# Patient Record
Sex: Female | Born: 1990
Health system: Southern US, Community
[De-identification: ages and names within clinical notes are randomized; demographics above are authoritative.]

## PROBLEM LIST (undated history)

## (undated) ENCOUNTER — Inpatient Hospital Stay: Payer: Self-pay

## (undated) DIAGNOSIS — O24419 Gestational diabetes mellitus in pregnancy, unspecified control: Secondary | ICD-10-CM

## (undated) DIAGNOSIS — I10 Essential (primary) hypertension: Secondary | ICD-10-CM

## (undated) DIAGNOSIS — Z973 Presence of spectacles and contact lenses: Secondary | ICD-10-CM

## (undated) DIAGNOSIS — F329 Major depressive disorder, single episode, unspecified: Secondary | ICD-10-CM

## (undated) DIAGNOSIS — O139 Gestational [pregnancy-induced] hypertension without significant proteinuria, unspecified trimester: Secondary | ICD-10-CM

## (undated) DIAGNOSIS — N201 Calculus of ureter: Secondary | ICD-10-CM

## (undated) DIAGNOSIS — F411 Generalized anxiety disorder: Secondary | ICD-10-CM

## (undated) DIAGNOSIS — F419 Anxiety disorder, unspecified: Secondary | ICD-10-CM

## (undated) DIAGNOSIS — Z8759 Personal history of other complications of pregnancy, childbirth and the puerperium: Secondary | ICD-10-CM

## (undated) DIAGNOSIS — Z87442 Personal history of urinary calculi: Secondary | ICD-10-CM

## (undated) DIAGNOSIS — Z87440 Personal history of urinary (tract) infections: Secondary | ICD-10-CM

## (undated) DIAGNOSIS — Z8619 Personal history of other infectious and parasitic diseases: Secondary | ICD-10-CM

## (undated) DIAGNOSIS — N200 Calculus of kidney: Secondary | ICD-10-CM

## (undated) DIAGNOSIS — E559 Vitamin D deficiency, unspecified: Secondary | ICD-10-CM

## (undated) DIAGNOSIS — G43909 Migraine, unspecified, not intractable, without status migrainosus: Secondary | ICD-10-CM

## (undated) DIAGNOSIS — R1032 Left lower quadrant pain: Secondary | ICD-10-CM

## (undated) HISTORY — DX: Anxiety disorder, unspecified: F41.9

## (undated) HISTORY — DX: Calculus of kidney: N20.0

## (undated) HISTORY — DX: Personal history of urinary (tract) infections: Z87.440

## (undated) HISTORY — DX: Left lower quadrant pain: R10.32

## (undated) HISTORY — DX: Personal history of other infectious and parasitic diseases: Z86.19

---
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
## 2012-01-06 ENCOUNTER — Ambulatory Visit: Payer: Self-pay | Admitting: Unknown Physician Specialty

## 2012-01-17 ENCOUNTER — Ambulatory Visit: Payer: Self-pay | Admitting: Unknown Physician Specialty

## 2012-01-17 HISTORY — PX: CYST EXCISION: SHX5701

## 2012-01-17 LAB — PREGNANCY, URINE: Pregnancy Test, Urine: NEGATIVE m[IU]/mL

## 2013-05-02 IMAGING — CT CT NECK WITH CONTRAST
1 of 2 series · 9 of 14 positions shown, 12 images · non-contrast
Comparison: none

REASON FOR EXAM: neck mass
COMMENTS:

[Series 2: neck 3.0 3 · axial · 0.47mm/px · z∈[+552,+768]mm · 9 of 91 slices shown, 12 images]
[im 10/91  soft-tissue]
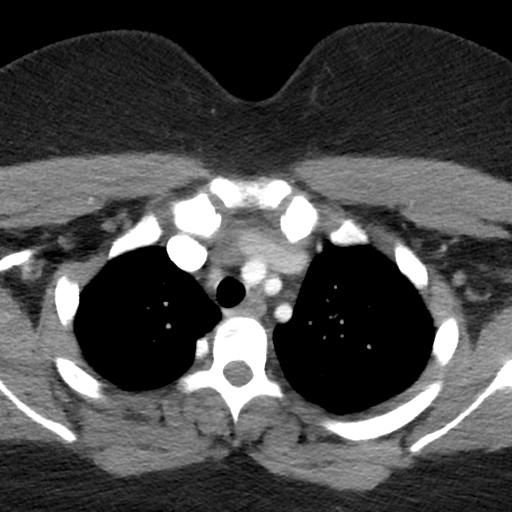
[im 10/91  bone]
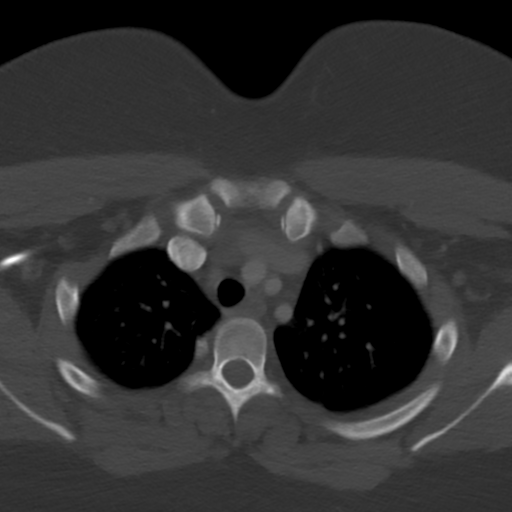
[im 19/91  bone]
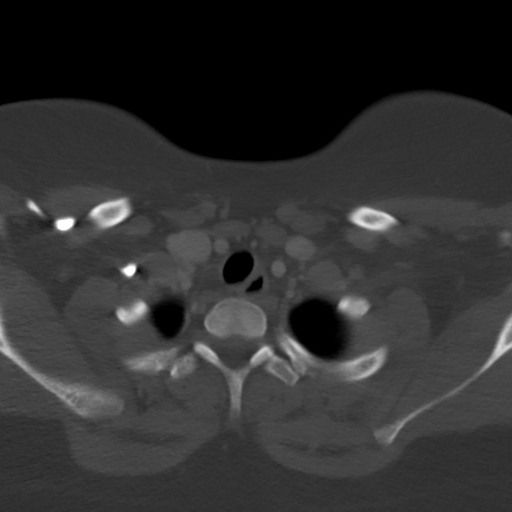
[im 28/91  bone]
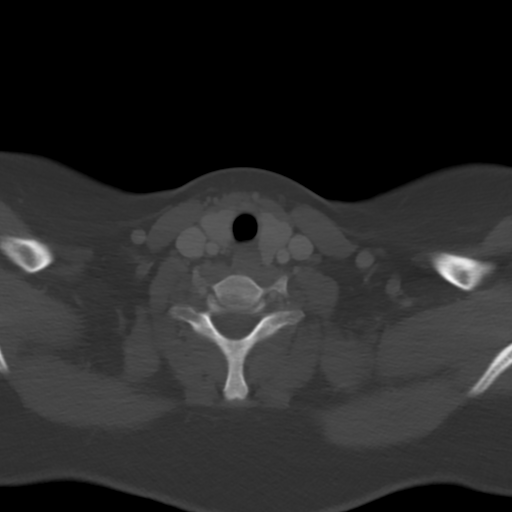
[im 37/91  bone]
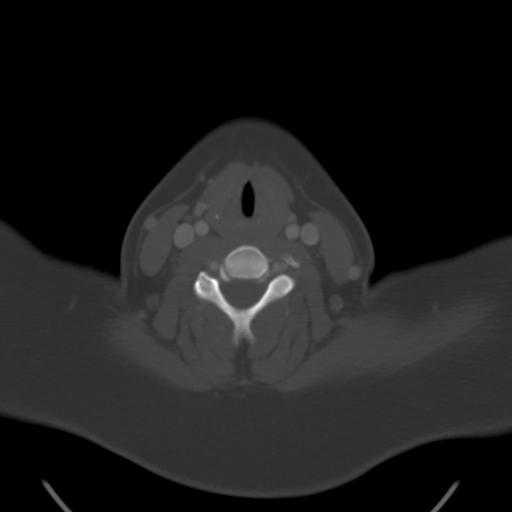
[im 46/91  soft-tissue]
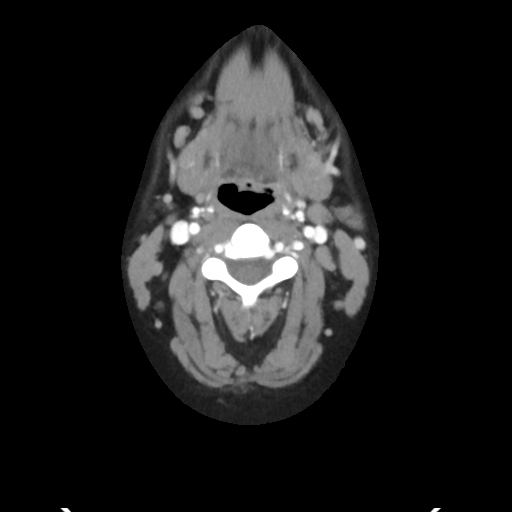
[im 46/91  bone]
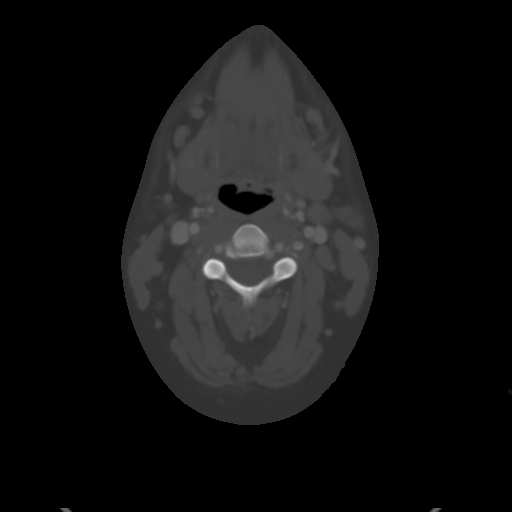
[im 55/91  bone]
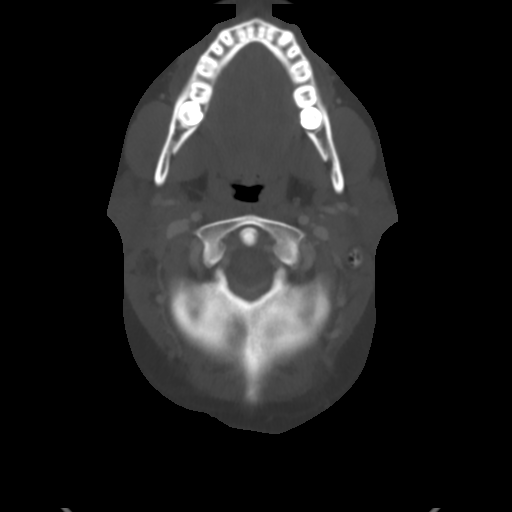
[im 64/91  bone]
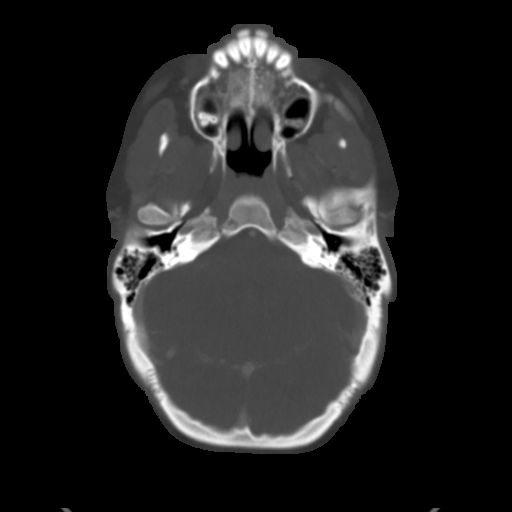
[im 73/91  bone]
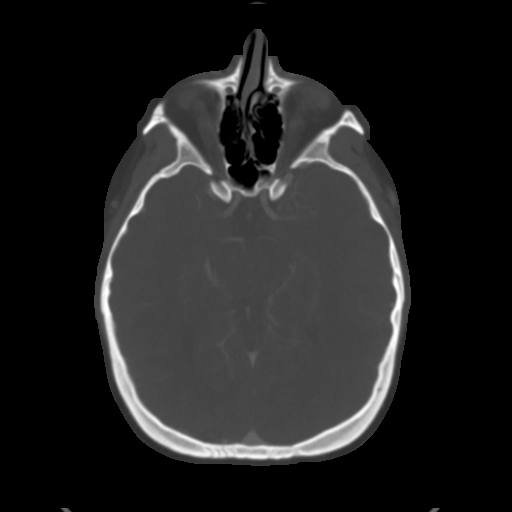
[im 82/91  soft-tissue]
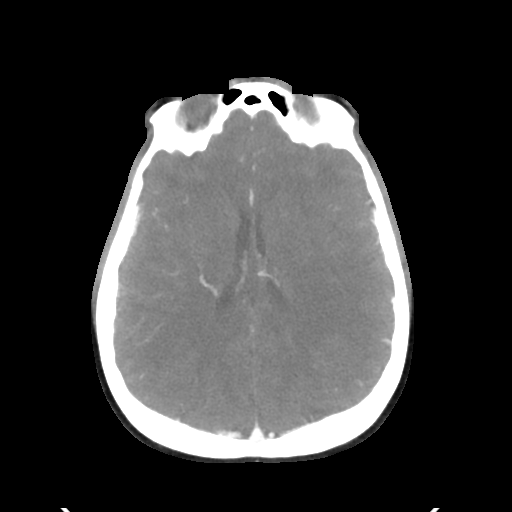
[im 82/91  bone]
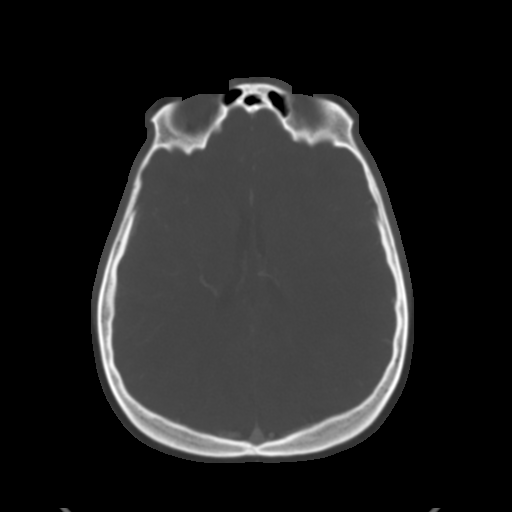

[9 of 14 positions shown; findings below may reference images not displayed]

PROCEDURE:     KCT - KCT NECK WITH CONTRAST  - January 06, 2012  [DATE]

RESULT:     Axial CT scanning was performed through the neck with
reconstructions at 3 mm intervals and slice thicknesses. The patient
received 75 cc of [id number]. Review of multiplanar reconstructed images was
performed separately on the VIA monitor.

A metallic BB has been placed over the palpable nodule of the anterior
aspect of the neck. The nodule exhibit soft tissue density and measures
cm in AP and transverse dimension and measures 1.3 cm in longitudinal
dimension. It is separated from the underlying strap muscles by a fatty
cleft. I do not see large feeding vessels. There is no internal
calcification. It has Hounsfield measurement of 25. It appears to abut the
skin surface over its midportion.

Elsewhere within the neck there are a few normal sized to borderline
enlarged lymph nodes. These are deep to the sternocleidomastoid muscle as
well as anterior and posterior to the jugular and carotid vessels. There are
also borderline enlarged to mildly enlarged submandibular lymph nodes. The
parotid and submandibular glands are normal in appearance. The thyroid lobes
are normal in density and symmetric in size. The laryngeal structures also
are normal in appearance.

At bone window settings the paranasal sinuses as well as nasal passages and
oropharynx are grossly normal. The prevertebral soft tissue spaces appear
normal. The cervical vertebral bodies are preserved in height with
normal-appearing surrounding soft tissues. The pulmonary apices exhibit
patchy increased interstitial densities especially on the left.
IMPRESSION: 1. Anterior to the larynx there is a soft tissue density nodule in the
subcutaneous fat which reaches the skin surface It is separated from the
deeper soft tissues of the neck by a fatty cleft. This likely reflects an
enlarged lymph node. It contains no calcification or gas nor does it appear
to significantly enhance.
2. There are numerous borderline to mildly enlarged cervical and
submandibular lymph nodes.
3. There are patchy interstitial densities within the upper lobes of both
lungs especially on the left.

## 2015-04-19 NOTE — Op Note (Signed)
PATIENT NAME:  Rhonda Davies, Rhonda Davies MR#:  [id number] DATE OF BIRTH:  May 23, 1991  DATE OF PROCEDURE:  01/17/2012  PREOPERATIVE DIAGNOSIS: Anterior epidermal inclusion cyst.   POSTOPERATIVE DIAGNOSIS: Anterior epidermal inclusion cyst.   OPERATION PERFORMED: Excision of anterior neck cyst.   SURGEON: Davina Pokehapman T. Vee Bahe, MD    OPERATIVE FINDINGS: Approximately 2 x 2 cm epidermal inclusion cyst, anterior neck.   DESCRIPTION OF PROCEDURE: [name] was identified in the holding area, taken to the operating room, and placed in the supine position. After general laryngeal mask anesthesia, the neck was gently extended. There was one small pit over the cyst which appeared to be the inclusion or the opening to the skin. A small elliptical incision was made around this pit and on each side. Local anesthetic of 1% lidocaine with 1:100,000 units of epinephrine was then used to inject along this incision line. A total of 1.5 mL was used. With the neck prepped and draped sterilely, a 15 blade was used to incise into the subcutaneous tissues ellipsing out the small center pit. The subcutaneous tissues were then dissected using the microbipolar. This appeared to be an obvious epidermal inclusion cyst as it was attached to the skin. The cyst was dissected in its entirety around the entire cyst down into the subcutaneous fat and it was removed in its entirety. With the cyst removed, the wound was copiously irrigated. The cyst measured approximately 2 x 2 cm. With no active bleeding, the subcutaneous tissues were closed using 4-0 Vicryl and the skin was closed using Dermabond. The patient was then returned to anesthesia where she was extubated in the operating room and taken to the recovery room in stable condition.   CULTURES: None.   SPECIMENS: Epidermal inclusion cyst.   ESTIMATED BLOOD LOSS: Less than 5 mL. ____________________________ Davina Pokehapman T. Mackenze Grandison, MD ctm:drc D: 01/17/2012 09:39:09 ET T: 01/17/2012 10:39:06  ET JOB#: [id number] Sibyl ParrHAPMAN T Kathlean Cinco MD ELECTRONICALLY SIGNED 02/07/2012 12:41

## 2015-05-27 ENCOUNTER — Telehealth: Payer: Self-pay | Admitting: Obstetrics and Gynecology

## 2015-05-27 NOTE — Telephone Encounter (Signed)
PT CALLED AND SHE WAS HERE ABOUT 3 MONTHS AGO FOR THE NEXPLANON AND SHE HAS GAINED OVER 10 POUNDS SINCE THEN AND SHE IS HAVING BAD HEADACHES.Marland Kitchen.HEADACHES STARTED ABOUT 2 WEEKS AGO.swelling of the ankles BAD SHE CANT GO TO WORK..DOESNT KNOW IF THIS IS RELATED TO THE NEXPLANON OR NOT AND WANTED TO KNOW IF SHE NEEDED TO COME.the patient WOULD LIKE A CALL BACK [phone]

## 2015-05-28 NOTE — Telephone Encounter (Signed)
LM for pt informing her that the symptoms she is experiencing do not sound typical of a Nexplanon especially the ankle swelling. Advised pt that I would recommend her seeing PCP for the swelling. However advised pt to call back here to the office for further discussion.

## 2015-06-05 ENCOUNTER — Encounter: Payer: Self-pay | Admitting: Obstetrics and Gynecology

## 2015-06-05 ENCOUNTER — Ambulatory Visit (INDEPENDENT_AMBULATORY_CARE_PROVIDER_SITE_OTHER): Payer: 59 | Admitting: Obstetrics and Gynecology

## 2015-06-05 VITALS — BP 106/73 | HR 97 | Ht 64.0 in | Wt 210.7 lb

## 2015-06-05 DIAGNOSIS — R638 Other symptoms and signs concerning food and fluid intake: Secondary | ICD-10-CM | POA: Diagnosis not present

## 2015-06-05 DIAGNOSIS — Z3042 Encounter for surveillance of injectable contraceptive: Secondary | ICD-10-CM

## 2015-06-05 DIAGNOSIS — N938 Other specified abnormal uterine and vaginal bleeding: Secondary | ICD-10-CM | POA: Insufficient documentation

## 2015-06-05 NOTE — Progress Notes (Signed)
Patient ID: Rhonda Davies, female   DOB: November 28, 1991, 24 y.o.   MRN: [id number]   PT C/O OF H/A AND 15 LB WT GAIN SINCE NEXPLANON WAS INSERTED 02/2015. WANTS IT REMOVED TODAY. NO BIRTH CONTROL DESIRED AT THIS TIME.   GYN ENCOUNTER NOTE  Subjective:       Rhonda Davies is a 24 y.o. No obstetric history on file. female is here for gynecologic evaluation of the following issues:  1.  Medication side effects. 2.  Dysfunctional uterine bleeding. 3.  Contraception  The patient desires Nexplanon removal today.  She has had exacerbation of her migraines and development of dysfunctional uterine bleeding.  The patient is not interested in any active contraceptive method at this time; she plans on abstaining at present.    Gynecologic History Patient's last menstrual period was 03/13/2015 (approximate). Contraception: Nexplanon  Obstetric History OB History  No data available    History reviewed. No pertinent past medical history.  Past Surgical History  Procedure Laterality Date  . Tonsillectomy and adenoidectomy  2007  . Cyst removed  2013    IN NECK    No current outpatient prescriptions on file prior to visit.   No current facility-administered medications on file prior to visit.    Allergies  Allergen Reactions  . Benzonatate Swelling    History   Social History  . Marital Status: Single    Spouse Name: N/A  . Number of Children: N/A  . Years of Education: N/A   Occupational History  . Not on file.   Social History Main Topics  . Smoking status: Never Smoker   . Smokeless tobacco: Not on file  . Alcohol Use: 0.6 oz/week    1 Standard drinks or equivalent per week  . Drug Use: No  . Sexual Activity:    Partners: Male   Other Topics Concern  . Not on file   Social History Narrative  . No narrative on file    Family History  Problem Relation Age of Onset  . Heart disease Father   . Diabetes Maternal Aunt   . Heart disease Maternal Grandfather   .  Diabetes Maternal Grandfather   . Diabetes Paternal Grandmother   . Heart disease Paternal Grandfather   . Breast cancer Neg Hx   . Colon cancer Neg Hx   . Ovarian cancer Neg Hx     The following portions of the patient's history were reviewed and updated as appropriate: allergies, current medications, past family history, past medical history, past social history, past surgical history and problem list.  Review of Systems Review of Systems - General ROS: positive for  - worsening migraine headaches Review of Systems - General ROS: negative for - chills, fatigue, fever, hot flashes, malaise or night sweats Hematological and Lymphatic ROS: negative for  swollen lymph nodes Gastrointestinal ROS: negative for - abdominal pain, blood in stools, change in bowel habits and nausea/vomiting Musculoskeletal ROS: negative for - joint pain, muscle pain or muscular weakness Genito-Urinary ROS: negative for dysmenorrhea, dyspareunia, dysuria, genital discharge, genital ulcers, hematuria, incontinencenocturia or pelvic pain; POSITIVE for irregular bleeding.  Objective:   BP 106/73 mmHg  Pulse 97  Ht 5\' 4"  (1.626 m)  Wt 210 lb 11.2 oz (95.573 kg)  BMI 36.15 kg/m2  LMP 03/13/2015 (Approximate) CONSTITUTIONAL: Well-developed, well-nourished female in no acute distress.  HENT:  Normocephalic, atraumatic.   SKIN: Skin is warm and dry. No rash noted. Not diaphoretic. No erythema. No pallor. NEUROLGIC: Alert and  oriented to person, place, and time. PSYCHIATRIC: Normal mood and affect. Normal behavior. Normal judgment and thought content. CARDIOVASCULAR:Not Examined RESPIRATORY: Not Examined BREASTS: Not Examined ABDOMEN: Soft, non distended; Non tender.  No Organomegaly. PELVIC: Not done. MUSCULOSKELETAL: Normal range of motion. No tenderness.  No cyanosis, clubbing, or edema.  PROCEDURE: NEXPLANON Removal - The patient with was counseled regarding the procedure and gives informed consent.The left  arm is flexed and extended on the exam table.  Betadine prep of the medial surface of the arm completed.  1% lidocaine local anesthetic is infiltrated at the palpable Rod site. A 5 mm incision is made at the distal end of the rod margin.  Hemostat is used to grasp the Nexplanon device.Scar tissue is taken down with the scalpel followed by removal of the Nexplanon.  Minimal bleeding is encountered.  Telfa and pressure dressing is applied for 24 hours.  Wound care instructions are given.  EBL minimal.  Complications none.    Assessment:   1. Increased BMI  2. Dysfunctional uterine bleeding  3.  Encounter for contraception management      Plan:  1. Nexplanon is removed - See Procedure note. 2. Tylenol/Advil prn. 3. Barrier method of contraception is encouraged. 4. Local wound care instructions given. 5. Return in 4 weeks for f/u with Dr Valentino Saxon.

## 2015-06-05 NOTE — Patient Instructions (Addendum)
Plan: 1.  Follow-up in 4 weeks with Dr. Valentino Saxon regarding contraception and DUB 2.  Keep the arm wrapped and covered 24 hours. 3.  Tylenol/Advil as needed for discomfort.

## 2015-11-09 ENCOUNTER — Emergency Department
Admission: EM | Admit: 2015-11-09 | Discharge: 2015-11-09 | Disposition: A | Discharge status: Home / Self Care | Payer: 59 | Attending: Emergency Medicine | Admitting: Emergency Medicine

## 2015-11-09 ENCOUNTER — Encounter: Payer: Self-pay | Admitting: Emergency Medicine

## 2015-11-09 DIAGNOSIS — R112 Nausea with vomiting, unspecified: Secondary | ICD-10-CM | POA: Diagnosis present

## 2015-11-09 DIAGNOSIS — Z3202 Encounter for pregnancy test, result negative: Secondary | ICD-10-CM | POA: Insufficient documentation

## 2015-11-09 DIAGNOSIS — R197 Diarrhea, unspecified: Secondary | ICD-10-CM | POA: Diagnosis not present

## 2015-11-09 DIAGNOSIS — M545 Low back pain: Secondary | ICD-10-CM | POA: Insufficient documentation

## 2015-11-09 DIAGNOSIS — R42 Dizziness and giddiness: Secondary | ICD-10-CM | POA: Diagnosis not present

## 2015-11-09 LAB — POCT PREGNANCY, URINE: Preg Test, Ur: NEGATIVE

## 2015-11-09 LAB — CBC WITH DIFFERENTIAL/PLATELET
BASOS ABS: 0 10*3/uL (ref 0–0.1)
Eosinophils Absolute: 0.1 10*3/uL (ref 0–0.7)
Eosinophils Relative: 1 %
HEMATOCRIT: 44 % (ref 35.0–47.0)
HEMOGLOBIN: 15.1 g/dL (ref 12.0–16.0)
Lymphocytes Relative: 20 %
Lymphs Abs: 1.4 10*3/uL (ref 1.0–3.6)
MCH: 29.7 pg (ref 26.0–34.0)
MCHC: 34.4 g/dL (ref 32.0–36.0)
MCV: 86.5 fL (ref 80.0–100.0)
Monocytes Absolute: 0.6 10*3/uL (ref 0.2–0.9)
NEUTROS ABS: 5.1 10*3/uL (ref 1.4–6.5)
Platelets: 242 10*3/uL (ref 150–440)
RBC: 5.09 MIL/uL (ref 3.80–5.20)
RDW: 12.8 % (ref 11.5–14.5)
WBC: 7.3 10*3/uL (ref 3.6–11.0)

## 2015-11-09 LAB — COMPREHENSIVE METABOLIC PANEL
ALBUMIN: 4.4 g/dL (ref 3.5–5.0)
ALT: 22 U/L (ref 14–54)
AST: 23 U/L (ref 15–41)
Alkaline Phosphatase: 65 U/L (ref 38–126)
Anion gap: 6 (ref 5–15)
BILIRUBIN TOTAL: 1 mg/dL (ref 0.3–1.2)
BUN: 13 mg/dL (ref 6–20)
CALCIUM: 9.2 mg/dL (ref 8.9–10.3)
CHLORIDE: 108 mmol/L (ref 101–111)
CO2: 26 mmol/L (ref 22–32)
Creatinine, Ser: 0.74 mg/dL (ref 0.44–1.00)
GFR calc Af Amer: >60 mL/min (ref >60)
GLUCOSE: 100 mg/dL — AB (ref 65–99)
Potassium: 3.8 mmol/L (ref 3.5–5.1)
Sodium: 140 mmol/L (ref 135–145)
Total Protein: 7.7 g/dL (ref 6.5–8.1)

## 2015-11-09 LAB — URINALYSIS COMPLETE WITH MICROSCOPIC (ARMC ONLY)
BILIRUBIN URINE: NEGATIVE
Glucose, UA: NEGATIVE mg/dL
HGB URINE DIPSTICK: NEGATIVE
KETONES UR: NEGATIVE mg/dL
NITRITE: NEGATIVE
PH: 6 (ref 5.0–8.0)
Protein, ur: 30 mg/dL — AB
SPECIFIC GRAVITY, URINE: 1.032 — AB (ref 1.005–1.030)

## 2015-11-09 LAB — LIPASE, BLOOD: Lipase: 23 U/L (ref 11–51)

## 2015-11-09 MED ORDER — ONDANSETRON 4 MG PO TBDP
4.0000 mg | ORAL_TABLET | Freq: Once | ORAL | Status: AC
  Administered 2015-11-09: 4 mg via ORAL
  Filled 2015-11-09: qty 1

## 2015-11-09 MED ORDER — SODIUM CHLORIDE 0.9 % IV SOLN
1000.0000 mL | Freq: Once | INTRAVENOUS | Status: AC
  Administered 2015-11-09: 1000 mL via INTRAVENOUS

## 2015-11-09 MED ORDER — ONDANSETRON HCL 4 MG PO TABS: 4.0000 mg | ORAL_TABLET | Freq: Every day | ORAL | Status: DC | PRN

## 2015-11-09 MED ORDER — ONDANSETRON HCL 4 MG/2ML IJ SOLN
4.0000 mg | Freq: Once | INTRAMUSCULAR | Status: AC
  Administered 2015-11-09: 4 mg via INTRAVENOUS
  Filled 2015-11-09: qty 2

## 2015-11-09 MED ORDER — ONDANSETRON HCL 4 MG/2ML IJ SOLN
4.0000 mg | Freq: Once | INTRAMUSCULAR | Status: DC
  Filled 2015-11-09: qty 2

## 2015-11-09 NOTE — ED Notes (Signed)
Attempt IV x 2 unsuccessful, able to draw blood but not advance catheter. Zofran given po.

## 2015-11-09 NOTE — ED Provider Notes (Signed)
[facility] Emergency Department Provider Note  ____________________________________________  Time seen: Approximately 1220 PM  I have reviewed the triage vital signs and the nursing notes.   HISTORY  Chief Complaint Emesis    HPI Rhonda Davies is a 24 y.o. female without any chronic medical issues who is presenting today with 3 days of nausea vomiting and diarrhea. She says that she has had about 10-15 episodes of vomiting and diarrhea per day. She describes the character of both as clear. She denies any blood in the diarrhea or the vomit. Also denies any bilious vomiting. She says that she has had some intermittent cramping lower back pain. Denies any abdominal pain. Denies any dysuria or urinary frequency. 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 Denies any cough or runny nose or ear pain. Originally went to urgent care earlier today but they sent her directly to the emergency department. Patient also experiencing some dizziness with standing.She denies any recent antibiotics or hospitalizations.   History reviewed. No pertinent past medical history.  Patient Active Problem List   Diagnosis Date Noted  . Increased BMI 06/05/2015  . Dysfunctional uterine bleeding 06/05/2015  . Encounter for surveillance of injectable contraceptive 06/05/2015    Past Surgical History  Procedure Laterality Date  . Tonsillectomy and adenoidectomy  2007  . Cyst removed  2013    IN NECK    No current outpatient prescriptions on file.  Allergies Benzonatate  Family History  Problem Relation Age of Onset  . Heart disease Father   . Diabetes Maternal Aunt   . Heart disease Maternal Grandfather   . Diabetes Maternal Grandfather   . Diabetes Paternal Grandmother   . Heart disease Paternal Grandfather   . Breast cancer Neg Hx   . Colon cancer Neg Hx   . Ovarian cancer Neg Hx     Social History Social History   Substance Use Topics  . Smoking status: Never Smoker   . Smokeless tobacco: None  . Alcohol Use: 0.6 oz/week    1 Standard drinks or equivalent per week    Review of Systems Constitutional: No fever/chills Eyes: No visual changes. ENT: No sore throat. Cardiovascular: Denies chest pain. Respiratory: Denies shortness of breath. Gastrointestinal: No abdominal pain.    No constipation. Genitourinary: Negative for dysuria. Musculoskeletal: As above  Skin: Negative for rash. Neurological: Negative for headaches, focal weakness or numbness.  10-point ROS otherwise negative.  ____________________________________________   PHYSICAL EXAM:  VITAL SIGNS: ED Triage Vitals  Enc Vitals Group     BP 11/09/15 1123 122/73 mmHg     Pulse Rate 11/09/15 1123 85     Resp 11/09/15 1123 20     Temp 11/09/15 1123 98.4 F (36.9 C)     Temp Source 11/09/15 1123 Oral     SpO2 11/09/15 1123 98 %     Weight 11/09/15 1123 206 lb (93.441 kg)     Height 11/09/15 1123  (1.651 m)     Head Cir --      Peak Flow --      Pain Score 11/09/15 1134 6     Pain Loc --      Pain Edu? --      Excl. in GC? --     Constitutional: Alert and oriented. Well appearing and in no acute distress. Eyes: Conjunctivae are normal. PERRL. EOMI. Head: Atraumatic. Nose: No congestion/rhinnorhea. Mouth/Throat: Mucous membranes are moist.  Oropharynx non-erythematous. Neck: No stridor.   Cardiovascular: Normal rate, regular rhythm. Grossly normal heart sounds.  Good peripheral circulation. Respiratory: Normal respiratory effort.  No retractions. Lungs CTAB. Gastrointestinal: Soft and nontender. No distention. No abdominal bruits. No CVA tenderness. Musculoskeletal: No lower extremity tenderness nor edema.  No joint effusions. Tenderness to the bilateral, lateral lumbar region. No midline tenderness or step-off. Neurologic:  Normal speech and language. No gross focal neurologic deficits are appreciated. No gait  instability. Skin:  Skin is warm, dry and intact. No rash noted. Psychiatric: Mood and affect are normal. Speech and behavior are normal.  ____________________________________________   LABS (all labs ordered are listed, but only abnormal results are displayed)  Labs Reviewed  COMPREHENSIVE METABOLIC PANEL - Abnormal; Notable for the following:    Glucose, Bld 100 (*)    All other components within normal limits  URINALYSIS COMPLETEWITH MICROSCOPIC ([facility] ONLY) - Abnormal; Notable for the following:    Color, Urine YELLOW (*)    APPearance CLOUDY (*)    Specific Gravity, Urine 1.032 (*)    Protein, ur 30 (*)    Leukocytes, UA 2+ (*)    Bacteria, UA RARE (*)    Squamous Epithelial / LPF 6-30 (*)    All other components within normal limits  URINE CULTURE  CBC WITH DIFFERENTIAL/PLATELET  LIPASE, BLOOD  POC URINE PREG, ED  POCT PREGNANCY, URINE   ____________________________________________  EKG   ____________________________________________  RADIOLOGY   ____________________________________________   PROCEDURES    ____________________________________________   INITIAL IMPRESSION / ASSESSMENT AND PLAN / ED COURSE  Pertinent labs & imaging results that were available during my care of the patient were reviewed by me and considered in my medical decision making (see chart for details).  ----------------------------------------- 3:00 PM on 11/09/2015 -----------------------------------------  After fluids and Zofran the patient is no longer dizzy and is tolerating crackers as well as by mouth fluids. The patient denied any dysuria and there are squamous epithelial cells in the urine. It is possible this is a contaminated sample. I will discharge the patient home. Likely diagnosis is a viral illness. 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 We will discharge with Zofran. We'll give phone number for primary care follow-up. Patient  understands return if any worsening or concerning symptoms. ____________________________________________   FINAL CLINICAL IMPRESSION(S) / ED DIAGNOSES  Nausea and vomiting and diarrhea.    Myrna Blazeravid Matthew Lavern Maslow, MD 11/09/15 [phone]

## 2015-11-09 NOTE — Discharge Instructions (Signed)

## 2015-11-09 NOTE — ED Notes (Signed)
States watery diarrhea, not even keeping fluids down

## 2015-11-09 NOTE — ED Notes (Signed)
Pt given ginger ale as a PO challenge after administration of ondansetron intravaneously.  Pt seems to be tolerating liquids well at the moment and admits that the ondansetron has helped her stomach.  Denies needs at this time and she appears comfortable.

## 2015-12-27 NOTE — L&D Delivery Note (Signed)
Delivery Summary for Rhonda Davies  Labor Events:   Preterm labor:   Rupture date:   Rupture time:   Rupture type: Artificial  Fluid Color: Bloody  Induction:   Augmentation:   Complications:   Cervical ripening:          Delivery:   Episiotomy:   Lacerations:   Repair suture:   Repair # of packets:   Blood loss (ml): 75   Information for the patient's newborn:  Rich NumberJenkins, Girl Jalexa [[id number]][[id number]]    Delivery 10/30/2016 7:15 AM by  Vaginal, Spontaneous Delivery Sex:  female Gestational Age: 1773w1d Delivery Clinician:   Living?:         APGARS  One minute Five minutes Ten minutes  Skin color:        Heart rate:        Grimace:        Muscle tone:        Breathing:        Totals: 2  7  9     Presentation/position:      Resuscitation:   Cord information:    Disposition of cord blood:     Blood gases sent?  Complications:   Placenta: Delivered:       appearance Newborn Measurements: Weight: 4 lb 14.7 oz (2230 g)  Height: 18.5"  Head circumference:    Chest circumference:    Other providers:    Additional  information: Forceps:   Vacuum:   Breech:   Observed anomalies       Delivery Note At 7:15 AM a viable and healthy female was delivered precipitously via Vaginal, Spontaneous Delivery (Presentation: Vertex).  APGAR: 2, 7; weight 4 lb 14.7 oz (2230 g).   Placenta status: spontaneously removed, intact.  Cord: 3-vessel with the following complications: none.  Cord pH: not obtained  Anesthesia:  Epidural Episiotomy: None Lacerations: None Suture Repair: None Est. Blood Loss (mL):  75  Mom to postpartum.  Baby to Couplet care / Skin to Skin.  Hildred LaserAnika Rudy Luhmann, MD [facility] 10/30/2016, 7:57 AM

## 2016-01-20 ENCOUNTER — Ambulatory Visit (INDEPENDENT_AMBULATORY_CARE_PROVIDER_SITE_OTHER): Payer: 59 | Admitting: Obstetrics and Gynecology

## 2016-01-20 ENCOUNTER — Encounter: Payer: Self-pay | Admitting: Obstetrics and Gynecology

## 2016-01-20 VITALS — BP 116/80 | HR 86 | Ht 64.0 in | Wt 218.3 lb

## 2016-01-20 DIAGNOSIS — Z01419 Encounter for gynecological examination (general) (routine) without abnormal findings: Secondary | ICD-10-CM

## 2016-01-20 DIAGNOSIS — Z113 Encounter for screening for infections with a predominantly sexual mode of transmission: Secondary | ICD-10-CM

## 2016-01-20 DIAGNOSIS — R102 Pelvic and perineal pain unspecified side: Secondary | ICD-10-CM

## 2016-01-20 DIAGNOSIS — E669 Obesity, unspecified: Secondary | ICD-10-CM | POA: Diagnosis not present

## 2016-01-20 MED ORDER — LIDOCAINE HCL 2 % EX GEL: 1.0000 "application " | CUTANEOUS | Status: DC | PRN

## 2016-01-20 NOTE — Patient Instructions (Signed)
Preventive Care for Adults, Female A healthy lifestyle and preventive care can promote health and wellness. Preventive health guidelines for women include the following key practices.  A routine yearly physical is a good way to check with your health care provider about your health and preventive screening. It is a chance to share any concerns and updates on your health and to receive a thorough exam.  Visit your dentist for a routine exam and preventive care every 6 months. Brush your teeth twice a day and floss once a day. Good oral hygiene prevents tooth decay and gum disease.  The frequency of eye exams is based on your age, health, family medical history, use of contact lenses, and other factors. Follow your health care provider's recommendations for frequency of eye exams.  Eat a healthy diet. Foods like vegetables, fruits, whole grains, low-fat dairy products, and lean protein foods contain the nutrients you need without too many calories. Decrease your intake of foods high in solid fats, added sugars, and salt. Eat the right amount of calories for you.Get information about a proper diet from your health care provider, if necessary.  Regular physical exercise is one of the most important things you can do for your health. Most adults should get at least 150 minutes of moderate-intensity exercise (any activity that increases your heart rate and causes you to sweat) each week. In addition, most adults need muscle-strengthening exercises on 2 or more days a week.  Maintain a healthy weight. The body mass index (BMI) is a screening tool to identify possible weight problems. It provides an estimate of body fat based on height and weight. Your health care provider can find your BMI and can help you achieve or maintain a healthy weight.For adults 20 years and older:  A BMI below 18.5 is considered underweight.  A BMI of 18.5 to 24.9 is normal.  A BMI of 25 to 29.9 is considered  overweight.  A BMI of 30 and above is considered obese.  Maintain normal blood lipids and cholesterol levels by exercising and minimizing your intake of saturated fat. Eat a balanced diet with plenty of fruit and vegetables. Blood tests for lipids and cholesterol should begin at age 64 and be repeated every 5 years. If your lipid or cholesterol levels are high, you are over 50, or you are at high risk for heart disease, you may need your cholesterol levels checked more frequently.Ongoing high lipid and cholesterol levels should be treated with medicines if diet and exercise are not working.  If you smoke, find out from your health care provider how to quit. If you do not use tobacco, do not start.  Lung cancer screening is recommended for adults aged 52-80 years who are at high risk for developing lung cancer because of a history of smoking. A yearly low-dose CT scan of the lungs is recommended for people who have at least a 30-pack-year history of smoking and are a current smoker or have quit within the past 15 years. A pack year of smoking is smoking an average of 1 pack of cigarettes a day for 1 year (for example: 1 pack a day for 30 years or 2 packs a day for 15 years). Yearly screening should continue until the smoker has stopped smoking for at least 15 years. Yearly screening should be stopped for people who develop a health problem that would prevent them from having lung cancer treatment.  If you are pregnant, do not drink alcohol. If you are  breastfeeding, be very cautious about drinking alcohol. If you are not pregnant and choose to drink alcohol, do not have more than 1 drink per day. One drink is considered to be 12 ounces (355 mL) of beer, 5 ounces (148 mL) of wine, or 1.5 ounces (44 mL) of liquor.  Avoid use of street drugs. Do not share needles with anyone. Ask for help if you need support or instructions about stopping the use of drugs.  High blood pressure causes heart disease and  increases the risk of stroke. Your blood pressure should be checked at least every 1 to 2 years. Ongoing high blood pressure should be treated with medicines if weight loss and exercise do not work.  If you are 25-78 years old, ask your health care provider if you should take aspirin to prevent strokes.  Diabetes screening is done by taking a blood sample to check your blood glucose level after you have not eaten for a certain period of time (fasting). If you are not overweight and you do not have risk factors for diabetes, you should be screened once every 3 years starting at age 86. If you are overweight or obese and you are 3-87 years of age, you should be screened for diabetes every year as part of your cardiovascular risk assessment.  Breast cancer screening is essential preventive care for women. You should practice "breast self-awareness." This means understanding the normal appearance and feel of your breasts and may include breast self-examination. Any changes detected, no matter how small, should be reported to a health care provider. Women in their 66s and 30s should have a clinical breast exam (CBE) by a health care provider as part of a regular health exam every 1 to 3 years. After age 43, women should have a CBE every year. Starting at age 37, women should consider having a mammogram (breast X-ray test) every year. Women who have a family history of breast cancer should talk to their health care provider about genetic screening. Women at a high risk of breast cancer should talk to their health care providers about having an MRI and a mammogram every year.  Breast cancer gene (BRCA)-related cancer risk assessment is recommended for women who have family members with BRCA-related cancers. BRCA-related cancers include breast, ovarian, tubal, and peritoneal cancers. Having family members with these cancers may be associated with an increased risk for harmful changes (mutations) in the breast  cancer genes BRCA1 and BRCA2. Results of the assessment will determine the need for genetic counseling and BRCA1 and BRCA2 testing.  Your health care provider may recommend that you be screened regularly for cancer of the pelvic organs (ovaries, uterus, and vagina). This screening involves a pelvic examination, including checking for microscopic changes to the surface of your cervix (Pap test). You may be encouraged to have this screening done every 3 years, beginning at age 78.  For women ages 79-65, health care providers may recommend pelvic exams and Pap testing every 3 years, or they may recommend the Pap and pelvic exam, combined with testing for human papilloma virus (HPV), every 5 years. Some types of HPV increase your risk of cervical cancer. Testing for HPV may also be done on women of any age with unclear Pap test results.  Other health care providers may not recommend any screening for nonpregnant women who are considered low risk for pelvic cancer and who do not have symptoms. Ask your health care provider if a screening pelvic exam is right for  you.  If you have had past treatment for cervical cancer or a condition that could lead to cancer, you need Pap tests and screening for cancer for at least 20 years after your treatment. If Pap tests have been discontinued, your risk factors (such as having a new sexual partner) need to be reassessed to determine if screening should resume. Some women have medical problems that increase the chance of getting cervical cancer. In these cases, your health care provider may recommend more frequent screening and Pap tests.  Colorectal cancer can be detected and often prevented. Most routine colorectal cancer screening begins at the age of 50 years and continues through age 75 years. However, your health care provider may recommend screening at an earlier age if you have risk factors for colon cancer. On a yearly basis, your health care provider may provide  home test kits to check for hidden blood in the stool. Use of a small camera at the end of a tube, to directly examine the colon (sigmoidoscopy or colonoscopy), can detect the earliest forms of colorectal cancer. Talk to your health care provider about this at age 50, when routine screening begins. Direct exam of the colon should be repeated every 5-10 years through age 75 years, unless early forms of precancerous polyps or small growths are found.  People who are at an increased risk for hepatitis B should be screened for this virus. You are considered at high risk for hepatitis B if:  You were born in a country where hepatitis B occurs often. Talk with your health care provider about which countries are considered high risk.  Your parents were born in a high-risk country and you have not received a shot to protect against hepatitis B (hepatitis B vaccine).  You have HIV or AIDS.  You use needles to inject street drugs.  You live with, or have sex with, someone who has hepatitis B.  You get hemodialysis treatment.  You take certain medicines for conditions like cancer, organ transplantation, and autoimmune conditions.  Hepatitis C blood testing is recommended for all people born from 1945 through 1965 and any individual with known risks for hepatitis C.  Practice safe sex. Use condoms and avoid high-risk sexual practices to reduce the spread of sexually transmitted infections (STIs). STIs include gonorrhea, chlamydia, syphilis, trichomonas, herpes, HPV, and human immunodeficiency virus (HIV). Herpes, HIV, and HPV are viral illnesses that have no cure. They can result in disability, cancer, and death.  You should be screened for sexually transmitted illnesses (STIs) including gonorrhea and chlamydia if:  You are sexually active and are younger than 24 years.  You are older than 24 years and your health care provider tells you that you are at risk for this type of infection.  Your sexual  activity has changed since you were last screened and you are at an increased risk for chlamydia or gonorrhea. Ask your health care provider if you are at risk.  If you are at risk of being infected with HIV, it is recommended that you take a prescription medicine daily to prevent HIV infection. This is called preexposure prophylaxis (PrEP). You are considered at risk if:  You are sexually active and do not regularly use condoms or know the HIV status of your partner(s).  You take drugs by injection.  You are sexually active with a partner who has HIV.  Talk with your health care provider about whether you are at high risk of being infected with HIV. If   you choose to begin PrEP, you should first be tested for HIV. You should then be tested every 3 months for as long as you are taking PrEP.  Osteoporosis is a disease in which the bones lose minerals and strength with aging. This can result in serious bone fractures or breaks. The risk of osteoporosis can be identified using a bone density scan. Women ages 1 years and over and women at risk for fractures or osteoporosis should discuss screening with their health care providers. Ask your health care provider whether you should take a calcium supplement or vitamin D to reduce the rate of osteoporosis.  Menopause can be associated with physical symptoms and risks. Hormone replacement therapy is available to decrease symptoms and risks. You should talk to your health care provider about whether hormone replacement therapy is right for you.  Use sunscreen. Apply sunscreen liberally and repeatedly throughout the day. You should seek shade when your shadow is shorter than you. Protect yourself by wearing long sleeves, pants, a wide-brimmed hat, and sunglasses year round, whenever you are outdoors.  Once a month, do a whole body skin exam, using a mirror to look at the skin on your back. Tell your health care provider of new moles, moles that have irregular  borders, moles that are larger than a pencil eraser, or moles that have changed in shape or color.  Stay current with required vaccines (immunizations).  Influenza vaccine. All adults should be immunized every year.  Tetanus, diphtheria, and acellular pertussis (Td, Tdap) vaccine. Pregnant women should receive 1 dose of Tdap vaccine during each pregnancy. The dose should be obtained regardless of the length of time since the last dose. Immunization is preferred during the 27th-36th week of gestation. An adult who has not previously received Tdap or who does not know her vaccine status should receive 1 dose of Tdap. This initial dose should be followed by tetanus and diphtheria toxoids (Td) booster doses every 10 years. Adults with an unknown or incomplete history of completing a 3-dose immunization series with Td-containing vaccines should begin or complete a primary immunization series including a Tdap dose. Adults should receive a Td booster every 10 years.  Varicella vaccine. An adult without evidence of immunity to varicella should receive 2 doses or a second dose if she has previously received 1 dose. Pregnant females who do not have evidence of immunity should receive the first dose after pregnancy. This first dose should be obtained before leaving the health care facility. The second dose should be obtained 4-8 weeks after the first dose.  Human papillomavirus (HPV) vaccine. Females aged 13-26 years who have not received the vaccine previously should obtain the 3-dose series. The vaccine is not recommended for use in pregnant females. However, pregnancy testing is not needed before receiving a dose. If a female is found to be pregnant after receiving a dose, no treatment is needed. In that case, the remaining doses should be delayed until after the pregnancy. Immunization is recommended for any person with an immunocompromised condition through the age of 24 years if she did not get any or all doses  earlier. During the 3-dose series, the second dose should be obtained 4-8 weeks after the first dose. The third dose should be obtained 24 weeks after the first dose and 16 weeks after the second dose.  Zoster vaccine. One dose is recommended for adults aged 97 years or older unless certain conditions are present.  Measles, mumps, and rubella (MMR) vaccine. Adults born  before 1957 generally are considered immune to measles and mumps. Adults born in [birth year] or later should have 1 or more doses of MMR vaccine unless there is a contraindication to the vaccine or there is laboratory evidence of immunity to each of the three diseases. A routine second dose of MMR vaccine should be obtained at least 28 days after the first dose for students attending postsecondary schools, health care workers, or international travelers. People who received inactivated measles vaccine or an unknown type of measles vaccine during 1963-1967 should receive 2 doses of MMR vaccine. People who received inactivated mumps vaccine or an unknown type of mumps vaccine before 1979 and are at high risk for mumps infection should consider immunization with 2 doses of MMR vaccine. For females of childbearing age, rubella immunity should be determined. If there is no evidence of immunity, females who are not pregnant should be vaccinated. If there is no evidence of immunity, females who are pregnant should delay immunization until after pregnancy. Unvaccinated health care workers born before 60 who lack laboratory evidence of measles, mumps, or rubella immunity or laboratory confirmation of disease should consider measles and mumps immunization with 2 doses of MMR vaccine or rubella immunization with 1 dose of MMR vaccine.  Pneumococcal 13-valent conjugate (PCV13) vaccine. When indicated, a person who is uncertain of his immunization history and has no record of immunization should receive the PCV13 vaccine. All adults 61 years of age and older  should receive this vaccine. An adult aged 92 years or older who has certain medical conditions and has not been previously immunized should receive 1 dose of PCV13 vaccine. This PCV13 should be followed with a dose of pneumococcal polysaccharide (PPSV23) vaccine. Adults who are at high risk for pneumococcal disease should obtain the PPSV23 vaccine at least 8 weeks after the dose of PCV13 vaccine. Adults older than 23 years of age who have normal immune system function should obtain the PPSV23 vaccine dose at least 1 year after the dose of PCV13 vaccine.  Pneumococcal polysaccharide (PPSV23) vaccine. When PCV13 is also indicated, PCV13 should be obtained first. All adults aged 2 years and older should be immunized. An adult younger than age 30 years who has certain medical conditions should be immunized. Any person who resides in a nursing home or long-term care facility should be immunized. An adult smoker should be immunized. People with an immunocompromised condition and certain other conditions should receive both PCV13 and PPSV23 vaccines. People with human immunodeficiency virus (HIV) infection should be immunized as soon as possible after diagnosis. Immunization during chemotherapy or radiation therapy should be avoided. Routine use of PPSV23 vaccine is not recommended for American Indians, [place] Natives, or people younger than 65 years unless there are medical conditions that require PPSV23 vaccine. When indicated, people who have unknown immunization and have no record of immunization should receive PPSV23 vaccine. One-time revaccination 5 years after the first dose of PPSV23 is recommended for people aged 19-64 years who have chronic kidney failure, nephrotic syndrome, asplenia, or immunocompromised conditions. People who received 1-2 doses of PPSV23 before age 44 years should receive another dose of PPSV23 vaccine at age 83 years or later if at least 5 years have passed since the previous dose. Doses  of PPSV23 are not needed for people immunized with PPSV23 at or after age 20 years.  Meningococcal vaccine. Adults with asplenia or persistent complement component deficiencies should receive 2 doses of quadrivalent meningococcal conjugate (MenACWY-D) vaccine. The doses should be obtained  at least 2 months apart. Microbiologists working with certain meningococcal bacteria, [place] recruits, people at risk during an outbreak, and people who travel to or live in countries with a high rate of meningitis should be immunized. A first-year college student up through age 28 years who is living in a residence hall should receive a dose if she did not receive a dose on or after her 16th birthday. Adults who have certain high-risk conditions should receive one or more doses of vaccine.  Hepatitis A vaccine. Adults who wish to be protected from this disease, have certain high-risk conditions, work with hepatitis A-infected animals, work in hepatitis A research labs, or travel to or work in countries with a high rate of hepatitis A should be immunized. Adults who were previously unvaccinated and who anticipate close contact with an international adoptee during the first 60 days after arrival in the [place] from a country with a high rate of hepatitis A should be immunized.  Hepatitis B vaccine. Adults who wish to be protected from this disease, have certain high-risk conditions, may be exposed to blood or other infectious body fluids, are household contacts or sex partners of hepatitis B positive people, are clients or workers in certain care facilities, or travel to or work in countries with a high rate of hepatitis B should be immunized.  Haemophilus influenzae type b (Hib) vaccine. A previously unvaccinated person with asplenia or sickle cell disease or having a scheduled splenectomy should receive 1 dose of Hib vaccine. Regardless of previous immunization, a recipient of a hematopoietic stem cell transplant  should receive a 3-dose series 6-12 months after her successful transplant. Hib vaccine is not recommended for adults with HIV infection. Preventive Services / Frequency Ages 71 to 87 years  Blood pressure check.** / Every 3-5 years.  Lipid and cholesterol check.** / Every 5 years beginning at age 1.  Clinical breast exam.** / Every 3 years for women in their 3s and 31s.  BRCA-related cancer risk assessment.** / For women who have family members with a BRCA-related cancer (breast, ovarian, tubal, or peritoneal cancers).  Pap test.** / Every 2 years from ages 50 through 86. Every 3 years starting at age 87 through age 7 or 75 with a history of 3 consecutive normal Pap tests.  HPV screening.** / Every 3 years from ages 59 through ages 35 to 6 with a history of 3 consecutive normal Pap tests.  Hepatitis C blood test.** / For any individual with known risks for hepatitis C.  Skin self-exam. / Monthly.  Influenza vaccine. / Every year.  Tetanus, diphtheria, and acellular pertussis (Tdap, Td) vaccine.** / Consult your health care provider. Pregnant women should receive 1 dose of Tdap vaccine during each pregnancy. 1 dose of Td every 10 years.  Varicella vaccine.** / Consult your health care provider. Pregnant females who do not have evidence of immunity should receive the first dose after pregnancy.  HPV vaccine. / 3 doses over 6 months, if 72 and younger. The vaccine is not recommended for use in pregnant females. However, pregnancy testing is not needed before receiving a dose.  Measles, mumps, rubella (MMR) vaccine.** / You need at least 1 dose of MMR if you were born in 1957 or later. You may also need a 2nd dose. For females of childbearing age, rubella immunity should be determined. If there is no evidence of immunity, females who are not pregnant should be vaccinated. If there is no evidence of immunity, females who are  pregnant should delay immunization until after  pregnancy.  Pneumococcal 13-valent conjugate (PCV13) vaccine.** / Consult your health care provider.  Pneumococcal polysaccharide (PPSV23) vaccine.** / 1 to 2 doses if you smoke cigarettes or if you have certain conditions.  Meningococcal vaccine.** / 1 dose if you are age 87 to 44 years and a Market researcher living in a residence hall, or have one of several medical conditions, you need to get vaccinated against meningococcal disease. You may also need additional booster doses.  Hepatitis A vaccine.** / Consult your health care provider.  Hepatitis B vaccine.** / Consult your health care provider.  Haemophilus influenzae type b (Hib) vaccine.** / Consult your health care provider. Ages 86 to 38 years  Blood pressure check.** / Every year.  Lipid and cholesterol check.** / Every 5 years beginning at age 49 years.  Lung cancer screening. / Every year if you are aged 71-80 years and have a 30-pack-year history of smoking and currently smoke or have quit within the past 15 years. Yearly screening is stopped once you have quit smoking for at least 15 years or develop a health problem that would prevent you from having lung cancer treatment.  Clinical breast exam.** / Every year after age 51 years.  BRCA-related cancer risk assessment.** / For women who have family members with a BRCA-related cancer (breast, ovarian, tubal, or peritoneal cancers).  Mammogram.** / Every year beginning at age 18 years and continuing for as long as you are in good health. Consult with your health care provider.  Pap test.** / Every 3 years starting at age 63 years through age 37 or 57 years with a history of 3 consecutive normal Pap tests.  HPV screening.** / Every 3 years from ages 41 years through ages 76 to 23 years with a history of 3 consecutive normal Pap tests.  Fecal occult blood test (FOBT) of stool. / Every year beginning at age 36 years and continuing until age 51 years. You may not need  to do this test if you get a colonoscopy every 10 years.  Flexible sigmoidoscopy or colonoscopy.** / Every 5 years for a flexible sigmoidoscopy or every 10 years for a colonoscopy beginning at age 36 years and continuing until age 35 years.  Hepatitis C blood test.** / For all people born from [birth year] through 1965 and any individual with known risks for hepatitis C.  Skin self-exam. / Monthly.  Influenza vaccine. / Every year.  Tetanus, diphtheria, and acellular pertussis (Tdap/Td) vaccine.** / Consult your health care provider. Pregnant women should receive 1 dose of Tdap vaccine during each pregnancy. 1 dose of Td every 10 years.  Varicella vaccine.** / Consult your health care provider. Pregnant females who do not have evidence of immunity should receive the first dose after pregnancy.  Zoster vaccine.** / 1 dose for adults aged 73 years or older.  Measles, mumps, rubella (MMR) vaccine.** / You need at least 1 dose of MMR if you were born in 1957 or later. You may also need a second dose. For females of childbearing age, rubella immunity should be determined. If there is no evidence of immunity, females who are not pregnant should be vaccinated. If there is no evidence of immunity, females who are pregnant should delay immunization until after pregnancy.  Pneumococcal 13-valent conjugate (PCV13) vaccine.** / Consult your health care provider.  Pneumococcal polysaccharide (PPSV23) vaccine.** / 1 to 2 doses if you smoke cigarettes or if you have certain conditions.  Meningococcal vaccine.** /  Consult your health care provider.  Hepatitis A vaccine.** / Consult your health care provider.  Hepatitis B vaccine.** / Consult your health care provider.  Haemophilus influenzae type b (Hib) vaccine.** / Consult your health care provider. Ages 80 years and over  Blood pressure check.** / Every year.  Lipid and cholesterol check.** / Every 5 years beginning at age 62 years.  Lung cancer  screening. / Every year if you are aged 32-80 years and have a 30-pack-year history of smoking and currently smoke or have quit within the past 15 years. Yearly screening is stopped once you have quit smoking for at least 15 years or develop a health problem that would prevent you from having lung cancer treatment.  Clinical breast exam.** / Every year after age 61 years.  BRCA-related cancer risk assessment.** / For women who have family members with a BRCA-related cancer (breast, ovarian, tubal, or peritoneal cancers).  Mammogram.** / Every year beginning at age 39 years and continuing for as long as you are in good health. Consult with your health care provider.  Pap test.** / Every 3 years starting at age 85 years through age 74 or 72 years with 3 consecutive normal Pap tests. Testing can be stopped between 65 and 70 years with 3 consecutive normal Pap tests and no abnormal Pap or HPV tests in the past 10 years.  HPV screening.** / Every 3 years from ages 55 years through ages 67 or 77 years with a history of 3 consecutive normal Pap tests. Testing can be stopped between 65 and 70 years with 3 consecutive normal Pap tests and no abnormal Pap or HPV tests in the past 10 years.  Fecal occult blood test (FOBT) of stool. / Every year beginning at age 81 years and continuing until age 22 years. You may not need to do this test if you get a colonoscopy every 10 years.  Flexible sigmoidoscopy or colonoscopy.** / Every 5 years for a flexible sigmoidoscopy or every 10 years for a colonoscopy beginning at age 67 years and continuing until age 22 years.  Hepatitis C blood test.** / For all people born from [birth year] through 1963 and any individual with known risks for hepatitis C.  Osteoporosis screening.** / A one-time screening for women ages 8 years and over and women at risk for fractures or osteoporosis.  Skin self-exam. / Monthly.  Influenza vaccine. / Every year.  Tetanus, diphtheria, and  acellular pertussis (Tdap/Td) vaccine.** / 1 dose of Td every 10 years.  Varicella vaccine.** / Consult your health care provider.  Zoster vaccine.** / 1 dose for adults aged 56 years or older.  Pneumococcal 13-valent conjugate (PCV13) vaccine.** / Consult your health care provider.  Pneumococcal polysaccharide (PPSV23) vaccine.** / 1 dose for all adults aged 15 years and older.  Meningococcal vaccine.** / Consult your health care provider.  Hepatitis A vaccine.** / Consult your health care provider.  Hepatitis B vaccine.** / Consult your health care provider.  Haemophilus influenzae type b (Hib) vaccine.** / Consult your health care provider. ** Family history and personal history of risk and conditions may change your health care provider's recommendations.   This information is not intended to replace advice given to you by your health care provider. Make sure you discuss any questions you have with your health care provider.   Document Released: 02/07/2002 Document Revised: 01/02/2015 Document Reviewed: 05/09/2011 Elsevier Interactive Patient Education [employer].

## 2016-01-20 NOTE — Progress Notes (Signed)
GYNECOLOGY ANNUAL EXAM PROGRESS NOTE  Subjective:     Rhonda Davies is a 25 y.o. P0 female here for a routine exam.  Current complaints: left sided pelvic pain x 1 week.  Aggravated by changing positions.   Personal health questionnaire reviewed: yes.  The patient is sexually active (1 partner).  She does wear seatbelts. She does participate in regular exercise. Patient denies tattoos or history of blood transfusion.    Gynecologic History Patient's last menstrual period was 01/02/2016. Contraception: coitus interruptus Last Pap: 12/2013. Results were: normal.  Denies h/o abnormal pap smears.  Denies h/o STIs.   Obstetric History OB History  Gravida Para Term Preterm AB SAB TAB Ectopic Multiple Living          History reviewed. No pertinent past medical history.   Past Surgical History  Procedure Laterality Date  . Tonsillectomy and adenoidectomy  2007  . Cyst removed  2013    IN NECK    Family History  Problem Relation Age of Onset  . Heart disease Father   . Diabetes Maternal Aunt   . Heart disease Maternal Grandfather   . Diabetes Maternal Grandfather   . Diabetes Paternal Grandmother   . Heart disease Paternal Grandfather   . Breast cancer Neg Hx   . Colon cancer Neg Hx   . Ovarian cancer Neg Hx     Social History   Social History  . Marital Status: Single    Spouse Name: N/A  . Number of Children: N/A  . Years of Education: N/A   Occupational History  . Not on file.   Social History Main Topics  . Smoking status: Never Smoker   . Smokeless tobacco: Not on file  . Alcohol Use: 0.6 oz/week    1 Standard drinks or equivalent per week  . Drug Use: No  . Sexual Activity:    Partners: Male    Birth Control/ Protection: None   Other Topics Concern  . Not on file   Social History Narrative   No current outpatient prescriptions on file prior to visit.   No current facility-administered medications on file prior to visit.     Allergies  Allergen Reactions  . Benzonatate Swelling    Review of Systems Constitutional: negative for chills, fatigue, fevers and sweats Eyes: negative for irritation, redness and visual disturbance Ears, nose, mouth, throat, and face: negative for hearing loss, nasal congestion, snoring and tinnitus Respiratory: negative for asthma, cough, sputum Cardiovascular: negative for chest pain, dyspnea, exertional chest pressure/discomfort, irregular heart beat, palpitations and syncope Gastrointestinal: negative for abdominal pain, change in bowel habits, nausea and vomiting Genitourinary: negative for abnormal menstrual periods, genital lesions, sexual problems and vaginal discharge, dysuria and urinary incontinence Integument/breast: negative for breast lump, breast tenderness and nipple discharge Hematologic/lymphatic: negative for bleeding and easy bruising Musculoskeletal:negative for back pain and muscle weakness Neurological: negative for dizziness, headaches, vertigo and weakness Endocrine: negative for diabetic symptoms including polydipsia, polyuria and skin dryness Allergic/Immunologic: negative for hay fever and urticaria    Objective:    BP 116/80 mmHg  Pulse 86  Ht  (1.626 m)  Wt 218 lb 4.8 oz (99.02 kg)  BMI 37.45 kg/m2  LMP 01/02/2016   BP 116/80 mmHg  Pulse 86  Ht  (1.626 m)  Wt 218 lb 4.8 oz (99.02 kg)  BMI 37.45 kg/m2  LMP 01/02/2016    General Appearance:  Alert, cooperative, no distress, appears stated age, moderately obese  Head:    Normocephalic, without obvious abnormality, atraumatic  Eyes:    PERRL, conjunctiva/corneas clear, EOM's intact, both eyes  Ears:    Normal external ear canals, both ears  Nose:   Nares normal, septum midline, mucosa normal, no drainage or sinus tenderness  Throat:   Lips, mucosa, and tongue normal; teeth and gums normal  Neck:   Supple, symmetrical, trachea midline, no adenopathy; thyroid: no  enlargement/tenderness/nodules; no carotid bruit or JVD  Back:     Symmetric, no curvature, ROM normal, no CVA tenderness  Lungs:     Clear to auscultation bilaterally, respirations unlabored  Chest Wall:    No tenderness or deformity   Heart:    Regular rate and rhythm, S1 and S2 normal, no murmur, rub or gallop  Breast Exam:    No tenderness, masses, or nipple abnormality  Abdomen:     Soft, bowel sounds active all four quadrants, no masses, no organomegaly.  Mild tenderness in LLQ.   Genitalia:    Pelvic:external genitalia normal, vagina with lesions, discharge, or tenderness, rectovaginal septum  normal. Cervix normal in appearance, no cervical motion tenderness, no adnexal masses, left adnexal tenderness.  Uterus normal size, shape, mobile, nontender.   Rectal:    Normal external sphincter.  No hemorrhoids appreciated. Internal exam not done.   Extremities:   Extremities normal, atraumatic, no cyanosis or edema  Pulses:   2+ and symmetric all extremities  Skin:   Skin color, texture, turgor normal, no rashes or lesions  Lymph nodes:   Cervical, supraclavicular, and axillary nodes normal  Neurologic:   CNII-XII intact, normal strength, sensation and reflexes throughout      Assessment:    Healthy female exam.   Pelvic pain (left sided)  Plan:   Education reviewed: safe sex/STD prevention, weight bearing exercise and healthy lifestyle modifications and diet. Pap smear: up to date.  Contraception: coitus interruptus.  Discussed failure rate of current method when compared to other methods.  Patient notes understanding however still declines other method of contraception at this time.  Declines flu vaccine.  Desires STI testing  Follow up in: 1 week for ultrasound for pelvic pain.  Will notify patient by phone of results. Advised on OTC pain meds.  To f/u in 1 year for next annual exam.        Hildred Laser, MD [facility]

## 2016-01-21 ENCOUNTER — Encounter: Payer: Self-pay | Admitting: Obstetrics and Gynecology

## 2016-01-21 LAB — CBC
Hematocrit: 40.3 % (ref 34.0–46.6)
Hemoglobin: 13.5 g/dL (ref 11.1–15.9)
MCH: 29.1 pg (ref 26.6–33.0)
MCHC: 33.5 g/dL (ref 31.5–35.7)
MCV: 87 fL (ref 79–97)
Platelets: 272 10*3/uL (ref 150–379)
RBC: 4.64 x10E6/uL (ref 3.77–5.28)
RDW: 13 % (ref 12.3–15.4)
WBC: 5.6 10*3/uL (ref 3.4–10.8)

## 2016-01-21 LAB — COMPREHENSIVE METABOLIC PANEL
ALK PHOS: 81 IU/L (ref 39–117)
ALT: 14 IU/L (ref 0–32)
AST: 14 IU/L (ref 0–40)
Albumin/Globulin Ratio: 2.1 (ref 1.1–2.5)
Albumin: 4.4 g/dL (ref 3.5–5.5)
BUN/Creatinine Ratio: 21 — ABNORMAL HIGH (ref 8–20)
BUN: 14 mg/dL (ref 6–20)
Bilirubin Total: 1.1 mg/dL (ref 0.0–1.2)
CHLORIDE: 99 mmol/L (ref 96–106)
CO2: 23 mmol/L (ref 18–29)
CREATININE: 0.67 mg/dL (ref 0.57–1.00)
Calcium: 9.2 mg/dL (ref 8.7–10.2)
GFR calc Af Amer: 144 mL/min/{1.73_m2} (ref >59)
GFR calc non Af Amer: 125 mL/min/{1.73_m2} (ref >59)
GLOBULIN, TOTAL: 2.1 g/dL (ref 1.5–4.5)
GLUCOSE: 83 mg/dL (ref 65–99)
Potassium: 4.4 mmol/L (ref 3.5–5.2)
SODIUM: 138 mmol/L (ref 134–144)
Total Protein: 6.5 g/dL (ref 6.0–8.5)

## 2016-01-21 LAB — RPR: RPR: NONREACTIVE

## 2016-01-21 LAB — HIV ANTIBODY (ROUTINE TESTING W REFLEX): HIV SCREEN 4TH GENERATION: NONREACTIVE

## 2016-01-21 LAB — HEPATITIS B SURFACE ANTIGEN: Hepatitis B Surface Ag: NEGATIVE

## 2016-01-22 ENCOUNTER — Ambulatory Visit (INDEPENDENT_AMBULATORY_CARE_PROVIDER_SITE_OTHER): Payer: 59

## 2016-01-22 DIAGNOSIS — R102 Pelvic and perineal pain: Secondary | ICD-10-CM | POA: Diagnosis not present

## 2016-01-26 ENCOUNTER — Telehealth: Payer: Self-pay | Admitting: Obstetrics and Gynecology

## 2016-01-26 NOTE — Telephone Encounter (Signed)
This pt had an [place] Friday and hasnt heard the results. She needs to know if she needs an appt.

## 2016-01-27 ENCOUNTER — Other Ambulatory Visit: Payer: Self-pay | Admitting: Obstetrics and Gynecology

## 2016-01-27 DIAGNOSIS — Z8619 Personal history of other infectious and parasitic diseases: Secondary | ICD-10-CM

## 2016-01-27 DIAGNOSIS — A749 Chlamydial infection, unspecified: Secondary | ICD-10-CM

## 2016-01-27 DIAGNOSIS — R102 Pelvic and perineal pain: Secondary | ICD-10-CM | POA: Insufficient documentation

## 2016-01-27 HISTORY — DX: Personal history of other infectious and parasitic diseases: Z86.19

## 2016-01-27 LAB — GC/CHLAMYDIA PROBE AMP
Chlamydia trachomatis, NAA: POSITIVE — AB
NEISSERIA GONORRHOEAE BY PCR: NEGATIVE

## 2016-01-27 MED ORDER — AZITHROMYCIN 500 MG PO TABS: 1000.0000 mg | ORAL_TABLET | Freq: Once | ORAL | Status: DC

## 2016-01-27 NOTE — Telephone Encounter (Signed)
LM informing pt that u/s was negative.

## 2016-01-28 ENCOUNTER — Telehealth: Payer: Self-pay

## 2016-01-28 NOTE — Telephone Encounter (Signed)
Called pt informed her of positive Chlamydia and the need for treatment. Pt gave verbal understanding.

## 2016-01-28 NOTE — Telephone Encounter (Signed)
-----   Message from Hildred Laser, MD sent at 01/27/2016  5:18 PM EST ----- Please inform patient of positive Chlamydia test.  Will need treatment with Azithromycin 1 gram.  Attempted to call today (01/27/2016 at 5:15 PM), and left message for patient to call back for results.  Will also send a message through MyChart.  Patient will also need to encourage partner testing and treatment and avoiding intercourse x 7 days after both parties treated.  Will send in prescription to pharmacy.

## 2016-02-25 ENCOUNTER — Ambulatory Visit: Payer: 59 | Admitting: Obstetrics and Gynecology

## 2016-03-02 ENCOUNTER — Ambulatory Visit (INDEPENDENT_AMBULATORY_CARE_PROVIDER_SITE_OTHER): Payer: 59 | Admitting: Obstetrics and Gynecology

## 2016-03-02 ENCOUNTER — Encounter: Payer: Self-pay | Admitting: Obstetrics and Gynecology

## 2016-03-02 VITALS — BP 110/67 | HR 69 | Ht 64.0 in | Wt 211.8 lb

## 2016-03-02 DIAGNOSIS — R102 Pelvic and perineal pain: Secondary | ICD-10-CM | POA: Diagnosis not present

## 2016-03-02 DIAGNOSIS — A749 Chlamydial infection, unspecified: Secondary | ICD-10-CM

## 2016-03-02 MED ORDER — TRAMADOL HCL 50 MG PO TABS: 50.0000 mg | ORAL_TABLET | Freq: Four times a day (QID) | ORAL | Status: DC | PRN

## 2016-03-02 NOTE — Progress Notes (Signed)
    GYNECOLOGY PROGRESS NOTE  Subjective:    Patient ID: Rhonda Davies, female    DOB: 11/06/1991, 25 y.o.   MRN: [id number]  HPI  Patient is a 25 y.o. G0P0 [facility] Chlamydia infection (diagnosed 01/20/2016), and f/u of left sided pelvic pain. Patient notes compliance with medications.  Partner present for today's visit, also reports being tested and treated ~ 1 week after patient's diagnosis.  Note that they have not resumed intercourse yet.  Had ultrasound performed after last visit (01/22/2016), also presents for discussion of results.   Patient still notes pelvic pain on left side, intermittent, lasting for up to 30 min, then resolving.  Is sometimes controlled with OTC Ibuprofen/Tylenol, but other times is not. Denies constipation, diarrhea, nausea/vomiting. Denies cyclical pain.    The following portions of the patient's history were reviewed and updated as appropriate: allergies, current medications, past family history, past medical history, past social history, past surgical history and problem list.   Review of Systems Pertinent items noted in HPI and remainder of comprehensive ROS otherwise negative.   Objective:   Blood pressure 110/67, pulse 69, height 5\' 4"  (1.626 m), weight 211 lb 12.8 oz (96.072 kg). General appearance: alert and no distress Abdomen: soft, non-tender; bowel sounds normal; no masses,  no organomegaly Pelvic: cervix normal in appearance, external genitalia normal, no adnexal masses or tenderness, no cervical motion tenderness, rectovaginal septum normal, uterus normal size, shape, and consistency and vagina normal without discharge Extremities: extremities normal, atraumatic, no cyanosis or edema Neurologic: Grossly normal    Labs:  Results for Rhonda Davies, Rhonda Davies (MRN [id number]) as of 03/02/2016 16:45  Ref. Range 01/20/2016 12:07  Chlamydia Latest Ref Range: Negative  Positive (A)  GC Probe Amp, Genital Latest Ref Range: Negative   Negative   Imaging (Pelvic Ultrasound 01/22/2016):  Indications: Left sided pelvic pain Findings:  The uterus measures 7.7 x 3.6 x 4.1 cm Echo texture is homogenous without evidence of focal masses.  The Endometrium measures 8.4 mm.  Right Ovary measures 2.9 x 1.6 x 1.5 cm. It appears within normal limits. Left Ovary measures 3.4 x 2.2 x 2.4 cm. It appears within normal limits. Survey of the adnexa demonstrates no adnexal masses. There is no free fluid in the cul de sac.  Impression: 1. Normal appearing pelvic ultrasound.   Assessment:   H/o chlamydia infection, treated Pelvic pain (mostly left sided)  Plan:  TOC done today for chlamydia.  Reiterated safe sex practices.  Reviewed ultrasound with patient.  Essentially normal ultrasound with no evidence of cysts, pelvic masses, uterine enlargement.  Unsure cause of pelvic pain at this time.  Prescribed Tramadol for stronger episodes of pain, can take Tylenol/Ibuprofen for mild pain.  If pain continues beyond next several weeks,  discussed as last resort the possibility of diagnostic laparoscopy.  Would also recommend f/u PCP as could be other causes of pain outside of GYN scope.  To follow up if symptoms worsen or fail to resolve.    A total of 15 minutes were spent face-to-face with the patient during this encounter and over half of that time dealt with counseling and coordination of care.   Hildred LaserAnika Markos Theil, MD [facility]

## 2016-03-08 LAB — NUSWAB VAGINITIS PLUS (VG+)
Atopobium vaginae: HIGH {score} — AB
BVAB 2: HIGH {score} — AB
Candida albicans, NAA: NEGATIVE
Candida glabrata, NAA: NEGATIVE
Chlamydia trachomatis, NAA: NEGATIVE
Megasphaera 1: HIGH {score} — AB
Neisseria gonorrhoeae, NAA: NEGATIVE
Trich vag by NAA: NEGATIVE

## 2016-03-15 ENCOUNTER — Encounter: Payer: Self-pay | Admitting: Emergency Medicine

## 2016-03-15 ENCOUNTER — Emergency Department
Admission: EM | Admit: 2016-03-15 | Discharge: 2016-03-15 | Disposition: A | Discharge status: Home / Self Care | Payer: 59 | Attending: Emergency Medicine | Admitting: Emergency Medicine

## 2016-03-15 DIAGNOSIS — R55 Syncope and collapse: Secondary | ICD-10-CM | POA: Insufficient documentation

## 2016-03-15 DIAGNOSIS — O2311 Infections of bladder in pregnancy, first trimester: Secondary | ICD-10-CM | POA: Diagnosis not present

## 2016-03-15 DIAGNOSIS — E860 Dehydration: Secondary | ICD-10-CM | POA: Diagnosis not present

## 2016-03-15 DIAGNOSIS — Z3A01 Less than 8 weeks gestation of pregnancy: Secondary | ICD-10-CM | POA: Diagnosis not present

## 2016-03-15 DIAGNOSIS — O219 Vomiting of pregnancy, unspecified: Secondary | ICD-10-CM

## 2016-03-15 DIAGNOSIS — R112 Nausea with vomiting, unspecified: Secondary | ICD-10-CM | POA: Diagnosis present

## 2016-03-15 LAB — CBC
HCT: 40.9 % (ref 35.0–47.0)
Hemoglobin: 14 g/dL (ref 12.0–16.0)
MCH: 29.5 pg (ref 26.0–34.0)
MCHC: 34.1 g/dL (ref 32.0–36.0)
MCV: 86.4 fL (ref 80.0–100.0)
PLATELETS: 246 10*3/uL (ref 150–440)
RBC: 4.73 MIL/uL (ref 3.80–5.20)
RDW: 13 % (ref 11.5–14.5)
WBC: 7.2 10*3/uL (ref 3.6–11.0)

## 2016-03-15 LAB — URINALYSIS COMPLETE WITH MICROSCOPIC (ARMC ONLY)
Bacteria, UA: NONE SEEN
Bilirubin Urine: NEGATIVE
GLUCOSE, UA: NEGATIVE mg/dL
Hgb urine dipstick: NEGATIVE
KETONES UR: NEGATIVE mg/dL
Nitrite: NEGATIVE
PROTEIN: 30 mg/dL — AB
SPECIFIC GRAVITY, URINE: 1.029 (ref 1.005–1.030)
pH: 6 (ref 5.0–8.0)

## 2016-03-15 LAB — COMPREHENSIVE METABOLIC PANEL
ALK PHOS: 64 U/L (ref 38–126)
ALT: 19 U/L (ref 14–54)
AST: 24 U/L (ref 15–41)
Albumin: 4.6 g/dL (ref 3.5–5.0)
Anion gap: 9 (ref 5–15)
BILIRUBIN TOTAL: 1.1 mg/dL (ref 0.3–1.2)
BUN: 10 mg/dL (ref 6–20)
CALCIUM: 9 mg/dL (ref 8.9–10.3)
CO2: 22 mmol/L (ref 22–32)
CREATININE: 0.68 mg/dL (ref 0.44–1.00)
Chloride: 104 mmol/L (ref 101–111)
Glucose, Bld: 109 mg/dL — ABNORMAL HIGH (ref 65–99)
Potassium: 3.2 mmol/L — ABNORMAL LOW (ref 3.5–5.1)
Sodium: 135 mmol/L (ref 135–145)
TOTAL PROTEIN: 7.9 g/dL (ref 6.5–8.1)

## 2016-03-15 LAB — LIPASE, BLOOD: LIPASE: 15 U/L (ref 11–51)

## 2016-03-15 LAB — PREGNANCY, URINE: PREG TEST UR: POSITIVE — AB

## 2016-03-15 LAB — HCG, QUANTITATIVE, PREGNANCY: hCG, Beta Chain, Quant, S: 46050 m[IU]/mL — ABNORMAL HIGH (ref <5)

## 2016-03-15 MED ORDER — METOCLOPRAMIDE HCL 10 MG PO TABS: 10.0000 mg | ORAL_TABLET | Freq: Four times a day (QID) | ORAL | Status: DC | PRN

## 2016-03-15 MED ORDER — SODIUM CHLORIDE 0.9 % IV BOLUS (SEPSIS)
1000.0000 mL | Freq: Once | INTRAVENOUS | Status: AC
  Administered 2016-03-15: 1000 mL via INTRAVENOUS

## 2016-03-15 MED ORDER — METOCLOPRAMIDE HCL 5 MG/ML IJ SOLN
10.0000 mg | Freq: Once | INTRAMUSCULAR | Status: AC
  Administered 2016-03-15: 10 mg via INTRAVENOUS
  Filled 2016-03-15: qty 2

## 2016-03-15 MED ORDER — DIPHENHYDRAMINE HCL 50 MG/ML IJ SOLN
25.0000 mg | Freq: Once | INTRAMUSCULAR | Status: AC
  Administered 2016-03-15: 25 mg via INTRAVENOUS
  Filled 2016-03-15: qty 1

## 2016-03-15 MED ORDER — ONDANSETRON HCL 4 MG/2ML IJ SOLN
4.0000 mg | Freq: Once | INTRAMUSCULAR | Status: AC | PRN
  Administered 2016-03-15: 4 mg via INTRAVENOUS
  Filled 2016-03-15: qty 2

## 2016-03-15 MED ORDER — DEXTROSE 5 % AND 0.9 % NACL IV BOLUS
1000.0000 mL | Freq: Once | INTRAVENOUS | Status: AC
  Administered 2016-03-15: 1000 mL via INTRAVENOUS

## 2016-03-15 MED ORDER — NITROFURANTOIN MACROCRYSTAL 100 MG PO CAPS: 100.0000 mg | ORAL_CAPSULE | Freq: Two times a day (BID) | ORAL | Status: DC

## 2016-03-15 MED ORDER — ACETAMINOPHEN 500 MG PO TABS
1000.0000 mg | ORAL_TABLET | Freq: Once | ORAL | Status: AC
Start: 2016-03-15 — End: 2016-03-15
  Administered 2016-03-15: 1000 mg via ORAL
  Filled 2016-03-15: qty 2

## 2016-03-15 MED ORDER — PRENATAL VITAMINS 0.8 MG PO TABS: 1.0000 | ORAL_TABLET | Freq: Every day | ORAL | Status: DC

## 2016-03-15 NOTE — Discharge Instructions (Signed)
Dehydration, Adult Dehydration is a condition in which you do not have enough fluid or water in your body. It happens when you take in less fluid than you lose. Vital organs such as the kidneys, brain, and heart cannot function without a proper amount of fluids. Any loss of fluids from the body can cause dehydration.  Dehydration can range from mild to severe. This condition should be treated right away to help prevent it from becoming severe. CAUSES  This condition may be caused by:  Vomiting.  Diarrhea.  Excessive sweating, such as when exercising in hot or humid weather.  Not drinking enough fluid during strenuous exercise or during an illness.  Excessive urine output.  Fever.  Certain medicines. RISK FACTORS This condition is more likely to develop in:  People who are taking certain medicines that cause the body to lose excess fluid (diuretics).   People who have a chronic illness, such as diabetes, that may increase urination.  Older adults.   People who live at high altitudes.   People who participate in endurance sports.  SYMPTOMS  Mild Dehydration  Thirst.  Dry lips.  Slightly dry mouth.  Dry, warm skin. Moderate Dehydration  Very dry mouth.   Muscle cramps.   Dark urine and decreased urine production.   Decreased tear production.   Headache.   Light-headedness, especially when you stand up from a sitting position.  Severe Dehydration  Changes in skin.   Cold and clammy skin.   Skin does not spring back quickly when lightly pinched and released.   Changes in body fluids.   Extreme thirst.   No tears.   Not able to sweat when body temperature is high, such as in hot weather.   Minimal urine production.   Changes in vital signs.   Rapid, weak pulse (more than 100 beats per minute when you are sitting still).   Rapid breathing.   Low blood pressure.   Other changes.   Sunken eyes.   Cold hands and feet.    Confusion.  Lethargy and difficulty being awakened.  Fainting (syncope).   Short-term weight loss.   Unconsciousness. DIAGNOSIS  This condition may be diagnosed based on your symptoms. You may also have tests to determine how severe your dehydration is. These tests may include:   Urine tests.   Blood tests.  TREATMENT  Treatment for this condition depends on the severity. Mild or moderate dehydration can often be treated at home. Treatment should be started right away. Do not wait until dehydration becomes severe. Severe dehydration needs to be treated at the hospital. Treatment for Mild Dehydration  Drinking plenty of water to replace the fluid you have lost.   Replacing minerals in your blood (electrolytes) that you may have lost.  Treatment for Moderate Dehydration  Consuming oral rehydration solution (ORS). Treatment for Severe Dehydration  Receiving fluid through an IV tube.   Receiving electrolyte solution through a feeding tube that is passed through your nose and into your stomach (nasogastric tube or NG tube).  Correcting any abnormalities in electrolytes. HOME CARE INSTRUCTIONS   Drink enough fluid to keep your urine clear or pale yellow.   Drink water or fluid slowly by taking small sips. You can also try sucking on ice cubes.  Have food or beverages that contain electrolytes. Examples include bananas and sports drinks.  Take over-the-counter and prescription medicines only as told by your health care provider.   Prepare ORS according to the manufacturer's instructions. Take sips  of ORS every 5 minutes until your urine returns to normal.  If you have vomiting or diarrhea, continue to try to drink water, ORS, or both.   If you have diarrhea, avoid:   Beverages that contain caffeine.   Fruit juice.   Milk.   Carbonated soft drinks.  Do not take salt tablets. This can lead to the condition of having too much sodium in your body  (hypernatremia).  SEEK MEDICAL CARE IF:  You cannot eat or drink without vomiting.  You have had moderate diarrhea during a period of more than 24 hours.  You have a fever. SEEK IMMEDIATE MEDICAL CARE IF:   You have extreme thirst.  You have severe diarrhea.  You have not urinated in 6-8 hours, or you have urinated only a small amount of very dark urine.  You have shriveled skin.  You are dizzy, confused, or both.   This information is not intended to replace advice given to you by your health care provider. Make sure you discuss any questions you have with your health care provider.   Document Released: 12/12/2005 Document Revised: 09/02/2015 Document Reviewed: 04/29/2015 Elsevier Interactive Patient Education 2016 Else[employer] Inc[employer]  Morning Sickness Morning sickness is when you feel sick to your stomach (nauseous) during pregnancy. This nauseous feeling may or may not come with vomiting. It often occurs in the morning but can be a problem any time of day. Morning sickness is most common during the first trimester, but it may continue throughout pregnancy. While morning sickness is unpleasant, it is usually harmless unless you develop severe and continual vomiting (hyperemesis gravidarum). This condition requires more intense treatment.  CAUSES  The cause of morning sickness is not completely known but seems to be related to normal hormonal changes that occur in pregnancy. RISK FACTORS You are at greater risk if you:  Experienced nausea or vomiting before your pregnancy.  Had morning sickness during a previous pregnancy.  Are pregnant with more than one baby, such as twins. TREATMENT  Do not use any medicines (prescription, over-the-counter, or herbal) for morning sickness without first talking to your health care provider. Your health care provider may prescribe or recommend:  Vitamin B6 supplements.  Anti-nausea medicines.  The herbal medicine ginger. HOME CARE  INSTRUCTIONS   Only take over-the-counter or prescription medicines as directed by your health care provider.  Taking multivitamins before getting pregnant can prevent or decrease the severity of morning sickness in most women.  Eat a piece of dry toast or unsalted crackers before getting out of bed in the morning.  Eat five or six small meals a day.  Eat dry and bland foods (rice, baked potato). Foods high in carbohydrates are often helpful.  Do not drink liquids with your meals. Drink liquids between meals.  Avoid greasy, fatty, and spicy foods.  Get someone to cook for you if the smell of any food causes nausea and vomiting.  If you feel nauseous after taking prenatal vitamins, take the vitamins at night or with a snack.  Snack on protein foods (nuts, yogurt, cheese) between meals if you are hungry.  Eat unsweetened gelatins for desserts.  Wearing an acupressure wristband (worn for sea sickness) may be helpful.  Acupuncture may be helpful.  Do not smoke.  Get a humidifier to keep the air in your house free of odors.  Get plenty of fresh air. SEEK MEDICAL CARE IF:   Your home remedies are not working, and you need medicine.  You feel  dizzy or lightheaded.  You are losing weight. SEEK IMMEDIATE MEDICAL CARE IF:   You have persistent and uncontrolled nausea and vomiting.  You pass out (faint). MAKE SURE YOU:  Understand these instructions.  Will watch your condition.  Will get help right away if you are not doing well or get worse.   This information is not intended to replace advice given to you by your health care provider. Make sure you discuss any questions you have with your health care provider.   Document Released: 02/02/2007 Document Revised: 12/17/2013 Document Reviewed: 05/29/2013 Elsevier Interactive Patient Education 2016 Elsevier Inc.  Pregnancy and Urinary Tract Infection A urinary tract infection (UTI) is a bacterial infection of the urinary  tract. Infection of the urinary tract can include the ureters, kidneys (pyelonephritis), bladder (cystitis), and urethra (urethritis). All pregnant women should be screened for bacteria in the urinary tract. Identifying and treating a UTI will decrease the risk of preterm labor and developing more serious infections in both the mother and baby. CAUSES Bacteria germs cause almost all UTIs.  RISK FACTORS Many factors can increase your chances of getting a UTI during pregnancy. These include:  Having a short urethra.  Poor toilet and hygiene habits.  Sexual intercourse.  Blockage of urine along the urinary tract.  Problems with the pelvic muscles or nerves.  Diabetes.  Obesity.  Bladder problems after having several children.  Previous history of UTI. SIGNS AND SYMPTOMS   Pain, burning, or a stinging feeling when urinating.  Suddenly feeling the need to urinate right away (urgency).  Loss of bladder control (urinary incontinence).  Frequent urination, more than is common with pregnancy.  Lower abdominal or back discomfort.  Cloudy urine.  Blood in the urine (hematuria).  Fever. When the kidneys are infected, the symptoms may be:  Back pain.  Flank pain on the right side more so than the left.  Fever.  Chills.  Nausea.  Vomiting. DIAGNOSIS  A urinary tract infection is usually diagnosed through urine tests. Additional tests and procedures are sometimes done. These may include:  Ultrasound exam of the kidneys, ureters, bladder, and urethra.  Looking in the bladder with a lighted tube (cystoscopy). TREATMENT Typically, UTIs can be treated with antibiotic medicines.  HOME CARE INSTRUCTIONS   Only take over-the-counter or prescription medicines as directed by your health care provider. If you were prescribed antibiotics, take them as directed. Finish them even if you start to feel better.  Drink enough fluids to keep your urine clear or pale yellow.  Do not  have sexual intercourse until the infection is gone and your health care provider says it is okay.  Make sure you are tested for UTIs throughout your pregnancy. These infections often come back. Preventing a UTI in the Future  Practice good toilet habits. Always wipe from front to back. Use the tissue only once.  Do not hold your urine. Empty your bladder as soon as possible when the urge comes.  Do not douche or use deodorant sprays.  Wash with soap and warm water around the genital area and the anus.  Empty your bladder before and after sexual intercourse.  Wear underwear with a cotton crotch.  Avoid caffeine and carbonated drinks. They can irritate the bladder.  Drink cranberry juice or take cranberry pills. This may decrease the risk of getting a UTI.  Do not drink alcohol.  Keep all your appointments and tests as scheduled. SEEK MEDICAL CARE IF:   Your symptoms get worse.  You are still having fevers 2 or more days after treatment begins.  You have a rash.  You feel that you are having problems with medicines prescribed.  You have abnormal vaginal discharge. SEEK IMMEDIATE MEDICAL CARE IF:   You have back or flank pain.  You have chills.  You have blood in your urine.  You have nausea and vomiting.  You have contractions of your uterus.  You have a gush of fluid from the vagina. MAKE SURE YOU:  Understand these instructions.   Will watch your condition.   Will get help right away if you are not doing well or get worse.    This information is not intended to replace advice given to you by your health care provider. Make sure you discuss any questions you have with your health care provider.   Document Released: 04/08/2011 Document Revised: 10/02/2013 Document Reviewed: 07/11/2013 Elsevier Interactive Patient Education [employer]

## 2016-03-15 NOTE — ED Notes (Signed)
Pt now states she is [redacted] weeks pregnant and her last period was 01/30/2016.  Pt also states she was throwing up dark blood yesterday afternoon x 3-4 times that looked like coffee grounds.

## 2016-03-15 NOTE — ED Provider Notes (Signed)
[facility] Emergency Department Provider Note  ____________________________________________  Time seen: 1:05 PM  I have reviewed the triage vital signs and the nursing notes.   HISTORY  Chief Complaint Nausea; Emesis; and Loss of Consciousness    HPI Rhonda Davies is a 25 y.o. female who complains of syncope today. She's been having nausea and vomiting for the past 4 days and decreased oral intake. She's been getting dizzy when she stands up and walk around and today at work she fell down. She does not think she hit her head but did land on her backside. She has pain in the buttocks area. Recently her vomiting is also looked like there were coffee grounds in it. No change in bowel movements such as bloody stool or melena. No medical history, no surgeries. Just found out she [redacted] weeks pregnant.     History reviewed. No pertinent past medical history.   Patient Active Problem List   Diagnosis Date Noted  . Chlamydia infection 01/27/2016  . Pelvic pain in female 01/27/2016  . Increased BMI 06/05/2015  . Dysfunctional uterine bleeding 06/05/2015  . Encounter for surveillance of injectable contraceptive 06/05/2015     Past Surgical History  Procedure Laterality Date  . Tonsillectomy and adenoidectomy  2007  . Cyst removed  2013    IN NECK     Current Outpatient Rx  Name  Route  Sig  Dispense  Refill  . lidocaine (XYLOCAINE) 2 % jelly   Topical   Apply 1 application topically as needed.   30 mL   1   . metoCLOPramide (REGLAN) 10 MG tablet   Oral   Take 1 tablet (10 mg total) by mouth every 6 (six) hours as needed for nausea.   60 tablet   0   . Prenatal Multivit-Min-Fe-FA (PRENATAL VITAMINS) 0.8 MG tablet   Oral   Take 1 tablet by mouth daily.   30 tablet   3   . traMADol (ULTRAM) 50 MG tablet   Oral   Take 1 tablet (50 mg total) by mouth every 6 (six) hours as needed.   60 tablet   0      Allergies Benzonatate   Family  History  Problem Relation Age of Onset  . Heart disease Father   . Diabetes Maternal Aunt   . Heart disease Maternal Grandfather   . Diabetes Maternal Grandfather   . Diabetes Paternal Grandmother   . Heart disease Paternal Grandfather   . Breast cancer Neg Hx   . Colon cancer Neg Hx   . Ovarian cancer Neg Hx     Social History Social History  Substance Use Topics  . Smoking status: Never Smoker   . Smokeless tobacco: None  . Alcohol Use: 0.6 oz/week    1 Standard drinks or equivalent per week    Review of Systems  Constitutional:   No fever or chills. No weight changes Eyes:   No blurry vision or double vision.  ENT:   No sore throat.  Cardiovascular:   No chest pain. Respiratory:   No dyspnea or cough. Gastrointestinal:   Generalized abdominal pain with vomiting. No diarrhea. No constipation..  No BRBPR or melena. Genitourinary:   Negative for dysuria or difficulty urinating. Musculoskeletal:   Negative for back pain. No joint swelling or pain. Skin:   Negative for rash. Neurological:   Negative for headaches, focal weakness or numbness. Psychiatric:  No anxiety or depression.   Endocrine:  No changes in energy  or sleep difficulty.  10-point ROS otherwise negative.  ____________________________________________   PHYSICAL EXAM:  VITAL SIGNS: ED Triage Vitals  Enc Vitals Group     BP 03/15/16 1226 118/75 mmHg     Pulse Rate 03/15/16 1226 102     Resp 03/15/16 1226 20     Temp 03/15/16 1226 97.8 F (36.6 C)     Temp Source 03/15/16 1226 Oral     SpO2 03/15/16 1226 100 %     Weight 03/15/16 1226 205 lb (92.987 kg)     Height 03/15/16 1226  (1.651 m)     Head Cir --      Peak Flow --      Pain Score 03/15/16 1227 7     Pain Loc --      Pain Edu? --      Excl. in GC? --     Vital signs reviewed, nursing assessments reviewed.   Constitutional:   Alert and oriented. Well appearing and in no distress. Eyes:   No scleral icterus. No conjunctival  pallor. PERRL. EOMI ENT   Head:   Normocephalic and atraumatic.   Nose:   No congestion/rhinnorhea. No septal hematoma   Mouth/Throat:   Dry mucous membranes, no pharyngeal erythema. No peritonsillar mass.    Neck:   No stridor. No SubQ emphysema. No meningismus. Hematological/Lymphatic/Immunilogical:   No cervical lymphadenopathy. Cardiovascular:   RRR. Symmetric bilateral radial and DP pulses.  No murmurs.  Respiratory:   Normal respiratory effort without tachypnea nor retractions. Breath sounds are clear and equal bilaterally. No wheezes/rales/rhonchi. Gastrointestinal:   Soft and nontender. Non distended. There is no CVA tenderness.  No rebound, rigidity, or guarding. Genitourinary:   deferred Musculoskeletal:   Nontender with normal range of motion in all extremities. No joint effusions.  No lower extremity tenderness.  No edema. Neurologic:   Normal speech and language.  CN 2-10 normal. Motor grossly intact. No gross focal neurologic deficits are appreciated.  Skin:    Skin is warm, dry and intact. No rash noted.  No petechiae, purpura, or bullae. Psychiatric:   Mood and affect are normal. ____________________________________________    LABS (pertinent positives/negatives) (all labs ordered are listed, but only abnormal results are displayed) Labs Reviewed  COMPREHENSIVE METABOLIC PANEL - Abnormal; Notable for the following:    Potassium 3.2 (*)    Glucose, Bld 109 (*)    All other components within normal limits  URINALYSIS COMPLETEWITH MICROSCOPIC ([facility] ONLY) - Abnormal; Notable for the following:    Color, Urine YELLOW (*)    APPearance HAZY (*)    Protein, ur 30 (*)    Leukocytes, UA 1+ (*)    Squamous Epithelial / LPF 6-30 (*)    All other components within normal limits  PREGNANCY, URINE - Abnormal; Notable for the following:    Preg Test, Ur POSITIVE (*)    All other components within normal limits  HCG, QUANTITATIVE, PREGNANCY - Abnormal; Notable for  the following:    hCG, Beta Chain, Quant, S 16109 (*)    All other components within normal limits  URINE CULTURE  LIPASE, BLOOD  CBC   ____________________________________________   EKG  Interpreted by me  Date: 03/15/2016  Rate: 85  Rhythm: normal sinus rhythm  QRS Axis: normal  Intervals: normal  ST/T Wave abnormalities: normal  Conduction Disutrbances: none  Narrative Interpretation: unremarkable      ____________________________________________    RADIOLOGY    ____________________________________________   PROCEDURES   ____________________________________________  INITIAL IMPRESSION / ASSESSMENT AND PLAN / ED COURSE  Pertinent labs & imaging results that were available during my care of the patient were reviewed by me and considered in my medical decision making (see chart for details).  Patient presents with nausea vomiting in first trimester pregnancy. Overall well-appearing, exam reassuring, low suspicion for appendicitis torsion TOA STI PID or cholecystitis. No evidence of perforation or obstruction. Labs are unremarkable. Patient vigorously hydrated with IV fluids, Reglan for nausea, prenatal vitamins, follow-up with health Department.     ____________________________________________   FINAL CLINICAL IMPRESSION(S) / ED DIAGNOSES  Final diagnoses:  Cystitis during pregnancy, antepartum, first trimester  Dehydration  Nausea and vomiting during pregnancy prior to [redacted] weeks gestation      Sharman Cheek, MD 03/15/16 1516

## 2016-03-15 NOTE — ED Notes (Addendum)
Pt with nausea and vomiting since Saturday.  Pt unable to eat or drink much since then.  Pt was at work today, when she reports feeling dizzy and then next thing she remembered, she was on the ground with co-workers around.  Pt unsure if she hit her hit, but complains of tailbone pain.  Denies head soreness.  Pt is currently alert and oriented x4

## 2016-03-17 ENCOUNTER — Encounter: Payer: Self-pay | Admitting: *Deleted

## 2016-03-17 ENCOUNTER — Ambulatory Visit (INDEPENDENT_AMBULATORY_CARE_PROVIDER_SITE_OTHER): Payer: 59 | Admitting: Primary Care

## 2016-03-17 ENCOUNTER — Encounter: Payer: Self-pay | Admitting: Primary Care

## 2016-03-17 VITALS — BP 122/82 | HR 92 | Temp 98.6°F | Ht 63.0 in | Wt 207.2 lb

## 2016-03-17 DIAGNOSIS — R109 Unspecified abdominal pain: Secondary | ICD-10-CM | POA: Diagnosis not present

## 2016-03-17 DIAGNOSIS — R102 Pelvic and perineal pain: Secondary | ICD-10-CM

## 2016-03-17 LAB — POC URINALSYSI DIPSTICK (AUTOMATED)
Blood, UA: NEGATIVE
Glucose, UA: NEGATIVE
Nitrite, UA: NEGATIVE
Protein, UA: 15
Spec Grav, UA: >=1.03
Urobilinogen, UA: 1
pH, UA: 6

## 2016-03-17 LAB — URINE CULTURE

## 2016-03-17 NOTE — Patient Instructions (Signed)
I will be in touch with you regarding your urine test results. Continue the Macrobid antibiotics as prescribed.  Try the stretching exercises as discussed and notify me if you would like try physical therapy.  Schedule an appointment with your GYN as discussed.  It was a pleasure to meet you today! Please don't hesitate to call me with any questions. Welcome to [facility]!

## 2016-03-17 NOTE — Progress Notes (Signed)
Subjective:    Patient ID: Rhonda Davies, female    DOB: 21-Dec-1991, 25 y.o.   MRN: [id number]  HPI  [name] is a 25 year old female who presents today to establish care and discuss the problems mentioned below. Will obtain old records.  1) Left groin pain: Present for 1 year and located to the left lower groin. Her pain is intermittent and is present with movement only. She describes her pain as sharp. She underwent transvaginal ultrasound in late January 2017 per her OB/GYN which was negative.  She was evaluated at [facility] ED on 03/15/16 for complaints of syncope, nausea, and vomiting for the prior 4 days. She recently discovered she is [redacted] weeks pregnant. She underwent urinalysis which showed 1+ leuks with normal culture. She was treated with IV fluids, reglan for nausea, and prenatal vitamins, and discharged home with a prescription for Macrobid. She's taken 2 days worth of the macrobid.   Since her discharge from the ED she's continued to experience nausea and sometimes vomiting after eating. Denies dysuria, vaginal itching, vaginal discharge, flank pain, abdominal pain, diarrhea.  She tested positive for chlamydia on 01/20/16 and treated.  Review of Systems  Constitutional: Negative for fever.  Respiratory: Negative for shortness of breath.   Cardiovascular: Negative for chest pain.  Gastrointestinal: Positive for nausea and vomiting. Negative for abdominal pain and diarrhea.  Genitourinary: Positive for frequency. Negative for dysuria, urgency, vaginal discharge and pelvic pain.  Musculoskeletal:       Left groin pain with movement.  Neurological: Negative for syncope.       Past Medical History  Diagnosis Date  . Kidney stone     Social History   Social History  . Marital Status: Single    Spouse Name: N/A  . Number of Children: N/A  . Years of Education: N/A   Occupational History  . Not on file.   Social History Main Topics  . Smoking status: Never Smoker   .  Smokeless tobacco: Never Used  . Alcohol Use: 0.6 oz/week    1 Standard drinks or equivalent per week  . Drug Use: No  . Sexual Activity:    Partners: Male    Birth Control/ Protection: None   Other Topics Concern  . Not on file   Social History Narrative   Single.    Currently pregnant.   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   Enjoys mudding, fishing.     Past Surgical History  Procedure Laterality Date  . Tonsillectomy and adenoidectomy  2007  . Cyst removed  2013    IN NECK    Family History  Problem Relation Age of Onset  . Heart disease Father   . Diabetes Maternal Aunt   . Heart disease Maternal Grandfather   . Diabetes Maternal Grandfather   . Diabetes Paternal Grandmother   . Heart disease Paternal Grandfather   . Breast cancer Neg Hx   . Colon cancer Neg Hx   . Ovarian cancer Neg Hx     Allergies  Allergen Reactions  . Benzonatate Swelling    Current Outpatient Prescriptions on File Prior to Visit  Medication Sig Dispense Refill  . metoCLOPramide (REGLAN) 10 MG tablet Take 1 tablet (10 mg total) by mouth every 6 (six) hours as needed for nausea. 60 tablet 0  . nitrofurantoin (MACRODANTIN) 100 MG capsule Take 1 capsule (100 mg total) by mouth 2 (two) times daily. 14  capsule 0  . traMADol (ULTRAM) 50 MG tablet Take 1 tablet (50 mg total) by mouth every 6 (six) hours as needed. 60 tablet 0  . Prenatal Multivit-Min-Fe-FA (PRENATAL VITAMINS) 0.8 MG tablet Take 1 tablet by mouth daily. (Patient not taking: Reported on 03/17/2016) 30 tablet 3   No current facility-administered medications on file prior to visit.    BP 122/82 mmHg  Pulse 92  Temp(Src) 98.6 F (37 C) (Oral)  Ht 5\' 3"  (1.6 m)  Wt 207 lb 4 oz (94.008 kg)  BMI 36.72 kg/m2  LMP 01/30/2016    Objective:   Physical Exam  Constitutional: She is oriented to person, place, and time. She appears well-nourished.  Neck: Neck supple.  Cardiovascular: Normal rate and  regular rhythm.   Pulmonary/Chest: Effort normal and breath sounds normal.  Abdominal: Soft. Bowel sounds are normal. There is no tenderness.  Musculoskeletal: Normal range of motion.  Tender upon palpation to left groin tendons/MSK region.  Neurological: She is alert and oriented to person, place, and time.  Skin: Skin is warm and dry.  Psychiatric: She has a normal mood and affect.          Assessment & Plan:  Urinary Tract Infection:  Diagnosed in ED due to 1+ leuks. UA also with casts, oxylate crystals, and trace blood. She is [redacted] weeks pregnant. Will repeat UA and Culture today. Continue Macrobid, will await results.

## 2016-03-17 NOTE — Assessment & Plan Note (Signed)
Left groin pain x 1 year, only with movement, intermittent. US per GYN negative. Tenderness to left groin with palpation. Suspect MSK related and provided stretching and strengthening exercises. Offered PT, she declines at this time. No alarm signs, abdominal exam unremarkable.

## 2016-03-17 NOTE — Progress Notes (Signed)
Pre visit review using our clinic review tool, if applicable. No additional management support is needed unless otherwise documented below in the visit note. 

## 2016-03-19 LAB — URINE CULTURE

## 2016-03-28 ENCOUNTER — Ambulatory Visit (INDEPENDENT_AMBULATORY_CARE_PROVIDER_SITE_OTHER): Payer: 59 | Admitting: Obstetrics and Gynecology

## 2016-03-28 VITALS — BP 113/67 | HR 82 | Wt 206.2 lb

## 2016-03-28 DIAGNOSIS — Z36 Encounter for antenatal screening of mother: Secondary | ICD-10-CM

## 2016-03-28 DIAGNOSIS — Z113 Encounter for screening for infections with a predominantly sexual mode of transmission: Secondary | ICD-10-CM

## 2016-03-28 DIAGNOSIS — Z331 Pregnant state, incidental: Secondary | ICD-10-CM

## 2016-03-28 DIAGNOSIS — Z1389 Encounter for screening for other disorder: Secondary | ICD-10-CM

## 2016-03-28 DIAGNOSIS — R638 Other symptoms and signs concerning food and fluid intake: Secondary | ICD-10-CM

## 2016-03-28 DIAGNOSIS — Z3490 Encounter for supervision of normal pregnancy, unspecified, unspecified trimester: Secondary | ICD-10-CM

## 2016-03-28 DIAGNOSIS — Z369 Encounter for antenatal screening, unspecified: Secondary | ICD-10-CM

## 2016-03-28 NOTE — Progress Notes (Signed)
Ottis Stainobyn N Clarey presents for NOB nurse interview visit. G-1.  P-0. Had pregnancy confirmation [facility] 03/15/2016. UPT-positive. Pt was put on macrodantin while at ER for UTI but her PCP told her to stop taking it because she did not have one. Pregnancy education material explained and given. No cats in the home. NOB labs ordered. TSH/HbgA1c due to Increased BMI. HIV labs and Drug screen were explained optional and she could opt out of tests but did not decline. Drug screen ordered. PNV encouraged. NT to discuss with provider. Pt. To follow up with provider in 4 weeks for NOB physical.  All questions answered.    ZIKA EXPOSURE SCREEN:  The patient has not traveled to a [place] endemic area within the past 6 months, nor has she had unprotected sex with a partner who has travelled to a [place] endemic region within the past 6 months. The patient has been advised to notify us if these factors change any time during this current pregnancy, so adequate testing and monitoring can be initiated.

## 2016-03-28 NOTE — Patient Instructions (Signed)
Pregnancy and Zika Virus Disease Zika virus disease, or Zika, is an illness that can spread to people from mosquitoes that carry the virus. It may also spread from person to person through infected body fluids. Zika first occurred in [place], but recently it has spread to new areas. The virus occurs in tropical climates. The location of Zika continues to change. Most people who become infected with Zika virus do not develop serious illness. However, Zika may cause birth defects in an unborn baby whose mother is infected with the virus. It may also increase the risk of miscarriage. WHAT ARE THE SYMPTOMS OF ZIKA VIRUS DISEASE? In many cases, people who have been infected with Zika virus do not develop any symptoms. If symptoms appear, they usually start about a week after the person is infected. Symptoms are usually mild. They may include:  Fever.  Rash.  Red eyes.  Joint pain. HOW DOES ZIKA VIRUS DISEASE SPREAD? The main way that Zika virus spreads is through the bite of a certain type of mosquito. Unlike most types of mosquitos, which bite only at night, the type of mosquito that carries Zika virus bites both at night and during the day. Zika virus can also spread through sexual contact, through a blood transfusion, and from a mother to her baby before or during birth. Once you have had Zika virus disease, it is unlikely that you will get it again. CAN I PASS ZIKA TO MY BABY DURING PREGNANCY? Yes, Zika can pass from a mother to her baby before or during birth. WHAT PROBLEMS CAN ZIKA CAUSE FOR MY BABY? A woman who is infected with Zika virus while pregnant is at risk of having her baby born with a condition in which the brain or head is smaller than expected (microcephaly). Babies who have microcephaly can have developmental delays, seizures, hearing problems, and vision problems. Having Zika virus disease during pregnancy can also increase the risk of miscarriage. HOW CAN ZIKA VIRUS DISEASE BE  PREVENTED? There is no vaccine to prevent Zika. The best way to prevent the disease is to avoid infected mosquitoes and avoid exposure to body fluids that can spread the virus. Avoid any possible exposure to Zika by taking the following precautions. For women and their sex partners:  Avoid traveling to high-risk areas. The locations where Zika is being reported change often. To identify high-risk areas, check the CDC travel website: www.cdc.gov/zika/geo/index.html  If you or your sex partner must travel to a high-risk area, talk with a health care provider before and after traveling.  Take all precautions to avoid mosquito bites if you live in, or travel to, any of the high-risk areas. Insect repellents are safe to use during pregnancy.  Ask your health care provider when it is safe to have sexual contact. For women:  If you are pregnant or trying to become pregnant, avoid sexual contact with persons who may have been exposed to Zika virus, persons who have possible symptoms of Zika, or persons whose history you are unsure about. If you choose to have sexual contact with someone who may have been exposed to Zika virus, use condoms correctly during the entire duration of sexual activity, every time. Do not share sexual devices, as you may be exposed to body fluids.  Ask your health care provider about when it is safe to attempt pregnancy after a possible exposure to Zika virus. WHAT STEPS SHOULD I TAKE TO AVOID MOSQUITO BITES? Take these steps to avoid mosquito bites when you are   in a high-risk area:  Wear loose clothing that covers your arms and legs.  Limit your outdoor activities.  Do not open windows unless they have window screens.  Sleep under mosquito nets.  Use insect repellent. The best insect repellents have:  DEET, picaridin, oil of lemon eucalyptus (OLE), or IR3535 in them.  Higher amounts of an active ingredient in them.  Remember that insect repellents are safe to use  during pregnancy.  Do not use OLE on children who are younger than 3 years of age. Do not use insect repellent on babies who are younger than 2 months of age.  Cover your child's stroller with mosquito netting. Make sure the netting fits snugly and that any loose netting does not cover your child's mouth or nose. Do not use a blanket as a mosquito-protection cover.  Do not apply insect repellent underneath clothing.  If you are using sunscreen, apply the sunscreen before applying the insect repellent.  Treat clothing with permethrin. Do not apply permethrin directly to your skin. Follow label directions for safe use.  Get rid of standing water, where mosquitoes may reproduce. Standing water is often found in items such as buckets, bowls, animal food dishes, and flowerpots. When you return from traveling to any high-risk area, continue taking actions to protect yourself against mosquito bites for 3 weeks, even if you show no signs of illness. This will prevent spreading Zika virus to uninfected mosquitoes. WHAT SHOULD I KNOW ABOUT THE SEXUAL TRANSMISSION OF ZIKA? People can spread Zika to their sexual partners during vaginal, anal, or oral sex, or by sharing sexual devices. Many people with Zika do not develop symptoms, so a person could spread the disease without knowing that they are infected. The greatest risk is to women who are pregnant or who may become pregnant. Zika virus can live longer in semen than it can live in blood. Couples can prevent sexual transmission of the virus by:  Using condoms correctly during the entire duration of sexual activity, every time. This includes vaginal, anal, and oral sex.  Not sharing sexual devices. Sharing increases your risk of being exposed to body fluid from another person.  Avoiding all sexual activity until your health care provider says it is safe. SHOULD I BE TESTED FOR ZIKA VIRUS? A sample of your blood can be tested for Zika virus. A pregnant  woman should be tested if she may have been exposed to the virus or if she has symptoms of Zika. She may also have additional tests done during her pregnancy, such ultrasound testing. Talk with your health care provider about which tests are recommended.   This information is not intended to replace advice given to you by your health care provider. Make sure you discuss any questions you have with your health care provider.   Document Released: 09/02/2015 Document Reviewed: 08/26/2015 Elsevier Interactive Patient Education 2016 [employer] Minor Illnesses and Medications in Pregnancy  Cold/Flu:  Sudafed for congestion- Robitussin (plain) for cough- Tylenol for discomfort.  Please follow the directions on the label.  Try not to take any more than needed.  OTC Saline nasal spray and air humidifier or cool-mist  Vaporizer to sooth nasal irritation and to loosen congestion.  It is also important to increase intake of non carbonated fluids, especially if you have a fever.  Constipation:  Colace-2 capsules at bedtime; Metamucil- follow directions on label; Senokot- 1 tablet at bedtime.  Any one of these medications can be used.  It is also   very important to increase fluids and fruits along with regular exercise.  If problem persists please call the office.  Diarrhea:  Kaopectate as directed on the label.  Eat a bland diet and increase fluids.  Avoid highly seasoned foods.  Headache:  Tylenol 1 or 2 tablets every 3-4 hours as needed  Indigestion:  Maalox, Mylanta, Tums or Rolaids- as directed on label.  Also try to eat small meals and avoid fatty, greasy or spicy foods.  Nausea with or without Vomiting:  Nausea in pregnancy is caused by increased levels of hormones in the body which influence the digestive system and cause irritation when stomach acids accumulate.  Symptoms usually subside after 1st trimester of pregnancy.  Try the following:  Keep saltines, graham crackers or dry toast by your bed  to eat upon awakening.  Don't let your stomach get empty.  Try to eat 5-6 small meals per day instead of 3 large ones.  Avoid greasy fatty or highly seasoned foods.   Take OTC Unisom 1 tablet at bed time along with OTC Vitamin B6 25-50 mg 3 times per day.    If nausea continues with vomiting and you are unable to keep down food and fluids you may need a prescription medication.  Please notify your provider.   Sore throat:  Chloraseptic spray, throat lozenges and or plain Tylenol.  Vaginal Yeast Infection:  OTC Monistat for 7 days as directed on label.  If symptoms do not resolve within a week notify provider.  If any of the above problems do not subside with recommended treatment please call the office for further assistance.   Do not take Aspirin, Advil, Motrin or Ibuprofen.  * * OTC= Over the counter Hyperemesis Gravidarum Hyperemesis gravidarum is a severe form of nausea and vomiting that happens during pregnancy. Hyperemesis is worse than morning sickness. It may cause you to have nausea or vomiting all day for many days. It may keep you from eating and drinking enough food and liquids. Hyperemesis usually occurs during the first half (the first 20 weeks) of pregnancy. It often goes away once a woman is in her second half of pregnancy. However, sometimes hyperemesis continues through an entire pregnancy.  CAUSES  The cause of this condition is not completely known but is thought to be related to changes in the body's hormones when pregnant. It could be from the high level of the pregnancy hormone or an increase in estrogen in the body.  SIGNS AND SYMPTOMS   Severe nausea and vomiting.  Nausea that does not go away.  Vomiting that does not allow you to keep any food down.  Weight loss and body fluid loss (dehydration).  Having no desire to eat or not liking food you have previously enjoyed. DIAGNOSIS  Your health care provider will do a physical exam and ask you about your symptoms.  He or she may also order blood tests and urine tests to make sure something else is not causing the problem.  TREATMENT  You may only need medicine to control the problem. If medicines do not control the nausea and vomiting, you will be treated in the hospital to prevent dehydration, increased acid in the blood (acidosis), weight loss, and changes in the electrolytes in your body that may harm the unborn baby (fetus). You may need IV fluids.  HOME CARE INSTRUCTIONS   Only take over-the-counter or prescription medicines as directed by your health care provider.  Try eating a couple of dry crackers or   toast in the morning before getting out of bed.  Avoid foods and smells that upset your stomach.  Avoid fatty and spicy foods.  Eat 5-6 small meals a day.  Do not drink when eating meals. Drink between meals.  For snacks, eat high-protein foods, such as cheese.  Eat or suck on things that have ginger in them. Ginger helps nausea.  Avoid food preparation. The smell of food can spoil your appetite.  Avoid iron pills and iron in your multivitamins until after 3-4 months of being pregnant. However, consult with your health care provider before stopping any prescribed iron pills. SEEK MEDICAL CARE IF:   Your abdominal pain increases.  You have a severe headache.  You have vision problems.  You are losing weight. SEEK IMMEDIATE MEDICAL CARE IF:   You are unable to keep fluids down.  You vomit blood.  You have constant nausea and vomiting.  You have excessive weakness.  You have extreme thirst.  You have dizziness or fainting.  You have a fever or persistent symptoms for more than 2-3 days.  You have a fever and your symptoms suddenly get worse. MAKE SURE YOU:   Understand these instructions.  Will watch your condition.  Will get help right away if you are not doing well or get worse.   This information is not intended to replace advice given to you by your health care  provider. Make sure you discuss any questions you have with your health care provider.   Document Released: 12/12/2005 Document Revised: 10/02/2013 Document Reviewed: 07/24/2013 Elsevier Interactive Patient Education 2016 [employer]vier Inc. Commonly Asked Questions During Pregnancy  Cats: A parasite can be excreted in cat feces.  To avoid exposure you need to have another person empty the little box.  If you must empty the litter box you will need to wear gloves.  Wash your hands after handling your cat.  This parasite can also be found in raw or undercooked meat so this should also be avoided.  Colds, Sore Throats, Flu: Please check your medication sheet to see what you can take for symptoms.  If your symptoms are unrelieved by these medications please call the office.  Dental Work: Most any dental work your dentist recommends is permitted.  X-rays should only be taken during the first trimester if absolutely necessary.  Your abdomen should be shielded with a lead apron during all x-rays.  Please notify your provider prior to receiving any x-rays.  Novocaine is fine; gas is not recommended.  If your dentist requires a note from us prior to dental work please call the office and we will provide one for you.  Exercise: Exercise is an important part of staying healthy during your pregnancy.  You may continue most exercises you were accustomed to prior to pregnancy.  Later in your pregnancy you will most likely notice you have difficulty with activities requiring balance like riding a bicycle.  It is important that you listen to your body and avoid activities that put you at a higher risk of falling.  Adequate rest and staying well hydrated are a must!  If you have questions about the safety of specific activities ask your provider.    Exposure to Children with illness: Try to avoid obvious exposure; report any symptoms to us when noted,  If you have chicken pos, red measles or mumps, you should be immune to  these diseases.   Please do not take any vaccines while pregnant unless you have checked with   your OB provider.  Fetal Movement: After 28 weeks we recommend you do "kick counts" twice daily.  Lie or sit down in a calm quiet environment and count your baby movements "kicks".  You should feel your baby at least 10 times per hour.  If you have not felt 10 kicks within the first hour get up, walk around and have something sweet to eat or drink then repeat for an additional hour.  If count remains less than 10 per hour notify your provider.  Fumigating: Follow your pest control agent's advice as to how long to stay out of your home.  Ventilate the area well before re-entering.  Hemorrhoids:   Most over-the-counter preparations can be used during pregnancy.  Check your medication to see what is safe to use.  It is important to use a stool softener or fiber in your diet and to drink lots of liquids.  If hemorrhoids seem to be getting worse please call the office.   Hot Tubs:  Hot tubs Jacuzzis and saunas are not recommended while pregnant.  These increase your internal body temperature and should be avoided.  Intercourse:  Sexual intercourse is safe during pregnancy as long as you are comfortable, unless otherwise advised by your provider.  Spotting may occur after intercourse; report any bright red bleeding that is heavier than spotting.  Labor:  If you know that you are in labor, please go to the hospital.  If you are unsure, please call the office and let us help you decide what to do.  Lifting, straining, etc:  If your job requires heavy lifting or straining please check with your provider for any limitations.  Generally, you should not lift items heavier than that you can lift simply with your hands and arms (no back muscles)  Painting:  Paint fumes do not harm your pregnancy, but may make you ill and should be avoided if possible.  Latex or water based paints have less odor than oils.  Use adequate  ventilation while painting.  Permanents & Hair Color:  Chemicals in hair dyes are not recommended as they cause increase hair dryness which can increase hair loss during pregnancy.  " Highlighting" and permanents are allowed.  Dye may be absorbed differently and permanents may not hold as well during pregnancy.  Sunbathing:  Use a sunscreen, as skin burns easily during pregnancy.  Drink plenty of fluids; avoid over heating.  Tanning Beds:  Because their possible side effects are still unknown, tanning beds are not recommended.  Ultrasound Scans:  Routine ultrasounds are performed at approximately 20 weeks.  You will be able to see your baby's general anatomy an if you would like to know the gender this can usually be determined as well.  If it is questionable when you conceived you may also receive an ultrasound early in your pregnancy for dating purposes.  Otherwise ultrasound exams are not routinely performed unless there is a medical necessity.  Although you can request a scan we ask that you pay for it when conducted because insurance does not cover " patient request" scans.  Work: If your pregnancy proceeds without complications you may work until your due date, unless your physician or employer advises otherwise.  Round Ligament Pain/Pelvic Discomfort:  Sharp, shooting pains not associated with bleeding are fairly common, usually occurring in the second trimester of pregnancy.  They tend to be worse when standing up or when you remain standing for long periods of time.  These are the result   of pressure of certain pelvic ligaments called "round ligaments".  Rest, Tylenol and heat seem to be the most effective relief.  As the womb and fetus grow, they rise out of the pelvis and the discomfort improves.  Please notify the office if your pain seems different than that described.  It may represent a more serious condition.   

## 2016-03-29 LAB — CBC WITH DIFFERENTIAL/PLATELET
BASOS: 0 %
Basophils Absolute: 0 10*3/uL (ref 0.0–0.2)
EOS (ABSOLUTE): 0 10*3/uL (ref 0.0–0.4)
EOS: 0 %
HEMOGLOBIN: 12.5 g/dL (ref 11.1–15.9)
Hematocrit: 36.6 % (ref 34.0–46.6)
IMMATURE GRANS (ABS): 0 10*3/uL (ref 0.0–0.1)
IMMATURE GRANULOCYTES: 0 %
LYMPHS ABS: 1.2 10*3/uL (ref 0.7–3.1)
Lymphs: 16 %
MCH: 29.3 pg (ref 26.6–33.0)
MCHC: 34.2 g/dL (ref 31.5–35.7)
MCV: 86 fL (ref 79–97)
MONOCYTES: 7 %
MONOS ABS: 0.6 10*3/uL (ref 0.1–0.9)
NEUTROS PCT: 77 %
Neutrophils Absolute: 6 10*3/uL (ref 1.4–7.0)
PLATELETS: 264 10*3/uL (ref 150–379)
RBC: 4.27 x10E6/uL (ref 3.77–5.28)
RDW: 13.8 % (ref 12.3–15.4)
WBC: 7.8 10*3/uL (ref 3.4–10.8)

## 2016-03-29 LAB — RPR: RPR Ser Ql: NONREACTIVE

## 2016-03-29 LAB — HEPATITIS B SURFACE ANTIGEN: HEP B S AG: NEGATIVE

## 2016-03-29 LAB — ANTIBODY SCREEN: Antibody Screen: NEGATIVE

## 2016-03-29 LAB — VARICELLA ZOSTER ANTIBODY, IGM

## 2016-03-29 LAB — RUBELLA ANTIBODY, IGM

## 2016-03-29 LAB — HIV ANTIBODY (ROUTINE TESTING W REFLEX): HIV SCREEN 4TH GENERATION: NONREACTIVE

## 2016-03-29 LAB — RH TYPE: RH TYPE: NEGATIVE

## 2016-03-29 LAB — HEMOGLOBIN A1C
ESTIMATED AVERAGE GLUCOSE: 103 mg/dL
HEMOGLOBIN A1C: 5.2 % (ref 4.8–5.6)

## 2016-03-29 LAB — ABO

## 2016-03-29 LAB — TSH: TSH: 1.36 u[IU]/mL (ref 0.450–4.500)

## 2016-03-30 LAB — PAIN MGT SCRN (14 DRUGS), UR
Amphetamine Screen, Ur: NEGATIVE ng/mL
BUPRENORPHINE, URINE: NEGATIVE ng/mL
Barbiturate Screen, Ur: NEGATIVE ng/mL
Benzodiazepine Screen, Urine: POSITIVE ng/mL
COCAINE(METAB.) SCREEN, URINE: NEGATIVE ng/mL
CREATININE(CRT), U: 101.3 mg/dL (ref 20.0–300.0)
Cannabinoids Ur Ql Scn: NEGATIVE ng/mL
Fentanyl, Urine: NEGATIVE pg/mL
METHADONE SCREEN, URINE: NEGATIVE ng/mL
Meperidine Screen, Urine: NEGATIVE ng/mL
Opiate Scrn, Ur: NEGATIVE ng/mL
Oxycodone+Oxymorphone Ur Ql Scn: NEGATIVE ng/mL
PCP SCRN UR: NEGATIVE ng/mL
PH UR, DRUG SCRN: 6.3 (ref 4.5–8.9)
PROPOXYPHENE SCREEN: NEGATIVE ng/mL
TRAMADOL UR QL SCN: NEGATIVE ng/mL

## 2016-03-30 LAB — URINALYSIS, ROUTINE W REFLEX MICROSCOPIC
Bilirubin, UA: NEGATIVE
Glucose, UA: NEGATIVE
Ketones, UA: NEGATIVE
NITRITE UA: NEGATIVE
PH UA: 6.5 (ref 5.0–7.5)
PROTEIN UA: NEGATIVE
RBC, UA: NEGATIVE
Specific Gravity, UA: 1.013 (ref 1.005–1.030)
UUROB: 1 mg/dL (ref 0.2–1.0)

## 2016-03-30 LAB — GC/CHLAMYDIA PROBE AMP
Chlamydia trachomatis, NAA: NEGATIVE
NEISSERIA GONORRHOEAE BY PCR: NEGATIVE

## 2016-03-30 LAB — MICROSCOPIC EXAMINATION
Casts: NONE SEEN /lpf
RBC, UA: NONE SEEN /hpf (ref 0–?)

## 2016-03-30 LAB — NICOTINE SCREEN, URINE: Cotinine Ql Scrn, Ur: NEGATIVE ng/mL

## 2016-03-31 LAB — CULTURE, OB URINE

## 2016-03-31 LAB — URINE CULTURE, OB REFLEX

## 2016-04-26 ENCOUNTER — Ambulatory Visit (INDEPENDENT_AMBULATORY_CARE_PROVIDER_SITE_OTHER): Payer: 59 | Admitting: Obstetrics and Gynecology

## 2016-04-26 ENCOUNTER — Encounter: Payer: Self-pay | Admitting: Obstetrics and Gynecology

## 2016-04-26 VITALS — BP 98/63 | HR 63 | Wt 204.5 lb

## 2016-04-26 DIAGNOSIS — Z3403 Encounter for supervision of normal first pregnancy, third trimester: Secondary | ICD-10-CM | POA: Insufficient documentation

## 2016-04-26 DIAGNOSIS — E669 Obesity, unspecified: Secondary | ICD-10-CM | POA: Diagnosis not present

## 2016-04-26 DIAGNOSIS — Z6841 Body Mass Index (BMI) 40.0 and over, adult: Secondary | ICD-10-CM

## 2016-04-26 DIAGNOSIS — Z3401 Encounter for supervision of normal first pregnancy, first trimester: Secondary | ICD-10-CM

## 2016-04-26 DIAGNOSIS — Z2839 Other underimmunization status: Secondary | ICD-10-CM | POA: Insufficient documentation

## 2016-04-26 DIAGNOSIS — Z789 Other specified health status: Secondary | ICD-10-CM

## 2016-04-26 DIAGNOSIS — Z124 Encounter for screening for malignant neoplasm of cervix: Secondary | ICD-10-CM

## 2016-04-26 DIAGNOSIS — O9989 Other specified diseases and conditions complicating pregnancy, childbirth and the puerperium: Secondary | ICD-10-CM

## 2016-04-26 DIAGNOSIS — O09899 Supervision of other high risk pregnancies, unspecified trimester: Secondary | ICD-10-CM | POA: Insufficient documentation

## 2016-04-26 DIAGNOSIS — Z283 Underimmunization status: Secondary | ICD-10-CM | POA: Insufficient documentation

## 2016-04-26 LAB — POCT URINALYSIS DIPSTICK
BILIRUBIN UA: NEGATIVE
GLUCOSE UA: NEGATIVE
KETONES UA: NEGATIVE
Nitrite, UA: NEGATIVE
Protein, UA: NEGATIVE
RBC UA: NEGATIVE
SPEC GRAV UA: 1.01
UROBILINOGEN UA: NEGATIVE
pH, UA: 6.5

## 2016-04-26 NOTE — Progress Notes (Signed)
OBSTETRIC INITIAL PRENATAL VISIT  Subjective:    Rhonda Davies is being seen today for her first obstetrical visit.  This is not a planned pregnancy. She is a G1P0000 female at 1043w3d gestation, Estimated Date of Delivery: 11/05/16 by last menstrual period of 01/30/2016 (exact date). Her obstetrical history is significant for obesity. Relationship with FOB: significant other, living together. Patient does intend to breast feed. Pregnancy history fully reviewed.   Gynecologic History:  Last pap smear was: no prior h/o GYN screening tests.   History of STI's: H/o chlamydia in 02/2016, treated.    Obstetric History   G1   P0   T0   P0   A0   TAB0   SAB0   E0   M0   L0     # Outcome Date GA Lbr Len/2nd Weight Sex Delivery Anes PTL Lv  1 Current               Past Medical History  Diagnosis Date  . Kidney stone   . Left groin pain   . Personal history UTI   . Amenorrhea     Family History  Problem Relation Age of Onset  . Heart disease Father   . Diabetes Maternal Aunt   . Heart disease Maternal Grandfather   . Diabetes Maternal Grandfather   . Diabetes Paternal Grandmother   . Heart disease Paternal Grandfather   . Breast cancer Neg Hx   . Colon cancer Neg Hx   . Ovarian cancer Neg Hx     Past Surgical History  Procedure Laterality Date  . Tonsillectomy and adenoidectomy  2007  . Cyst removed  2013    IN NECK    Social History   Social History  . Marital Status: Single    Spouse Name: N/A  . Number of Children: N/A  . Years of Education: N/A   Occupational History  . day care worker    Social History Main Topics  . Smoking status: Never Smoker   . Smokeless tobacco: Never Used  . Alcohol Use: No  . Drug Use: No  . Sexual Activity:    Partners: Male    Birth Control/ Protection: None   Other Topics Concern  . Not on file   Social History Narrative   Single.    Currently pregnant.   Works as a Building surveyorDaycare Teacher.    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     Enjoys mudding, fishing.      Current Outpatient Prescriptions on File Prior to Visit  Medication Sig Dispense Refill  . Prenatal Multivit-Min-Fe-FA (PRENATAL VITAMINS) 0.8 MG tablet Take 1 tablet by mouth daily. 30 tablet 3   No current facility-administered medications on file prior to visit.    Allergies  Allergen Reactions  . Benzonatate Swelling    Review of Systems General:Not Present- Fever, Weight Loss and Weight Gain. Skin:Not Present- Rash. HEENT:Not Present- Blurred Vision, Headache and Bleeding Gums. Respiratory:Not Present- Difficulty Breathing. Breast:Present - Breast tenderness. Not Present- Breast Mass. Cardiovascular:Not Present- Chest Pain, Elevated Blood Pressure, Fainting / Blacking Out and Shortness of Breath. Gastrointestinal:Present - Nausea and vomiting. Not Present- Abdominal Pain, Constipation.  Female Genitourinary:Not Present- Frequency, Painful Urination, Pelvic Pain, Vaginal Bleeding, Vaginal Discharge, Contractions, regular, Fetal Movements Decreased, Urinary Complaints and Vaginal Fluid. Musculoskeletal:Not Present- Back Pain and Leg Cramps. Neurological:Not Present- Dizziness. Psychiatric:Not Present- Depression.     Objective:   Blood pressure 98/63, pulse 63, weight 204 lb 8 oz (92.761 kg),  last menstrual period 01/30/2016.  Body mass index is 36.23 kg/(m^2).  General Appearance:    Alert, cooperative, no distress, appears stated age, moderately obese  Head:    Normocephalic, without obvious abnormality, atraumatic  Eyes:    PERRL, conjunctiva/corneas clear, EOM's intact, both eyes  Ears:    Normal external ear canals, both ears  Nose:   Nares normal, septum midline, mucosa normal, no drainage or sinus tenderness  Throat:   Lips, mucosa, and tongue normal; teeth and gums normal  Neck:   Supple, symmetrical, trachea midline, no adenopathy; thyroid: no enlargement/tenderness/nodules; no carotid bruit or JVD  Back:     Symmetric,  no curvature, ROM normal, no CVA tenderness  Lungs:     Clear to auscultation bilaterally, respirations unlabored  Chest Wall:    No tenderness or deformity   Heart:    Regular rate and rhythm, S1 and S2 normal, no murmur, rub or gallop  Breast Exam:    No tenderness, masses, or nipple abnormality  Abdomen:     Soft, non-tender, bowel sounds active all four quadrants, no masses, no organomegaly.  FHT 168 bpm (by unofficial scan, unable to find with Dopplers).  Genitalia:    Pelvic:external genitalia normal, vagina without lesions, discharge, or tenderness, rectovaginal septum  normal. Cervix normal in appearance, no cervical motion tenderness, no adnexal masses or tenderness.  Pregnancy positive findings: uterine enlargement: 10-12 wk size, nontender.   Rectal:    Normal external sphincter.  No hemorrhoids appreciated. Internal exam not done.   Extremities:   Extremities normal, atraumatic, no cyanosis or edema  Pulses:   2+ and symmetric all extremities  Skin:   Skin color, texture, turgor normal, no rashes or lesions  Lymph nodes:   Cervical, supraclavicular, and axillary nodes normal  Neurologic:   CNII-XII intact, normal strength, sensation and reflexes throughout     Assessment:    Pregnancy at 12 and 3/7 weeks   Obesity, Class II H/o recent Chlamydia infection, treated Rh negative status.  Rubella non-immune Varicella non-immune  Plan:    Initial labs reviewed. Varicella and Rubella non-immune.  Will vaccinate postpartum.  Prenatal vitamins encouraged. Problem list reviewed and updated. New OB counseling:  The patient has been given an overview regarding routine prenatal care.  Recommendations regarding diet, weight gain, and exercise in pregnancy were given. Should not gain any more than 15-20 lbs this pregnancy . Prenatal testing, optional genetic testing, and ultrasound use in pregnancy were reviewed.  AFP3 discussed: ordered. Benefits of Breast Feeding were discussed. The  patient is encouraged to consider nursing her baby post partum. To complete early glucola by next visit.  Will need Rhogam at 28 weeks.  Medicaid home pregnancy forms completed today.  Follow up in 4 weeks.  50% of 30 min visit spent on counseling and coordination of care.

## 2016-04-27 ENCOUNTER — Other Ambulatory Visit: Payer: Self-pay | Admitting: Obstetrics and Gynecology

## 2016-04-27 DIAGNOSIS — Z3682 Encounter for antenatal screening for nuchal translucency: Secondary | ICD-10-CM

## 2016-04-28 ENCOUNTER — Other Ambulatory Visit: Payer: Self-pay

## 2016-04-28 ENCOUNTER — Ambulatory Visit (INDEPENDENT_AMBULATORY_CARE_PROVIDER_SITE_OTHER): Payer: 59

## 2016-04-28 ENCOUNTER — Other Ambulatory Visit: Payer: 59

## 2016-04-28 DIAGNOSIS — O219 Vomiting of pregnancy, unspecified: Secondary | ICD-10-CM

## 2016-04-28 DIAGNOSIS — O283 Abnormal ultrasonic finding on antenatal screening of mother: Secondary | ICD-10-CM | POA: Diagnosis not present

## 2016-04-28 DIAGNOSIS — E669 Obesity, unspecified: Secondary | ICD-10-CM

## 2016-04-28 DIAGNOSIS — Z3682 Encounter for antenatal screening for nuchal translucency: Secondary | ICD-10-CM

## 2016-04-28 DIAGNOSIS — Z3401 Encounter for supervision of normal first pregnancy, first trimester: Secondary | ICD-10-CM

## 2016-04-28 MED ORDER — PROMETHAZINE HCL 25 MG PO TABS: 25.0000 mg | ORAL_TABLET | Freq: Four times a day (QID) | ORAL | Status: DC | PRN

## 2016-04-28 NOTE — Progress Notes (Signed)
Pt sent in RX for phenergan per protocol.

## 2016-04-29 ENCOUNTER — Other Ambulatory Visit: Payer: Self-pay | Admitting: Obstetrics and Gynecology

## 2016-04-29 LAB — GLUCOSE, 1 HOUR GESTATIONAL: GESTATIONAL DIABETES SCREEN: 111 mg/dL (ref 65–139)

## 2016-05-03 LAB — FIRST TRIMESTER SCREEN W/NT
CRL: 55.1 mm
DIA MOM: 1.18
DIA Value: 253.8 pg/mL
GEST AGE-COLLECT: 12.3 wk
MATERNAL AGE AT EDD: 23.2 a
NUCHAL TRANSLUCENCY: 2 mm
NUMBER OF FETUSES: 1
Nuchal Translucency MoM: 1.38
PAPP-A MoM: 0.38
PAPP-A Value: 228.6 ng/mL
TEST RESULTS: NEGATIVE
Weight: 204 [lb_av]
hCG MoM: 0.87
hCG Value: 67.4 IU/mL

## 2016-05-04 LAB — PAP IG, CT-NG, RFX HPV ASCU
Chlamydia, Nuc. Acid Amp: NEGATIVE
GONOCOCCUS BY NUCLEIC ACID AMP: NEGATIVE
PAP SMEAR COMMENT: 0

## 2016-05-04 LAB — HPV DNA PROBE HIGH RISK, AMPLIFIED: HPV, HIGH-RISK: POSITIVE — AB

## 2016-05-05 ENCOUNTER — Encounter: Payer: Self-pay | Admitting: Obstetrics and Gynecology

## 2016-05-05 DIAGNOSIS — R87610 Atypical squamous cells of undetermined significance on cytologic smear of cervix (ASC-US): Secondary | ICD-10-CM | POA: Insufficient documentation

## 2016-05-05 DIAGNOSIS — R87810 Cervical high risk human papillomavirus (HPV) DNA test positive: Secondary | ICD-10-CM

## 2016-05-25 ENCOUNTER — Ambulatory Visit (INDEPENDENT_AMBULATORY_CARE_PROVIDER_SITE_OTHER): Payer: 59 | Admitting: Obstetrics and Gynecology

## 2016-05-25 VITALS — BP 113/80 | HR 94 | Wt 206.1 lb

## 2016-05-25 DIAGNOSIS — R519 Headache, unspecified: Secondary | ICD-10-CM

## 2016-05-25 DIAGNOSIS — O219 Vomiting of pregnancy, unspecified: Secondary | ICD-10-CM

## 2016-05-25 DIAGNOSIS — Z3402 Encounter for supervision of normal first pregnancy, second trimester: Secondary | ICD-10-CM

## 2016-05-25 DIAGNOSIS — IMO0002 Reserved for concepts with insufficient information to code with codable children: Secondary | ICD-10-CM

## 2016-05-25 DIAGNOSIS — E669 Obesity, unspecified: Secondary | ICD-10-CM

## 2016-05-25 DIAGNOSIS — R896 Abnormal cytological findings in specimens from other organs, systems and tissues: Secondary | ICD-10-CM

## 2016-05-25 DIAGNOSIS — R51 Headache: Secondary | ICD-10-CM

## 2016-05-25 LAB — POCT URINALYSIS DIPSTICK
Bilirubin, UA: NEGATIVE
Blood, UA: NEGATIVE
GLUCOSE UA: NEGATIVE
Ketones, UA: NEGATIVE
NITRITE UA: NEGATIVE
PROTEIN UA: NEGATIVE
Spec Grav, UA: <=1.005
UROBILINOGEN UA: NEGATIVE
pH, UA: 7.5

## 2016-05-25 MED ORDER — CITRANATAL B-CALM 20-1 MG & 2 X 25 MG PO MISC: 20.0000 mg | Freq: Every day | ORAL | Status: DC

## 2016-05-26 ENCOUNTER — Telehealth: Payer: Self-pay | Admitting: Obstetrics and Gynecology

## 2016-05-26 NOTE — Telephone Encounter (Signed)
PRENATALS WERE NOT SENT TO [facility] [address]

## 2016-05-27 NOTE — Telephone Encounter (Signed)
[facility] [address], [facility]

## 2016-05-29 DIAGNOSIS — IMO0002 Reserved for concepts with insufficient information to code with codable children: Secondary | ICD-10-CM | POA: Insufficient documentation

## 2016-05-29 NOTE — Progress Notes (Signed)
ROB: Patient reports having nausea/vomiting, not relieved by Diclegis.  Given prescription for Phenergan. Advised on adequate hydration. Also changed PNV to Citrinatal B-calm. Discussed abnormal pap smear (ASCUS, HR HPV+), will need repeat pap smear in 1 year. Advised on Tylenol prn for headaches. Normal 1st trimester screen.  RTC in 4 weeks, for anatomy scan at that time. Will hold on early glucola due to nausea/vomiting.

## 2016-06-03 NOTE — Telephone Encounter (Signed)
Rhonda Davies, did we ever resolve this?

## 2016-06-06 NOTE — Telephone Encounter (Signed)
Yes sorry

## 2016-06-23 ENCOUNTER — Ambulatory Visit (INDEPENDENT_AMBULATORY_CARE_PROVIDER_SITE_OTHER): Payer: 59 | Admitting: Obstetrics and Gynecology

## 2016-06-23 ENCOUNTER — Ambulatory Visit (INDEPENDENT_AMBULATORY_CARE_PROVIDER_SITE_OTHER): Payer: 59

## 2016-06-23 VITALS — BP 117/71 | HR 95 | Wt 205.1 lb

## 2016-06-23 DIAGNOSIS — R0989 Other specified symptoms and signs involving the circulatory and respiratory systems: Secondary | ICD-10-CM

## 2016-06-23 DIAGNOSIS — R0600 Dyspnea, unspecified: Secondary | ICD-10-CM

## 2016-06-23 DIAGNOSIS — E669 Obesity, unspecified: Secondary | ICD-10-CM

## 2016-06-23 DIAGNOSIS — O219 Vomiting of pregnancy, unspecified: Secondary | ICD-10-CM

## 2016-06-23 DIAGNOSIS — Z3402 Encounter for supervision of normal first pregnancy, second trimester: Secondary | ICD-10-CM | POA: Diagnosis not present

## 2016-06-23 DIAGNOSIS — R0609 Other forms of dyspnea: Secondary | ICD-10-CM

## 2016-06-23 DIAGNOSIS — R0689 Other abnormalities of breathing: Secondary | ICD-10-CM

## 2016-06-23 LAB — POCT URINALYSIS DIPSTICK
BILIRUBIN UA: NEGATIVE
Blood, UA: NEGATIVE
Glucose, UA: NEGATIVE
Ketones, UA: NEGATIVE
NITRITE UA: NEGATIVE
Protein, UA: NEGATIVE
Spec Grav, UA: 1.01
UROBILINOGEN UA: NEGATIVE
pH, UA: 7

## 2016-06-23 NOTE — Progress Notes (Signed)
ROB:  Notes cold symptoms, not taking anything. Can take Robitussin if needed.  Poor weight gain in pregnancy, advised on Boost/Ensure.  S/p anatomy scan, normal anatomy.  Still with some nausea/vomiting in pregnancy.  Will wait until 28 weeks for glucola. RTC in 4 weeks.

## 2016-07-03 ENCOUNTER — Encounter: Payer: Self-pay | Admitting: *Deleted

## 2016-07-03 ENCOUNTER — Observation Stay
Admission: EM | Admit: 2016-07-03 | Discharge: 2016-07-03 | Disposition: A | Discharge status: Home / Self Care | Payer: 59 | Attending: Obstetrics and Gynecology | Admitting: Obstetrics and Gynecology

## 2016-07-03 ENCOUNTER — Inpatient Hospital Stay
Admission: EM | Admit: 2016-07-03 | Discharge: 2016-07-03 | Disposition: A | Discharge status: Home / Self Care | Payer: Self-pay | Admitting: Obstetrics and Gynecology

## 2016-07-03 DIAGNOSIS — Z3A22 22 weeks gestation of pregnancy: Secondary | ICD-10-CM | POA: Diagnosis not present

## 2016-07-03 DIAGNOSIS — O4692 Antepartum hemorrhage, unspecified, second trimester: Secondary | ICD-10-CM | POA: Diagnosis not present

## 2016-07-03 DIAGNOSIS — Z3490 Encounter for supervision of normal pregnancy, unspecified, unspecified trimester: Secondary | ICD-10-CM

## 2016-07-03 MED ORDER — ACETAMINOPHEN 325 MG PO TABS
650.0000 mg | ORAL_TABLET | Freq: Once | ORAL | Status: AC
  Administered 2016-07-03: 650 mg via ORAL
  Filled 2016-07-03: qty 2

## 2016-07-03 NOTE — Discharge Instructions (Signed)
Nothing in the vagina including sexual intercourse [facility]

## 2016-07-03 NOTE — Final Progress Note (Signed)
L&D OB Triage Note  Rhonda Davies is a 25 y.o. G1P0000 female at 6632w1d, EDD Estimated Date of Delivery: 11/05/16 who presented to triage for complaints of vaginal spotting x 1 day.  Reported intercourse ~ 2 hrs prior to presentation to triage.  She was evaluated by the nurses with no significant findings. No further bleeding was observed while in triage. Vital signs stable. An NST unable to be performed due to gestational age, however FHT were noted.     NST INTERPRETATION: Indications: vaginal bleeding  Mode: External Baseline Rate (A): 144 bpm Variability: Moderate         Contraction Frequency (min): none    Plan: FHT noted. She was discharged home with bleeding/PTL precautions. Advised on pelvic rest x 1-2 weeks. WIll need treatment with Rhogam for bleeding.  Continue routine prenatal care. Follow up with OB/GYN as previously scheduled.     Hildred LaserAnika Brittinee Risk, MD [facility]

## 2016-07-03 NOTE — OB Triage Note (Signed)
Recvd pt from ED. Pt C/O vaginal spotting when wiping after sexual intercourse 2 hours ago. Mild cramping and round ligament pain.

## 2016-07-04 ENCOUNTER — Telehealth: Payer: Self-pay | Admitting: Obstetrics and Gynecology

## 2016-07-04 NOTE — Telephone Encounter (Signed)
Called pt she states that she was told by ED over the weekend that she should not be doing heavy lifting. 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 Advised pt that our general recommendation is not to lift greater than 15lbs in pregnancy and I would provide her employer with a note stating such. Letter to be faxed to [phone].

## 2016-07-04 NOTE — Telephone Encounter (Signed)
Pt called and left VM on office machine stating that she was seen in the ER on Saturday and stated she had some questions.

## 2016-07-05 ENCOUNTER — Telehealth: Payer: Self-pay

## 2016-07-05 ENCOUNTER — Ambulatory Visit (INDEPENDENT_AMBULATORY_CARE_PROVIDER_SITE_OTHER): Payer: 59 | Admitting: Obstetrics and Gynecology

## 2016-07-05 VITALS — BP 115/73 | HR 86 | Wt 208.1 lb

## 2016-07-05 DIAGNOSIS — O360121 Maternal care for anti-D [Rh] antibodies, second trimester, fetus 1: Secondary | ICD-10-CM

## 2016-07-05 MED ORDER — RHO D IMMUNE GLOBULIN 1500 UNITS IM SOSY
1500.0000 [IU] | PREFILLED_SYRINGE | Freq: Once | INTRAMUSCULAR | Status: AC
  Administered 2016-07-05: 1500 [IU] via INTRAMUSCULAR

## 2016-07-05 NOTE — Telephone Encounter (Signed)
-----   Message from Hildred LaserAnika Cherry, MD sent at 07/04/2016  7:54 PM EDT ----- Regarding: needs rhogam Can you contact this patient and have her come in for a Rhogam shot? Had some bleeding over the weekend (got called in the middle of the night), was seen in triage, nurse forgot to mention that she was Rh neg.   Thanks.

## 2016-07-05 NOTE — Progress Notes (Signed)
After obtaining consent, and per orders of Dr. Valentino Saxonherry, injection of Rhogram given by [place] due to bleeding in the Ed over the weekend. Pt states she is complaint with pelvic rest. Denies bleeding since that time. Patient instructed to remain in clinic for 20 minutes afterwards, and to report any adverse reaction to me immediately. Pt tolerated injection well.   Reviewed and concur Herold HarmsMartin A Rashaun Wichert, MD

## 2016-07-05 NOTE — Patient Instructions (Signed)
Follow up as scheduled.  

## 2016-07-05 NOTE — Telephone Encounter (Signed)
Called pt advised her of the need for rhogam injection, pt transferred to reception to be scheduled.

## 2016-07-26 ENCOUNTER — Ambulatory Visit (INDEPENDENT_AMBULATORY_CARE_PROVIDER_SITE_OTHER): Payer: 59 | Admitting: Obstetrics and Gynecology

## 2016-07-26 VITALS — BP 102/66 | HR 88 | Wt 211.3 lb

## 2016-07-26 DIAGNOSIS — Z3482 Encounter for supervision of other normal pregnancy, second trimester: Secondary | ICD-10-CM

## 2016-07-26 DIAGNOSIS — O4692 Antepartum hemorrhage, unspecified, second trimester: Secondary | ICD-10-CM

## 2016-07-26 DIAGNOSIS — Z3492 Encounter for supervision of normal pregnancy, unspecified, second trimester: Secondary | ICD-10-CM

## 2016-07-26 LAB — POCT URINALYSIS DIPSTICK
BILIRUBIN UA: NEGATIVE
GLUCOSE UA: NEGATIVE
Ketones, UA: NEGATIVE
NITRITE UA: NEGATIVE
Protein, UA: NEGATIVE
RBC UA: NEGATIVE
Spec Grav, UA: 1.015
UROBILINOGEN UA: NEGATIVE
pH, UA: 7

## 2016-07-26 NOTE — Progress Notes (Signed)
ROB: Patient denies no further bleeding since episode last month.  Has received Rhogam.  Early glucola which was normal, will need repeat in third trimester.  RTC in 4 weeks.  For 28 week labs at that time.

## 2016-08-22 ENCOUNTER — Observation Stay
Admission: EM | Admit: 2016-08-22 | Discharge: 2016-08-22 | Disposition: A | Discharge status: Home / Self Care | Payer: 59 | Attending: Obstetrics and Gynecology | Admitting: Obstetrics and Gynecology

## 2016-08-22 ENCOUNTER — Encounter: Payer: Self-pay | Admitting: *Deleted

## 2016-08-22 DIAGNOSIS — Z888 Allergy status to other drugs, medicaments and biological substances status: Secondary | ICD-10-CM | POA: Diagnosis not present

## 2016-08-22 DIAGNOSIS — Z3A29 29 weeks gestation of pregnancy: Secondary | ICD-10-CM | POA: Insufficient documentation

## 2016-08-22 DIAGNOSIS — O26893 Other specified pregnancy related conditions, third trimester: Principal | ICD-10-CM | POA: Insufficient documentation

## 2016-08-22 DIAGNOSIS — Z87442 Personal history of urinary calculi: Secondary | ICD-10-CM | POA: Diagnosis not present

## 2016-08-22 LAB — URINALYSIS COMPLETE WITH MICROSCOPIC (ARMC ONLY)
BILIRUBIN URINE: NEGATIVE
Glucose, UA: NEGATIVE mg/dL
Hgb urine dipstick: NEGATIVE
Ketones, ur: NEGATIVE mg/dL
Nitrite: NEGATIVE
PH: 7 (ref 5.0–8.0)
PROTEIN: NEGATIVE mg/dL
Specific Gravity, Urine: 1.006 (ref 1.005–1.030)

## 2016-08-22 LAB — TYPE AND SCREEN
ABO/RH(D): A NEG
Antibody Screen: NEGATIVE

## 2016-08-22 NOTE — Discharge Summary (Signed)
Discharge instructions reviewed with patient including follow up appointments, labor precautions and when to seek medical attention. All questions answered. Patient discharged via wheelchair, escorted by family member and nursing staff. No signs of distress observed at time of discharge.

## 2016-08-22 NOTE — Final Progress Note (Signed)
Physician Final Progress Note  Patient ID: Rhonda Davies MRN: [id number] DOB/AGE: 18-Jul-1991 24 y.o.  Admit date: 08/22/2016 Admitting provider: Conard Novak, MD Discharge date: 08/22/2016  Admission Diagnoses:  1) intrauterine pregnancy at [redacted]w[redacted]d  2) acute-onset lower abdominal pain  Discharge Diagnoses:  1) intrauterine pregnancy at [redacted]w[redacted]d  2) acute-onset lower abdominal pain  History of Present Illness: The patient is a 25 y.o. female G1P0000 at [redacted]w[redacted]d who presents for acute-onset lower abdominal pain. 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  The pain is sharp, does not radiate, nothing so far makes it better or worse.  No associated symptoms.  No vaginal bleeding, no LOF, no contractions. +FM.  Denies urinary and vaginal symptoms.  Pain improved with local heat therapy.  Discussed pelvic x-ray and mutual decision made to not obtain one at this juncture.  Patient discharged with instructions to use tylenol (000mg /day), ibuprofen (200mg /day for no more than 3 days).  Local heat and cold application. Keep f/u appointment with Dr. Valentino Saxon on 9/5.   Past Medical History:  Diagnosis Date  . Amenorrhea   . Kidney stone   . Left groin pain   . Personal history UTI     Past Surgical History:  Procedure Laterality Date  . CYST REMOVED  2013   IN NECK  . TONSILLECTOMY AND ADENOIDECTOMY  2007    No current facility-administered medications on file prior to encounter.    Current Outpatient Prescriptions on File Prior to Encounter  Medication Sig Dispense Refill  . Prenat w/o A FeCbnFeGlu-FA &B6 (CITRANATAL B-CALM) 20-1 & 25 (2) MG MISC Take 20 mg by mouth daily. 30 each 11    Allergies  Allergen Reactions  . Benzonatate Swelling    Social History   Social History  . Marital status: Single    Spouse name: N/A  . Number of children: N/A  . Years of education: N/A   Occupational History  . day care worker    Social History  Main Topics  . Smoking status: Never Smoker  . Smokeless tobacco: Never Used  . Alcohol use No  . Drug use: No  . Sexual activity: Yes    Partners: Male    Birth control/ protection: None   Other Topics Concern  . Not on file   Social History Narrative   Single.    Currently pregnant.   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   Enjoys mudding, fishing.     Physical Exam: BP 125/74 (BP Location: Left Arm)   Pulse 80   Temp 98.4 F (36.9 C) (Oral)   Resp 18   Ht 5\' 5"  (1.651 m)   Wt 96.6 kg (213 lb)   LMP 01/30/2016 (Exact Date)   BMI 35.45 kg/m   Gen: NAD CV: RRR Pulm: CTAB Abd: soft, ttp over pubic symphysis. +BS. No rebound or guarding.  No uterine tenderness. Pelvic: deferred Ext: no e/c/t  Consults: None  Significant Findings/ Diagnostic Studies:  Lab Results  Component Value Date   APPEARANCEUR HAZY (A) 08/22/2016   GLUCOSEU NEGATIVE 08/22/2016   BILIRUBINUR NEGATIVE 08/22/2016   KETONESUR NEGATIVE 08/22/2016   LABSPEC 1.006 08/22/2016   HGBUR NEGATIVE 08/22/2016   PHURINE 7.0 08/22/2016   NITRITE NEGATIVE 08/22/2016   LEUKOCYTESUR 2+ (A) 08/22/2016   RBCU 0-5 08/22/2016   WBCU 0-5 08/22/2016   BACTERIA RARE (A) 08/22/2016   EPIU 0-5 (A) 08/22/2016  MUCOUSUACOMP PRESENT 08/22/2016   CAOXCRYSTALS PRESENT 03/15/2016     Procedures: NST - reactive for gestational age  Discharge Condition: stable  Disposition: 01-Home or Self Care  Diet: Regular diet  Discharge Activity: Activity as tolerated     Medication List    TAKE these medications   CITRANATAL B-CALM 20-1 & 25 (2) MG Misc Take 20 mg by mouth daily.        Total time spent taking care of this patient: 30 minutes  Signed: Conard NovakJackson, Gemini Bunte D, MD  08/22/2016, 6:24 PM

## 2016-08-23 ENCOUNTER — Other Ambulatory Visit: Payer: 59

## 2016-08-23 DIAGNOSIS — Z3492 Encounter for supervision of normal pregnancy, unspecified, second trimester: Secondary | ICD-10-CM

## 2016-08-24 LAB — CBC
HEMOGLOBIN: 12 g/dL (ref 11.1–15.9)
Hematocrit: 35.3 % (ref 34.0–46.6)
MCH: 30.6 pg (ref 26.6–33.0)
MCHC: 34 g/dL (ref 31.5–35.7)
MCV: 90 fL (ref 79–97)
Platelets: 282 10*3/uL (ref 150–379)
RBC: 3.92 x10E6/uL (ref 3.77–5.28)
RDW: 12.8 % (ref 12.3–15.4)
WBC: 10 10*3/uL (ref 3.4–10.8)

## 2016-08-24 LAB — GLUCOSE, 1 HOUR GESTATIONAL: Gestational Diabetes Screen: 113 mg/dL (ref 65–139)

## 2016-08-30 ENCOUNTER — Ambulatory Visit (INDEPENDENT_AMBULATORY_CARE_PROVIDER_SITE_OTHER): Payer: 59 | Admitting: Obstetrics and Gynecology

## 2016-08-30 ENCOUNTER — Encounter: Payer: 59 | Admitting: Obstetrics and Gynecology

## 2016-08-30 VITALS — BP 113/71 | HR 89 | Wt 219.5 lb

## 2016-08-30 DIAGNOSIS — O9989 Other specified diseases and conditions complicating pregnancy, childbirth and the puerperium: Secondary | ICD-10-CM

## 2016-08-30 DIAGNOSIS — Z3403 Encounter for supervision of normal first pregnancy, third trimester: Secondary | ICD-10-CM

## 2016-08-30 DIAGNOSIS — O26899 Other specified pregnancy related conditions, unspecified trimester: Secondary | ICD-10-CM

## 2016-08-30 DIAGNOSIS — R109 Unspecified abdominal pain: Secondary | ICD-10-CM

## 2016-08-30 DIAGNOSIS — E669 Obesity, unspecified: Secondary | ICD-10-CM

## 2016-08-30 LAB — POCT URINALYSIS DIPSTICK
BILIRUBIN UA: NEGATIVE
Glucose, UA: NEGATIVE
Ketones, UA: NEGATIVE
NITRITE UA: NEGATIVE
PH UA: 7
PROTEIN UA: NEGATIVE
RBC UA: NEGATIVE
Spec Grav, UA: 1.015
UROBILINOGEN UA: NEGATIVE

## 2016-08-30 NOTE — Progress Notes (Signed)
ROB: Patient notes being seen in triage last week for sharp sudden onset of pelvic pain while at work.  Diagnosis was likely musculoskeletal.  Discussed belly band with patient, Tylenol prn, warm baths. Notes pain has improved some this week as she is taking it easy at work.  Repeat glucola wnl. For Tdap, sign blood consent next visit.  Will need next dose of Rhogam 10/05/16.

## 2016-08-30 NOTE — Progress Notes (Deleted)
Belly band

## 2016-09-21 ENCOUNTER — Ambulatory Visit (INDEPENDENT_AMBULATORY_CARE_PROVIDER_SITE_OTHER): Payer: 59 | Admitting: Obstetrics and Gynecology

## 2016-09-21 VITALS — BP 117/75 | HR 75 | Wt 222.2 lb

## 2016-09-21 DIAGNOSIS — Z3403 Encounter for supervision of normal first pregnancy, third trimester: Secondary | ICD-10-CM | POA: Diagnosis not present

## 2016-09-21 DIAGNOSIS — Z23 Encounter for immunization: Secondary | ICD-10-CM | POA: Diagnosis not present

## 2016-09-21 DIAGNOSIS — E669 Obesity, unspecified: Secondary | ICD-10-CM

## 2016-09-21 DIAGNOSIS — M7989 Other specified soft tissue disorders: Secondary | ICD-10-CM

## 2016-09-21 LAB — POCT URINALYSIS DIPSTICK
BILIRUBIN UA: NEGATIVE
GLUCOSE UA: NEGATIVE
Ketones, UA: NEGATIVE
NITRITE UA: NEGATIVE
Spec Grav, UA: 1.02
Urobilinogen, UA: NEGATIVE
pH, UA: 6

## 2016-09-21 MED ORDER — TETANUS-DIPHTH-ACELL PERTUSSIS 5-2.5-18.5 LF-MCG/0.5 IM SUSP
0.5000 mL | Freq: Once | INTRAMUSCULAR | Status: AC
  Administered 2016-09-21: 0.5 mL via INTRAMUSCULAR

## 2016-09-21 NOTE — Progress Notes (Signed)
ROB: Patient doing well, notes bilateral foot swelling.  Discussed elevating feet, compression stockings.  For 28 week labs today.  Desires to breast and bottle feed,  Desires OCPs for contraception. For Tdap today, signed blood consent, discussed cord blood banking. Declines flu vaccine.  Needs Rhogam next visit.

## 2016-10-04 ENCOUNTER — Ambulatory Visit (INDEPENDENT_AMBULATORY_CARE_PROVIDER_SITE_OTHER): Payer: 59 | Admitting: Obstetrics and Gynecology

## 2016-10-04 VITALS — BP 134/82 | HR 69 | Wt 219.6 lb

## 2016-10-04 DIAGNOSIS — Z87440 Personal history of urinary (tract) infections: Secondary | ICD-10-CM

## 2016-10-04 DIAGNOSIS — O09893 Supervision of other high risk pregnancies, third trimester: Secondary | ICD-10-CM

## 2016-10-04 DIAGNOSIS — Z3403 Encounter for supervision of normal first pregnancy, third trimester: Secondary | ICD-10-CM

## 2016-10-04 DIAGNOSIS — O26893 Other specified pregnancy related conditions, third trimester: Secondary | ICD-10-CM

## 2016-10-04 DIAGNOSIS — Z6791 Unspecified blood type, Rh negative: Secondary | ICD-10-CM

## 2016-10-04 DIAGNOSIS — O1213 Gestational proteinuria, third trimester: Secondary | ICD-10-CM

## 2016-10-04 LAB — POCT URINALYSIS DIPSTICK
BILIRUBIN UA: NEGATIVE
Glucose, UA: NEGATIVE
KETONES UA: NEGATIVE
Nitrite, UA: NEGATIVE
PH UA: 6.5
SPEC GRAV UA: 1.015
Urobilinogen, UA: NEGATIVE

## 2016-10-04 MED ORDER — NITROFURANTOIN MONOHYD MACRO 100 MG PO CAPS: 100.0000 mg | ORAL_CAPSULE | Freq: Two times a day (BID) | ORAL | 1 refills | Status: DC

## 2016-10-04 MED ORDER — RHO D IMMUNE GLOBULIN 1500 UNITS IM SOSY
1500.0000 [IU] | PREFILLED_SYRINGE | Freq: Once | INTRAMUSCULAR | Status: AC
  Administered 2016-10-04: 1500 [IU] via INTRAMUSCULAR

## 2016-10-07 LAB — URINE CULTURE

## 2016-10-07 NOTE — Progress Notes (Signed)
ROB: Patient reports being seen in ED 2 days ago dx with possible gallstones and braxton hicks, but was ruled out and sent home.  [facility]. RTC in 1 week for 36 week labs. Rhogam given today. Continue BP to monitor as patient also with increased proteinuria.

## 2016-10-11 ENCOUNTER — Ambulatory Visit (INDEPENDENT_AMBULATORY_CARE_PROVIDER_SITE_OTHER): Payer: 59 | Admitting: Obstetrics and Gynecology

## 2016-10-11 VITALS — BP 131/76 | HR 76 | Wt 222.3 lb

## 2016-10-11 DIAGNOSIS — Z3685 Encounter for antenatal screening for Streptococcus B: Secondary | ICD-10-CM

## 2016-10-11 DIAGNOSIS — N898 Other specified noninflammatory disorders of vagina: Secondary | ICD-10-CM

## 2016-10-11 DIAGNOSIS — O1213 Gestational proteinuria, third trimester: Secondary | ICD-10-CM

## 2016-10-11 DIAGNOSIS — Z3403 Encounter for supervision of normal first pregnancy, third trimester: Secondary | ICD-10-CM

## 2016-10-11 DIAGNOSIS — Z113 Encounter for screening for infections with a predominantly sexual mode of transmission: Secondary | ICD-10-CM

## 2016-10-11 DIAGNOSIS — R8271 Bacteriuria: Secondary | ICD-10-CM

## 2016-10-11 DIAGNOSIS — O26893 Other specified pregnancy related conditions, third trimester: Secondary | ICD-10-CM

## 2016-10-11 LAB — POCT URINALYSIS DIPSTICK
Bilirubin, UA: NEGATIVE
Glucose, UA: NEGATIVE
Ketones, UA: NEGATIVE
NITRITE UA: NEGATIVE
PH UA: 6.5
Protein, UA: 100
SPEC GRAV UA: 1.02
UROBILINOGEN UA: NEGATIVE

## 2016-10-11 LAB — PROTEIN / CREATININE RATIO, URINE
Creatinine, Urine: 177.6 mg/dL
PROTEIN UR: 77.1 mg/dL
PROTEIN/CREAT RATIO: 434 mg/g{creat} — AB (ref 0–200)

## 2016-10-11 LAB — COMPREHENSIVE METABOLIC PANEL
A/G RATIO: 1.3 (ref 1.2–2.2)
ALT: 18 IU/L (ref 0–32)
AST: 21 IU/L (ref 0–40)
Albumin: 3.2 g/dL — ABNORMAL LOW (ref 3.5–5.5)
Alkaline Phosphatase: 138 IU/L — ABNORMAL HIGH (ref 39–117)
BUN/Creatinine Ratio: 17 (ref 9–23)
BUN: 12 mg/dL (ref 6–20)
Bilirubin Total: 0.5 mg/dL (ref 0.0–1.2)
CALCIUM: 9.1 mg/dL (ref 8.7–10.2)
CO2: 18 mmol/L (ref 18–29)
CREATININE: 0.7 mg/dL (ref 0.57–1.00)
Chloride: 102 mmol/L (ref 96–106)
GFR, EST AFRICAN AMERICAN: 141 mL/min/{1.73_m2} (ref >59)
GFR, EST NON AFRICAN AMERICAN: 123 mL/min/{1.73_m2} (ref >59)
GLOBULIN, TOTAL: 2.5 g/dL (ref 1.5–4.5)
Glucose: 94 mg/dL (ref 65–99)
POTASSIUM: 4.6 mmol/L (ref 3.5–5.2)
SODIUM: 136 mmol/L (ref 134–144)
TOTAL PROTEIN: 5.7 g/dL — AB (ref 6.0–8.5)

## 2016-10-11 LAB — CBC
Hematocrit: 33.6 % — ABNORMAL LOW (ref 34.0–46.6)
Hemoglobin: 12.3 g/dL (ref 11.1–15.9)
MCH: 30.9 pg (ref 26.6–33.0)
MCHC: 36.6 g/dL — AB (ref 31.5–35.7)
MCV: 84 fL (ref 79–97)
PLATELETS: 166 10*3/uL (ref 150–379)
RBC: 3.98 x10E6/uL (ref 3.77–5.28)
RDW: 13 % (ref 12.3–15.4)
WBC: 8.9 10*3/uL (ref 3.4–10.8)

## 2016-10-11 LAB — URIC ACID: Uric Acid: 6.4 mg/dL (ref 2.5–7.1)

## 2016-10-11 LAB — OB RESULTS CONSOLE GBS: GBS: POSITIVE

## 2016-10-11 MED ORDER — AMOXICILLIN 500 MG PO CAPS: 500.0000 mg | ORAL_CAPSULE | Freq: Three times a day (TID) | ORAL | 0 refills | Status: DC

## 2016-10-11 NOTE — Progress Notes (Signed)
ROB: Patient still noting dsuyria symptoms.  Urine Cx returned with 25K-50K GBS.  As patient is symptomatic, will treat with Amoxicillin.  Increased proteinuria today, [facility]  BPs slightly elevated for patient, but still wnl.  Patient with c/o malodorous discharge, loss of mucus plug and scant blood when wiping.  Given PTL precautions, Nuswab performed. 36 week labs done today. RTC in 1 week.

## 2016-10-14 LAB — CULTURE, BETA STREP (GROUP B ONLY): Strep Gp B Culture: POSITIVE — AB

## 2016-10-15 LAB — NUSWAB VAGINITIS PLUS (VG+)
CHLAMYDIA TRACHOMATIS, NAA: NEGATIVE
Candida albicans, NAA: NEGATIVE
Candida glabrata, NAA: NEGATIVE
NEISSERIA GONORRHOEAE, NAA: NEGATIVE
TRICH VAG BY NAA: POSITIVE — AB

## 2016-10-17 ENCOUNTER — Telehealth: Payer: Self-pay

## 2016-10-17 ENCOUNTER — Encounter: Payer: Self-pay | Admitting: Obstetrics and Gynecology

## 2016-10-17 DIAGNOSIS — O23593 Infection of other part of genital tract in pregnancy, third trimester: Secondary | ICD-10-CM

## 2016-10-17 DIAGNOSIS — B951 Streptococcus, group B, as the cause of diseases classified elsewhere: Secondary | ICD-10-CM | POA: Insufficient documentation

## 2016-10-17 DIAGNOSIS — A5901 Trichomonal vulvovaginitis: Secondary | ICD-10-CM | POA: Insufficient documentation

## 2016-10-17 MED ORDER — METRONIDAZOLE 500 MG PO TABS: 500.0000 mg | ORAL_TABLET | Freq: Two times a day (BID) | ORAL | 0 refills | Status: DC

## 2016-10-17 NOTE — Telephone Encounter (Signed)
-----   Message from Hildred LaserAnika Cherry, MD sent at 10/17/2016  9:53 AM EDT ----- Please inform patient of positive BV and trichomonas.  Can treat both with Flagyl 500 mg BID x 7 days.  Inform of safe sex practices and partner treatment. Did not yet release to Mychart.

## 2016-10-17 NOTE — Telephone Encounter (Signed)
Called pt informed her of +BV +Trich informed pt that her partner needs to be treated and no contacted until 7 days post treatment. RX sent in. Pt gave verbal understanding.

## 2016-10-18 ENCOUNTER — Ambulatory Visit (INDEPENDENT_AMBULATORY_CARE_PROVIDER_SITE_OTHER): Payer: 59 | Admitting: Obstetrics and Gynecology

## 2016-10-18 VITALS — BP 139/80 | HR 57 | Wt 226.6 lb

## 2016-10-18 DIAGNOSIS — O1490 Unspecified pre-eclampsia, unspecified trimester: Secondary | ICD-10-CM

## 2016-10-18 DIAGNOSIS — Z3403 Encounter for supervision of normal first pregnancy, third trimester: Secondary | ICD-10-CM

## 2016-10-18 DIAGNOSIS — O1400 Mild to moderate pre-eclampsia, unspecified trimester: Secondary | ICD-10-CM | POA: Insufficient documentation

## 2016-10-18 DIAGNOSIS — A5901 Trichomonal vulvovaginitis: Secondary | ICD-10-CM

## 2016-10-18 DIAGNOSIS — R8271 Bacteriuria: Secondary | ICD-10-CM

## 2016-10-18 DIAGNOSIS — Z87440 Personal history of urinary (tract) infections: Secondary | ICD-10-CM

## 2016-10-18 DIAGNOSIS — O23593 Infection of other part of genital tract in pregnancy, third trimester: Secondary | ICD-10-CM

## 2016-10-18 DIAGNOSIS — E669 Obesity, unspecified: Secondary | ICD-10-CM

## 2016-10-18 LAB — POCT URINALYSIS DIPSTICK
BILIRUBIN UA: NEGATIVE
GLUCOSE UA: NEGATIVE
KETONES UA: NEGATIVE
Nitrite, UA: NEGATIVE
Protein, UA: 300
SPEC GRAV UA: 1.015
Urobilinogen, UA: NEGATIVE
pH, UA: 7

## 2016-10-18 LAB — RUBELLA SCREEN: Rubella Antibodies, IGG: <0.9 index — ABNORMAL LOW (ref >0.99)

## 2016-10-18 LAB — SPECIMEN STATUS REPORT

## 2016-10-18 LAB — VARICELLA ZOSTER ANTIBODY, IGG: Varicella zoster IgG: <135 index — ABNORMAL LOW (ref >165)

## 2016-10-18 NOTE — Progress Notes (Signed)
ROB: Patient notes daily mild headaches.  Has taken OTC Tylenol.  Advised on Tylenol ES.  BPs still mildly elevated for pt (but still w/i "normal" range).  +3 proteinuria noted today.  Patient with likely developing pre-eclampsia.  Given PIH precautions.  Will start NSTs next week, and recheck PIH labs next week. Discussed possibility of IOL next week if BPs elevated or labs show increased abnormality.  Has not yet gotten meds for recent trichomoniasis infection, advised on prompt treatment.  Took Abx for GBS bacteriuria. Also discussed GBS positive status, will need abx in labor. RTC in 1 week.

## 2016-10-18 NOTE — Patient Instructions (Signed)

## 2016-10-21 LAB — URINE CULTURE: ORGANISM ID, BACTERIA: NO GROWTH

## 2016-10-25 ENCOUNTER — Ambulatory Visit (INDEPENDENT_AMBULATORY_CARE_PROVIDER_SITE_OTHER): Payer: 59 | Admitting: Obstetrics and Gynecology

## 2016-10-25 ENCOUNTER — Other Ambulatory Visit: Payer: 59

## 2016-10-25 VITALS — BP 132/86 | HR 68 | Wt 226.7 lb

## 2016-10-25 DIAGNOSIS — O23593 Infection of other part of genital tract in pregnancy, third trimester: Secondary | ICD-10-CM

## 2016-10-25 DIAGNOSIS — Z3403 Encounter for supervision of normal first pregnancy, third trimester: Secondary | ICD-10-CM | POA: Diagnosis not present

## 2016-10-25 DIAGNOSIS — A5901 Trichomonal vulvovaginitis: Secondary | ICD-10-CM

## 2016-10-25 DIAGNOSIS — E669 Obesity, unspecified: Secondary | ICD-10-CM

## 2016-10-25 DIAGNOSIS — O1400 Mild to moderate pre-eclampsia, unspecified trimester: Secondary | ICD-10-CM

## 2016-10-25 DIAGNOSIS — O1490 Unspecified pre-eclampsia, unspecified trimester: Secondary | ICD-10-CM

## 2016-10-25 LAB — HEPATIC FUNCTION PANEL
ALK PHOS: 167 IU/L — AB (ref 39–117)
ALT: 10 IU/L (ref 0–32)
AST: 18 IU/L (ref 0–40)
BILIRUBIN TOTAL: 0.4 mg/dL (ref 0.0–1.2)
BILIRUBIN, DIRECT: 0.12 mg/dL (ref 0.00–0.40)
TOTAL PROTEIN: 5.9 g/dL — AB (ref 6.0–8.5)

## 2016-10-25 LAB — CBC
HEMOGLOBIN: 14.3 g/dL (ref 11.1–15.9)
Hematocrit: 39.7 % (ref 34.0–46.6)
MCH: 31 pg (ref 26.6–33.0)
MCHC: 36 g/dL — ABNORMAL HIGH (ref 31.5–35.7)
MCV: 86 fL (ref 79–97)
PLATELETS: 186 10*3/uL (ref 150–379)
RBC: 4.62 x10E6/uL (ref 3.77–5.28)
RDW: 13.3 % (ref 12.3–15.4)
WBC: 9.9 10*3/uL (ref 3.4–10.8)

## 2016-10-25 LAB — PROTEIN / CREATININE RATIO, URINE
Creatinine, Urine: 226.8 mg/dL
Protein, Ur: <4 mg/dL

## 2016-10-25 LAB — RENAL FUNCTION PANEL
ALBUMIN: 3.2 g/dL — AB (ref 3.5–5.5)
BUN/Creatinine Ratio: 26 — ABNORMAL HIGH (ref 9–23)
BUN: 21 mg/dL — AB (ref 6–20)
CO2: 16 mmol/L — ABNORMAL LOW (ref 18–29)
CREATININE: 0.8 mg/dL (ref 0.57–1.00)
Calcium: 8.5 mg/dL — ABNORMAL LOW (ref 8.7–10.2)
Chloride: 102 mmol/L (ref 96–106)
GFR, EST AFRICAN AMERICAN: 120 mL/min/{1.73_m2} (ref >59)
GFR, EST NON AFRICAN AMERICAN: 104 mL/min/{1.73_m2} (ref >59)
Glucose: 83 mg/dL (ref 65–99)
PHOSPHORUS: 2.8 mg/dL (ref 2.5–4.5)
POTASSIUM: 4.8 mmol/L (ref 3.5–5.2)
Sodium: 135 mmol/L (ref 134–144)

## 2016-10-25 LAB — POCT URINALYSIS DIPSTICK
Bilirubin, UA: NEGATIVE
Glucose, UA: NEGATIVE
KETONES UA: NEGATIVE
Nitrite, UA: NEGATIVE
PH UA: 6.5
SPEC GRAV UA: 1.025
Urobilinogen, UA: NEGATIVE

## 2016-10-25 NOTE — Progress Notes (Signed)
NONSTRESS TEST INTERPRETATION  INDICATIONS: Obesity and Mild Preeclampsia   FHR baseline: 145 RESULTS:Reactive COMMENTS: Rare contractions noted.  BPs 144/82. 137/76, 147/79, 133/72, 131/74   PLAN: 1. Continue fetal kick counts twice a day. 2. Continue antepartum testing as scheduled-Biweekly 3. Scheduled for IOL on Friday, 10/28/2016.  Rhonda Davies, CMA

## 2016-10-25 NOTE — Progress Notes (Signed)
ROB: Patient doing well.  Notes she is completing course of antibiotics for trichomonas today.  NST performed today was reviewed and was found to be reactive.  Continue recommended antenatal testing and prenatal care. Scheduled for IOL on 10/28/16 for pre-eclampsia without severe features.

## 2016-10-25 NOTE — Patient Instructions (Signed)
iLabor Induction Labor induction is when steps are taken to cause a pregnant woman to begin the labor process. Most women go into labor on their own between 37 weeks and 42 weeks of the pregnancy. When this does not happen or when there is a medical need, methods may be used to induce labor. Labor induction causes a pregnant woman's uterus to contract. It also causes the cervix to soften (ripen), open (dilate), and thin out (efface). Usually, labor is not induced before 39 weeks of the pregnancy unless there is a problem with the baby or mother.  Before inducing labor, your health care provider will consider a number of factors, including the following:  The medical condition of you and the baby.   How many weeks along you are.   The status of the baby's lung maturity.   The condition of the cervix.   The position of the baby.  WHAT ARE THE REASONS FOR LABOR INDUCTION? Labor may be induced for the following reasons:  The health of the baby or mother is at risk.   The pregnancy is overdue by 1 week or more.   The water breaks but labor does not start on its own.   The mother has a health condition or serious illness, such as high blood pressure, infection, placental abruption, or diabetes.  The amniotic fluid amounts are low around the baby.   The baby is distressed.  Convenience or wanting the baby to be born on a certain date is not a reason for inducing labor. WHAT METHODS ARE USED FOR LABOR INDUCTION? Several methods of labor induction may be used, such as:   Prostaglandin medicine. This medicine causes the cervix to dilate and ripen. The medicine will also start contractions. It can be taken by mouth or by inserting a suppository into the vagina.   Inserting a thin tube (catheter) with a balloon on the end into the vagina to dilate the cervix. Once inserted, the balloon is expanded with water, which causes the cervix to open.   Stripping the membranes. Your health  care provider separates amniotic sac tissue from the cervix, causing the cervix to be stretched and causing the release of a hormone called progesterone. This may cause the uterus to contract. It is often done during an office visit. You will be sent home to wait for the contractions to begin. You will then come in for an induction.   Breaking the water. Your health care provider makes a hole in the amniotic sac using a small instrument. Once the amniotic sac breaks, contractions should begin. This may still take hours to see an effect.   Medicine to trigger or strengthen contractions. This medicine is given through an IV access tube inserted into a vein in your arm.  All of the methods of induction, besides stripping the membranes, will be done in the hospital. Induction is done in the hospital so that you and the baby can be carefully monitored.  HOW LONG DOES IT TAKE FOR LABOR TO BE INDUCED? Some inductions can take up to 2-3 days. Depending on the cervix, it usually takes less time. It takes longer when you are induced early in the pregnancy or if this is your first pregnancy. If a mother is still pregnant and the induction has been going on for 2-3 days, either the mother will be sent home or a cesarean delivery will be needed. WHAT ARE THE RISKS ASSOCIATED WITH LABOR INDUCTION? Some of the risks of induction include:  Changes in fetal heart rate, such as too high, too low, or erratic.   Fetal distress.   Chance of infection for the mother and baby.   Increased chance of having a cesarean delivery.   Breaking off (abruption) of the placenta from the uterus (rare).   Uterine rupture (very rare).  When induction is needed for medical reasons, the benefits of induction may outweigh the risks. WHAT ARE SOME REASONS FOR NOT INDUCING LABOR? Labor induction should not be done if:   It is shown that your baby does not tolerate labor.   You have had previous surgeries on your  uterus, such as a myomectomy or the removal of fibroids.   Your placenta lies very low in the uterus and blocks the opening of the cervix (placenta previa).   Your baby is not in a head-down position.   The umbilical cord drops down into the birth canal in front of the baby. This could cut off the baby's blood and oxygen supply.   You have had a previous cesarean delivery.   There are unusual circumstances, such as the baby being extremely premature.    This information is not intended to replace advice given to you by your health care provider. Make sure you discuss any questions you have with your health care provider.   Document Released: 05/03/2007 Document Revised: 01/02/2015 Document Reviewed: 07/11/2013 Elsevier Interactive Patient Education 2016 [employer]  

## 2016-10-27 ENCOUNTER — Ambulatory Visit (INDEPENDENT_AMBULATORY_CARE_PROVIDER_SITE_OTHER): Payer: Medicaid Other | Admitting: Obstetrics and Gynecology

## 2016-10-27 ENCOUNTER — Other Ambulatory Visit (INDEPENDENT_AMBULATORY_CARE_PROVIDER_SITE_OTHER): Payer: 59

## 2016-10-27 VITALS — BP 148/102 | HR 92 | Wt 225.5 lb

## 2016-10-27 DIAGNOSIS — O99210 Obesity complicating pregnancy, unspecified trimester: Secondary | ICD-10-CM

## 2016-10-27 DIAGNOSIS — O1403 Mild to moderate pre-eclampsia, third trimester: Secondary | ICD-10-CM

## 2016-10-27 LAB — POCT URINALYSIS DIPSTICK
Bilirubin, UA: NEGATIVE
Glucose, UA: NEGATIVE
KETONES UA: NEGATIVE
Nitrite, UA: NEGATIVE
PH UA: 6.5
Spec Grav, UA: 1.02
Urobilinogen, UA: NEGATIVE

## 2016-10-27 NOTE — Progress Notes (Signed)
NONSTRESS TEST INTERPRETATION  INDICATIONS: mild preeclampsia, obesity  FHR baseline: 150 RESULTS: reactive COMMENTS: B/P-161/84, P-84,  B/P-139/83, P-88,  B/P-155/80, P-85 Pt was upset when she first got here regarding some personal issues.    PLAN: 1. Continue fetal kick counts twice a day. 2. Continue antepartum testing as scheduled-Biweekly 3. IOL on Friday  Fenton Mallingebbie Rayvion Stumph, LPN

## 2016-10-28 ENCOUNTER — Inpatient Hospital Stay
Admission: EM | Admit: 2016-10-28 | Discharge: 2016-11-02 | DRG: 775 | Disposition: A | Discharge status: Home / Self Care | Payer: 59 | Attending: Obstetrics and Gynecology | Admitting: Obstetrics and Gynecology

## 2016-10-28 ENCOUNTER — Other Ambulatory Visit: Payer: 59

## 2016-10-28 DIAGNOSIS — A5901 Trichomonal vulvovaginitis: Secondary | ICD-10-CM

## 2016-10-28 DIAGNOSIS — B951 Streptococcus, group B, as the cause of diseases classified elsewhere: Secondary | ICD-10-CM | POA: Diagnosis present

## 2016-10-28 DIAGNOSIS — O26893 Other specified pregnancy related conditions, third trimester: Secondary | ICD-10-CM | POA: Diagnosis present

## 2016-10-28 DIAGNOSIS — O1494 Unspecified pre-eclampsia, complicating childbirth: Secondary | ICD-10-CM | POA: Diagnosis present

## 2016-10-28 DIAGNOSIS — Z833 Family history of diabetes mellitus: Secondary | ICD-10-CM | POA: Diagnosis not present

## 2016-10-28 DIAGNOSIS — O99824 Streptococcus B carrier state complicating childbirth: Secondary | ICD-10-CM | POA: Diagnosis present

## 2016-10-28 DIAGNOSIS — Z3403 Encounter for supervision of normal first pregnancy, third trimester: Secondary | ICD-10-CM | POA: Diagnosis not present

## 2016-10-28 DIAGNOSIS — Z6835 Body mass index (BMI) 35.0-35.9, adult: Secondary | ICD-10-CM | POA: Diagnosis not present

## 2016-10-28 DIAGNOSIS — E669 Obesity, unspecified: Secondary | ICD-10-CM | POA: Diagnosis present

## 2016-10-28 DIAGNOSIS — Z6791 Unspecified blood type, Rh negative: Secondary | ICD-10-CM | POA: Diagnosis not present

## 2016-10-28 DIAGNOSIS — Z3A38 38 weeks gestation of pregnancy: Secondary | ICD-10-CM | POA: Diagnosis not present

## 2016-10-28 DIAGNOSIS — O9989 Other specified diseases and conditions complicating pregnancy, childbirth and the puerperium: Secondary | ICD-10-CM

## 2016-10-28 DIAGNOSIS — O09899 Supervision of other high risk pregnancies, unspecified trimester: Secondary | ICD-10-CM

## 2016-10-28 DIAGNOSIS — O99214 Obesity complicating childbirth: Secondary | ICD-10-CM | POA: Diagnosis present

## 2016-10-28 DIAGNOSIS — O23593 Infection of other part of genital tract in pregnancy, third trimester: Secondary | ICD-10-CM

## 2016-10-28 DIAGNOSIS — Z8249 Family history of ischemic heart disease and other diseases of the circulatory system: Secondary | ICD-10-CM | POA: Diagnosis not present

## 2016-10-28 DIAGNOSIS — O1414 Severe pre-eclampsia complicating childbirth: Secondary | ICD-10-CM | POA: Diagnosis present

## 2016-10-28 DIAGNOSIS — Z283 Underimmunization status: Secondary | ICD-10-CM

## 2016-10-28 DIAGNOSIS — Z2839 Other underimmunization status: Secondary | ICD-10-CM

## 2016-10-28 DIAGNOSIS — O1490 Unspecified pre-eclampsia, unspecified trimester: Secondary | ICD-10-CM | POA: Diagnosis present

## 2016-10-28 LAB — CBC
HCT: 37.4 % (ref 35.0–47.0)
HEMOGLOBIN: 12.8 g/dL (ref 12.0–16.0)
MCH: 30.4 pg (ref 26.0–34.0)
MCHC: 34.2 g/dL (ref 32.0–36.0)
MCV: 89 fL (ref 80.0–100.0)
Platelets: 207 10*3/uL (ref 150–440)
RBC: 4.2 MIL/uL (ref 3.80–5.20)
RDW: 13.5 % (ref 11.5–14.5)
WBC: 11.2 10*3/uL — ABNORMAL HIGH (ref 3.6–11.0)

## 2016-10-28 LAB — COMPREHENSIVE METABOLIC PANEL
ALT: 8 U/L — ABNORMAL LOW (ref 14–54)
ANION GAP: 5 (ref 5–15)
AST: 34 U/L (ref 15–41)
Albumin: 2.5 g/dL — ABNORMAL LOW (ref 3.5–5.0)
Alkaline Phosphatase: 120 U/L (ref 38–126)
BUN: 14 mg/dL (ref 6–20)
CHLORIDE: 108 mmol/L (ref 101–111)
CO2: 22 mmol/L (ref 22–32)
Calcium: 8.3 mg/dL — ABNORMAL LOW (ref 8.9–10.3)
Creatinine, Ser: 0.77 mg/dL (ref 0.44–1.00)
Glucose, Bld: 98 mg/dL (ref 65–99)
POTASSIUM: 4.8 mmol/L (ref 3.5–5.1)
Sodium: 135 mmol/L (ref 135–145)
Total Bilirubin: 0.9 mg/dL (ref 0.3–1.2)
Total Protein: 5.6 g/dL — ABNORMAL LOW (ref 6.5–8.1)

## 2016-10-28 LAB — PROTEIN / CREATININE RATIO, URINE
CREATININE, URINE: 215 mg/dL
PROTEIN CREATININE RATIO: 2.82 mg/mg{creat} — AB (ref 0.00–0.15)
TOTAL PROTEIN, URINE: 607 mg/dL

## 2016-10-28 MED ORDER — SOD CITRATE-CITRIC ACID 500-334 MG/5ML PO SOLN: 30.0000 mL | ORAL | Status: DC | PRN

## 2016-10-28 MED ORDER — TERBUTALINE SULFATE 1 MG/ML IJ SOLN: 0.2500 mg | Freq: Once | INTRAMUSCULAR | Status: DC | PRN

## 2016-10-28 MED ORDER — ACETAMINOPHEN 325 MG PO TABS
650.0000 mg | ORAL_TABLET | ORAL | Status: DC | PRN
  Administered 2016-10-29 – 2016-10-30 (×4): 650 mg via ORAL
  Filled 2016-10-28 (×4): qty 2

## 2016-10-28 MED ORDER — LABETALOL HCL 5 MG/ML IV SOLN
20.0000 mg | INTRAVENOUS | Status: AC | PRN
  Administered 2016-10-28 – 2016-10-29 (×3): 20 mg via INTRAVENOUS
  Filled 2016-10-28: qty 16

## 2016-10-28 MED ORDER — AMMONIA AROMATIC IN INHA
RESPIRATORY_TRACT | Status: AC
  Filled 2016-10-28: qty 10

## 2016-10-28 MED ORDER — MISOPROSTOL 25 MCG QUARTER TABLET
25.0000 ug | ORAL_TABLET | ORAL | Status: DC | PRN
  Administered 2016-10-28 – 2016-10-29 (×3): 25 ug via VAGINAL
  Filled 2016-10-28 (×3): qty 1

## 2016-10-28 MED ORDER — OXYCODONE-ACETAMINOPHEN 5-325 MG PO TABS: 2.0000 | ORAL_TABLET | ORAL | Status: DC | PRN

## 2016-10-28 MED ORDER — OXYTOCIN BOLUS FROM INFUSION: 500.0000 mL | Freq: Once | INTRAVENOUS | Status: DC

## 2016-10-28 MED ORDER — LACTATED RINGERS IV SOLN
INTRAVENOUS | Status: DC
  Administered 2016-10-28 – 2016-10-30 (×4): via INTRAVENOUS

## 2016-10-28 MED ORDER — OXYTOCIN 40 UNITS IN LACTATED RINGERS INFUSION - SIMPLE MED
2.5000 [IU]/h | INTRAVENOUS | Status: DC
  Administered 2016-10-30: 2.5 [IU]/h via INTRAVENOUS

## 2016-10-28 MED ORDER — OXYTOCIN 10 UNIT/ML IJ SOLN
INTRAMUSCULAR | Status: AC
  Filled 2016-10-28: qty 2

## 2016-10-28 MED ORDER — OXYCODONE-ACETAMINOPHEN 5-325 MG PO TABS
1.0000 | ORAL_TABLET | ORAL | Status: DC | PRN
  Filled 2016-10-28: qty 1

## 2016-10-28 MED ORDER — MISOPROSTOL 200 MCG PO TABS
ORAL_TABLET | ORAL | Status: AC
  Administered 2016-10-29: 25 ug via VAGINAL
  Filled 2016-10-28: qty 4

## 2016-10-28 MED ORDER — OXYTOCIN 40 UNITS IN LACTATED RINGERS INFUSION - SIMPLE MED
1.0000 m[IU]/min | INTRAVENOUS | Status: DC
  Administered 2016-10-29 – 2016-10-30 (×2): 1 m[IU]/min via INTRAVENOUS
  Filled 2016-10-28: qty 1000

## 2016-10-28 MED ORDER — LIDOCAINE HCL (PF) 1 % IJ SOLN
INTRAMUSCULAR | Status: AC
  Filled 2016-10-28: qty 30

## 2016-10-28 MED ORDER — SODIUM CHLORIDE 0.9 % IV SOLN
2.0000 g | Freq: Once | INTRAVENOUS | Status: AC
  Administered 2016-10-29: 2 g via INTRAVENOUS
  Filled 2016-10-28 (×3): qty 2000

## 2016-10-28 MED ORDER — LACTATED RINGERS IV SOLN
500.0000 mL | INTRAVENOUS | Status: DC | PRN
  Administered 2016-10-29: 250 mL via INTRAVENOUS

## 2016-10-28 MED ORDER — LIDOCAINE HCL (PF) 1 % IJ SOLN: 30.0000 mL | INTRAMUSCULAR | Status: DC | PRN

## 2016-10-28 MED ORDER — ONDANSETRON HCL 4 MG/2ML IJ SOLN: 4.0000 mg | Freq: Four times a day (QID) | INTRAMUSCULAR | Status: DC | PRN

## 2016-10-28 MED ORDER — SODIUM CHLORIDE 0.9 % IV SOLN
1.0000 g | INTRAVENOUS | Status: DC
  Administered 2016-10-29 – 2016-10-30 (×5): 1 g via INTRAVENOUS
  Filled 2016-10-28 (×5): qty 1000

## 2016-10-28 MED ORDER — BUTORPHANOL TARTRATE 1 MG/ML IJ SOLN
1.0000 mg | INTRAMUSCULAR | Status: DC | PRN
  Administered 2016-10-29: 1 mg via INTRAVENOUS
  Filled 2016-10-28: qty 1

## 2016-10-28 MED ORDER — HYDRALAZINE HCL 20 MG/ML IJ SOLN
10.0000 mg | Freq: Once | INTRAMUSCULAR | Status: AC | PRN
  Administered 2016-10-30: 10 mg via INTRAVENOUS
  Filled 2016-10-28: qty 1

## 2016-10-28 NOTE — H&P (Signed)
Obstetric History and Physical  Rhonda Davies is a 25 y.o. G1P0000 with IUP at 434w6d presenting for IOL for pre-eclampsia without severe features. Patient states she has been having  occasional mild irregular contractions, none vaginal bleeding, intact membranes, with active fetal movement.    Prenatal Course Source of Care: [facility] with onset of care at 8 weeks  Pregnancy complications or risks: Patient Active Problem List   Diagnosis Date Noted  . Pre-eclampsia 10/28/2016  . Mild pre-eclampsia affecting first pregnancy 10/18/2016  . Positive GBS test 10/17/2016  . Trichomonal vaginitis during pregnancy in third trimester 10/17/2016  . GBS bacteriuria 10/11/2016  . Pregnancy 07/03/2016  . ASCUS with positive high risk HPV 05/29/2016  . Supervision of normal first pregnancy in third trimester 04/26/2016  . Obesity (BMI 35.0-39.9 without comorbidity) 04/26/2016  . Maternal varicella, non-immune 04/26/2016  . Rubella non-immune status, antepartum 04/26/2016  . H/O chlamydia infection 01/27/2016  . Dysfunctional uterine bleeding 06/05/2015   She plans to breastfeed She desires oral contraceptives (estrogen/progesterone) for postpartum contraception.   Prenatal labs and studies: ABO, Rh: --/--/A NEG (11/03 2056) Antibody: POS (11/03 2056) Rubella: <0.90 (10/17 1405) RPR: Non Reactive (04/03 1614)  HBsAg: Negative (04/03 1614)  HIV: Non Reactive (04/03 1614)  ZOX:WRUEAVWUGBS:Positive (10/17 1416) 1 hr Glucola normal (113) Genetic screening normal Anatomy US normal  Past Medical History:  Diagnosis Date  . Amenorrhea   . Kidney stone   . Left groin pain   . Personal history UTI     Past Surgical History:  Procedure Laterality Date  . CYST REMOVED  2013   IN NECK  . TONSILLECTOMY AND ADENOIDECTOMY  2007    OB History  Gravida Para Term Preterm AB Living  1 0 0 0 0 0  SAB TAB Ectopic Multiple Live Births  0 0 0 0      # Outcome Date GA Lbr Len/2nd Weight Sex  Delivery Anes PTL Lv  1 Current               Social History   Social History  . Marital status: Single    Spouse name: N/A  . Number of children: N/A  . Years of education: N/A   Occupational History  . day care worker    Social History Main Topics  . Smoking status: Never Smoker  . Smokeless tobacco: Never Used  . Alcohol use No  . Drug use: No  . Sexual activity: Yes    Partners: Male    Birth control/ protection: None   Other Topics Concern  . None   Social History Narrative   Single.    Currently pregnant.   Works as a Building surveyorDaycare Teacher.    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   Enjoys mudding, fishing.     Family History  Problem Relation Age of Onset  . Heart disease Father   . Diabetes Maternal Aunt   . Heart disease Maternal Grandfather   . Diabetes Maternal Grandfather   . Diabetes Paternal Grandmother   . Heart disease Paternal Grandfather   . Breast cancer Neg Hx   . Colon cancer Neg Hx   . Ovarian cancer Neg Hx     Prescriptions Prior to Admission  Medication Sig Dispense Refill Last Dose  . Prenat w/o A FeCbnFeGlu-FA &B6 (CITRANATAL B-CALM) 20-1 & 25 (2) MG MISC Take 20 mg by mouth daily. 30 each 11 10/28/2016 at Unknown time  . metroNIDAZOLE (FLAGYL) 500 MG tablet Take  1 tablet (500 mg total) by mouth 2 (two) times daily. (Patient not taking: Reported on 10/28/2016) 14 tablet 0 Not Taking at Unknown time    Allergies  Allergen Reactions  . Benzonatate Swelling    Review of Systems: Negative except for what is mentioned in HPI.  Physical Exam: BP (!) 169/101   Pulse 84   LMP 01/30/2016 (Exact Date)  CONSTITUTIONAL: Well-developed, well-nourished female in no acute distress.  HENT:  Normocephalic, atraumatic, External right and left ear normal. Oropharynx is clear and moist EYES: Conjunctivae and EOM are normal. Pupils are equal, round, and reactive to light. No scleral icterus.  NECK: Normal range of motion, supple, no masses SKIN: Skin is  warm and dry. No rash noted. Not diaphoretic. No erythema. No pallor. NEUROLOGIC: Alert and oriented to person, place, and time. Normal reflexes, muscle tone coordination. No cranial nerve deficit noted. PSYCHIATRIC: Normal mood and affect. Normal behavior. Normal judgment and thought content. CARDIOVASCULAR: Normal heart rate noted, regular rhythm RESPIRATORY: Effort and breath sounds normal, no problems with respiration noted ABDOMEN: Soft, nontender, nondistended, gravid. MUSCULOSKELETAL: Normal range of motion. No edema and no tenderness. 2+ distal pulses.  Cervical Exam: Dilatation 0.5 cm   Effacement thick   Station -3   Presentation: cephalic FHT:  Baseline rate 135 bpm   Variability moderate  Accelerations present   Decelerations none Contractions: Infrequent   Pertinent Labs/Studies:   Results for orders placed or performed during the hospital encounter of 10/28/16 (from the past 24 hour(s))  CBC     Status: Abnormal   Collection Time: 10/28/16  8:56 PM  Result Value Ref Range   WBC 11.2 (H) 3.6 - 11.0 K/uL   RBC 4.20 3.80 - 5.20 MIL/uL   Hemoglobin 12.8 12.0 - 16.0 g/dL   HCT 16.137.4 09.635.0 - 04.547.0 %   MCV 89.0 80.0 - 100.0 fL   MCH 30.4 26.0 - 34.0 pg   MCHC 34.2 32.0 - 36.0 g/dL   RDW 40.913.5 81.111.5 - 91.414.5 %   Platelets 207 150 - 440 K/uL  Type and screen     Status: None (Preliminary result)   Collection Time: 10/28/16  8:56 PM  Result Value Ref Range   ABO/RH(D) A NEG    Antibody Screen POS    Sample Expiration 10/31/2016    Antibody Identification PASSIVELY ACQUIRED ANTI-D    Unit Number [id number]    Blood Component Type RED CELLS,LR    Unit division 00    Status of Unit ALLOCATED    Transfusion Status OK TO TRANSFUSE    Crossmatch Result COMPATIBLE    Unit Number [id number]    Blood Component Type RED CELLS,LR    Unit division 00    Status of Unit ALLOCATED    Transfusion Status OK TO TRANSFUSE    Crossmatch Result COMPATIBLE   Comprehensive metabolic panel      Status: Abnormal   Collection Time: 10/28/16  9:44 PM  Result Value Ref Range   Sodium 135 135 - 145 mmol/L   Potassium 4.8 3.5 - 5.1 mmol/L   Chloride 108 101 - 111 mmol/L   CO2 22 22 - 32 mmol/L   Glucose, Bld 98 65 - 99 mg/dL   BUN 14 6 - 20 mg/dL   Creatinine, Ser 3.240.77 0.44 - 1.00 mg/dL   Calcium 8.3 (L) 8.9 - 10.3 mg/dL   Total Protein 5.6 (L) 6.5 - 8.1 g/dL   Albumin 2.5 (L) 3.5 - 5.0 g/dL   AST  34 15 - 41 U/L   ALT 8 (L) 14 - 54 U/L   Alkaline Phosphatase 120 38 - 126 U/L   Total Bilirubin 0.9 0.3 - 1.2 mg/dL   GFR calc non Af Amer >60 >60 mL/min   GFR calc Af Amer >60 >60 mL/min   Anion gap 5 5 - 15  Protein / creatinine ratio, urine     Status: Abnormal   Collection Time: 10/28/16  9:50 PM  Result Value Ref Range   Creatinine, Urine 215 mg/dL   Total Protein, Urine 607 mg/dL   Protein Creatinine Ratio 2.82 (H) 0.00 - 0.15 mg/mg[Cre]    Assessment : Rhonda Davies is a 25 y.o. G1P0000 at [redacted]w[redacted]d being admitted for IOL for pre-eclampsia without severe features. Rh negative status.  Plan: Labor: Induction with Cytotec, per protocol.  Will order new set of PIH labs.  Patient with most recent BP in severe range, if this continues will need to initiate Mag Sulfate for development of pre-eclampsia with severe features.  FWB: Reassuring fetal heart tracing.  GBS positive.  Will treat with Ampicillin once in early labor Delivery plan: Hopeful for vaginal delivery   Hildred Laser, MD [facility]

## 2016-10-28 NOTE — Progress Notes (Signed)
NST performed today was reviewed and was found to be reactive.  1 small spontaneous deceleration noted at beginning of tracing and no accelerations present. Variability was minimal to moderate, but all improved after approximately 10 minutes.  Remainder of tracing reassuring.  BPP performed to assess fetal status, 8/8. Patient scheduled for IOL on 10/28/2016.   Hildred LaserAnika Jenaya Saar, MD [facility]

## 2016-10-28 NOTE — Progress Notes (Signed)
Valentino Saxonherry, MD notified of pt arrival for IOL, SVE, & current BP's, will follow new orders.

## 2016-10-29 ENCOUNTER — Inpatient Hospital Stay: Payer: 59 | Admitting: Anesthesiology

## 2016-10-29 LAB — CBC
HEMATOCRIT: 38.9 % (ref 35.0–47.0)
HEMOGLOBIN: 13.8 g/dL (ref 12.0–16.0)
MCH: 31.4 pg (ref 26.0–34.0)
MCHC: 35.4 g/dL (ref 32.0–36.0)
MCV: 88.6 fL (ref 80.0–100.0)
Platelets: 191 10*3/uL (ref 150–440)
RBC: 4.39 MIL/uL (ref 3.80–5.20)
RDW: 13.9 % (ref 11.5–14.5)
WBC: 13.7 10*3/uL — ABNORMAL HIGH (ref 3.6–11.0)

## 2016-10-29 MED ORDER — LIDOCAINE-EPINEPHRINE (PF) 1.5 %-1:200000 IJ SOLN
INTRAMUSCULAR | Status: DC | PRN
  Administered 2016-10-29: 2 mL via PERINEURAL
  Administered 2016-10-29: 3 mL via PERINEURAL

## 2016-10-29 MED ORDER — BUPIVACAINE HCL (PF) 0.25 % IJ SOLN
INTRAMUSCULAR | Status: DC | PRN
  Administered 2016-10-29 (×2): 5 mL via EPIDURAL
  Administered 2016-10-30: 7 mL via EPIDURAL

## 2016-10-29 MED ORDER — LIDOCAINE HCL (PF) 1 % IJ SOLN
INTRAMUSCULAR | Status: AC
  Filled 2016-10-29: qty 30

## 2016-10-29 MED ORDER — MISOPROSTOL 200 MCG PO TABS
ORAL_TABLET | ORAL | Status: AC
  Filled 2016-10-29: qty 4

## 2016-10-29 MED ORDER — FENTANYL 2.5 MCG/ML W/ROPIVACAINE 0.2% IN NS 100 ML EPIDURAL INFUSION (ARMC-ANES)
EPIDURAL | Status: DC | PRN
  Administered 2016-10-29: 10 mL/h via EPIDURAL
  Administered 2016-10-30: 05:00:00 via EPIDURAL

## 2016-10-29 MED ORDER — OXYTOCIN 10 UNIT/ML IJ SOLN
INTRAMUSCULAR | Status: AC
  Filled 2016-10-29: qty 2

## 2016-10-29 MED ORDER — MAGNESIUM SULFATE 50 % IJ SOLN
2.0000 g/h | INTRAVENOUS | Status: DC
  Administered 2016-10-29 – 2016-10-30 (×2): 2 g/h via INTRAVENOUS
  Filled 2016-10-29 (×2): qty 80

## 2016-10-29 MED ORDER — SODIUM CHLORIDE FLUSH 0.9 % IV SOLN
INTRAVENOUS | Status: AC
  Filled 2016-10-29: qty 10

## 2016-10-29 MED ORDER — FENTANYL 2.5 MCG/ML W/ROPIVACAINE 0.2% IN NS 100 ML EPIDURAL INFUSION (ARMC-ANES)
EPIDURAL | Status: AC
  Filled 2016-10-29: qty 100

## 2016-10-29 MED ORDER — AMMONIA AROMATIC IN INHA
RESPIRATORY_TRACT | Status: AC
  Filled 2016-10-29: qty 10

## 2016-10-29 MED ORDER — MAGNESIUM SULFATE 50 % IJ SOLN
INTRAMUSCULAR | Status: AC
  Administered 2016-10-29: 2 g/h via INTRAVENOUS
  Filled 2016-10-29: qty 80

## 2016-10-29 MED ORDER — BUTORPHANOL TARTRATE 1 MG/ML IJ SOLN
2.0000 mg | INTRAMUSCULAR | Status: DC | PRN
  Administered 2016-10-29: 2 mg via INTRAVENOUS
  Filled 2016-10-29: qty 2

## 2016-10-29 MED ORDER — LABETALOL HCL 5 MG/ML IV SOLN
20.0000 mg | INTRAVENOUS | Status: DC | PRN
  Administered 2016-10-29 – 2016-10-30 (×2): 40 mg via INTRAVENOUS
  Administered 2016-10-30: 20 mg via INTRAVENOUS
  Filled 2016-10-29 (×3): qty 16

## 2016-10-29 MED ORDER — OXYCODONE-ACETAMINOPHEN 5-325 MG PO TABS
1.0000 | ORAL_TABLET | Freq: Once | ORAL | Status: AC
  Administered 2016-10-29: 1 via ORAL

## 2016-10-29 MED ORDER — MAGNESIUM SULFATE BOLUS VIA INFUSION
4.0000 g | Freq: Once | INTRAVENOUS | Status: AC
  Administered 2016-10-29: 4 g via INTRAVENOUS
  Filled 2016-10-29: qty 500

## 2016-10-29 NOTE — Progress Notes (Signed)
Intrapartum Progress Note  S: Patient feeling more intense contractions. Foley bulb out.  O:  Vitals:   10/29/16 1419 10/29/16 1432 10/29/16 1519 10/29/16 1530  BP: 136/73  (!) 149/93   Pulse: 69  64   Resp:  20  20  Temp:  98.3 F (36.8 C)    TempSrc:  Oral     Gen App: NAD, comfortable Abdomen: soft, gravid FHT: baseline 130 bpm.  Accels present.  Decels absent. moderate in degree variability.   Tocometer: contractions q 1-2 minutes Cervix: 3.5/50-60/-2/c/intact Extremities: Nontender, no edema.  Pitocin: None  Labs: No new labs   Assessment:  1: SIUP at 4055w0d 2. Pre-eclampsia without severe features 3. GBS + 4. Obesity  Plan:  1. Continue IOL. Continue Pitocin for augmentation. AROM'd with clear fluid.  IUPC placed.  2. Continue Mag Sulfate. Labetalol for severe range BPs.  3. Continue ampicillin for GBS+ status   Hildred LaserAnika Donta Fuster, MD 10/29/2016 3:57 PM

## 2016-10-29 NOTE — Progress Notes (Addendum)
Intrapartum Progress Note  S: Patient notes feeling mild contractions. Otherwise denies complaints.   O: Blood pressure 139/63, pulse 62, temperature 98 F (36.7 C), temperature source Oral, resp. rate 20, last menstrual period 01/30/2016. Gen App: NAD, comfortable Abdomen: soft, gravid FHT: baseline 130 bpm.  Accels present.  Decels absent. moderate in degree variability.   Tocometer: contractions q 1-2 minutes Cervix: 1.5/30/-3/c/intact Extremities: Nontender, no edema.  Pitocin: None  Labs: No new labs   Assessment:  1: SIUP at 11107w0d 2. Pre-eclampsia without severe features 3. GBS+ 4. Obesity  Plan:  1. Continue IOL.  Foley bulb placed, will start low dose Pitocin.  2. Continue to monitor BPs.  If evidence of severe pre-eclampsia, will initiate Mag Sulfate. Labetalol for severe range BPs.  3. Continue with ampicillin for GBS prophylaxis.  Hildred LaserAnika Hasini Peachey, MD 10/29/2016 11:43 AM

## 2016-10-29 NOTE — Anesthesia Preprocedure Evaluation (Signed)
Anesthesia Evaluation  Patient identified by MRN, date of birth, ID band Patient awake    Reviewed: Allergy & Precautions, NPO status   Airway Mallampati: II  TM Distance: >3 FB Neck ROM: Full    Dental no notable dental hx.    Pulmonary           Cardiovascular hypertension (gestational),      Neuro/Psych    GI/Hepatic   Endo/Other    Renal/GU Renal disease (kidney stones)     Musculoskeletal   Abdominal   Peds  Hematology   Anesthesia Other Findings   Reproductive/Obstetrics (+) Pregnancy                             Anesthesia Physical Anesthesia Plan  ASA: II  Anesthesia Plan: Epidural   Post-op Pain Management:    Induction:   Airway Management Planned:   Additional Equipment:   Intra-op Plan:   Post-operative Plan:   Informed Consent: I have reviewed the patients History and Physical, chart, labs and discussed the procedure including the risks, benefits and alternatives for the proposed anesthesia with the patient or authorized representative who has indicated his/her understanding and acceptance.   Dental advisory given  Plan Discussed with: Anesthesiologist  Anesthesia Plan Comments:         Anesthesia Quick Evaluation

## 2016-10-29 NOTE — Anesthesia Procedure Notes (Signed)
Epidural  Start time: 10/29/2016 7:05 PM End time: 10/29/2016 7:30 PM  Staffing Resident/CRNA: Clovis FredricksonRISSON, Delonte Musich Performed: resident/CRNA   Preanesthetic Checklist Completed: patient identified, site marked, surgical consent, pre-op evaluation, timeout performed, IV checked, risks and benefits discussed and monitors and equipment checked  Epidural Patient position: sitting Prep: Betadine Patient monitoring: heart rate, continuous pulse ox and blood pressure Approach: midline Location: L3-L4 Injection technique: LOR air  Needle:  Needle type: Tuohy  Needle gauge: 18 G Needle length: 9 cm Needle insertion depth: 7 cm Catheter type: closed end flexible Catheter size: 19 Gauge Catheter at skin depth: 12 cm Test dose: negative and 1.5% lidocaine with Epi 1:200 K  Assessment Sensory level: T10 Events: blood not aspirated, injection not painful, no injection resistance, negative IV test and no paresthesia  Additional Notes negative CSF, negative parathesia, patient tolerated procedure well.Reason for block:procedure for pain

## 2016-10-29 NOTE — Progress Notes (Signed)
Intrapartum Progress Note  S: Patient is s/p epidural.    O:  Vitals:   10/29/16 1716 10/29/16 1745 10/29/16 1801 10/29/16 1817  BP: (!) 154/87 (!) 144/81  (!) 154/84  Pulse: 77 86  75  Resp:   20   Temp:      TempSrc:       Gen App: NAD, comfortable Abdomen: soft, gravid FHT: baseline 120 bpm.  Accels present.  Decels present - late decelerations from baseline to 90s. moderate in degree variability.  Category II tracing.  Tocometer: contractions q 1-2 minutes Cervix: 3.5/50-60/-2/c/intact Extremities: Nontender, no edema.  Pitocin: None  Labs: No new labs   Assessment:  1: SIUP at 4927w0d 2. Pre-eclampsia with severe features (severe range BPs) 3. GBS + 4. Obesity  Plan:  1. Continue IOL. Pitocin held for late decelerations. Facemask O2 given. Can resume once more reassuring tracing. New IUPC placed, and FSE placed.  2. Continue Mag Sulfate. Labetalol for severe range BPs.  3. Continue ampicillin for GBS+ status   Hildred LaserAnika Mikaelah Trostle, MD 10/29/2016 8:54 PM

## 2016-10-30 DIAGNOSIS — Z3403 Encounter for supervision of normal first pregnancy, third trimester: Secondary | ICD-10-CM

## 2016-10-30 LAB — PLATELET COUNT: Platelets: 175 10*3/uL (ref 150–440)

## 2016-10-30 MED ORDER — PHENYLEPHRINE 40 MCG/ML (10ML) SYRINGE FOR IV PUSH (FOR BLOOD PRESSURE SUPPORT): 80.0000 ug | PREFILLED_SYRINGE | INTRAVENOUS | Status: DC | PRN

## 2016-10-30 MED ORDER — ONDANSETRON HCL 4 MG/2ML IJ SOLN: 4.0000 mg | INTRAMUSCULAR | Status: DC | PRN

## 2016-10-30 MED ORDER — LACTATED RINGERS IV SOLN: 500.0000 mL | Freq: Once | INTRAVENOUS | Status: DC

## 2016-10-30 MED ORDER — EPHEDRINE 5 MG/ML INJ: 10.0000 mg | INTRAVENOUS | Status: DC | PRN

## 2016-10-30 MED ORDER — SIMETHICONE 80 MG PO CHEW: 80.0000 mg | CHEWABLE_TABLET | ORAL | Status: DC | PRN

## 2016-10-30 MED ORDER — OXYCODONE HCL 5 MG PO TABS
10.0000 mg | ORAL_TABLET | ORAL | Status: DC | PRN
  Administered 2016-10-30 – 2016-11-01 (×10): 10 mg via ORAL
  Administered 2016-11-02: 5 mg via ORAL
  Administered 2016-11-02: 10 mg via ORAL
  Filled 2016-10-30 (×13): qty 2

## 2016-10-30 MED ORDER — PRENATAL MULTIVITAMIN CH
1.0000 | ORAL_TABLET | Freq: Every day | ORAL | Status: DC
  Administered 2016-10-31 – 2016-11-02 (×3): 1 via ORAL
  Filled 2016-10-30 (×4): qty 1

## 2016-10-30 MED ORDER — ACETAMINOPHEN 325 MG PO TABS
650.0000 mg | ORAL_TABLET | Freq: Four times a day (QID) | ORAL | Status: DC
  Administered 2016-10-30 – 2016-11-02 (×11): 650 mg via ORAL
  Filled 2016-10-30 (×11): qty 2

## 2016-10-30 MED ORDER — ZOLPIDEM TARTRATE 5 MG PO TABS: 5.0000 mg | ORAL_TABLET | Freq: Every evening | ORAL | Status: DC | PRN

## 2016-10-30 MED ORDER — WITCH HAZEL-GLYCERIN EX PADS: 1.0000 "application " | MEDICATED_PAD | CUTANEOUS | Status: DC | PRN

## 2016-10-30 MED ORDER — DIPHENHYDRAMINE HCL 50 MG/ML IJ SOLN: 12.5000 mg | INTRAMUSCULAR | Status: DC | PRN

## 2016-10-30 MED ORDER — COCONUT OIL OIL
1.0000 "application " | TOPICAL_OIL | Status: DC | PRN
  Administered 2016-10-31: 1 via TOPICAL
  Filled 2016-10-30 (×2): qty 120

## 2016-10-30 MED ORDER — LACTATED RINGERS IV SOLN
INTRAVENOUS | Status: DC
  Administered 2016-10-30 – 2016-10-31 (×2): via INTRAVENOUS
  Administered 2016-10-31: 100 mL/h via INTRAVENOUS

## 2016-10-30 MED ORDER — NIFEDIPINE ER OSMOTIC RELEASE 30 MG PO TB24
30.0000 mg | ORAL_TABLET | Freq: Every day | ORAL | Status: DC
  Administered 2016-10-30 – 2016-10-31 (×2): 30 mg via ORAL
  Filled 2016-10-30 (×2): qty 1

## 2016-10-30 MED ORDER — MAGNESIUM SULFATE 50 % IJ SOLN
2.0000 g/h | INTRAMUSCULAR | Status: AC
  Administered 2016-10-31: 2 g/h via INTRAVENOUS
  Filled 2016-10-30: qty 80

## 2016-10-30 MED ORDER — DOCUSATE SODIUM 100 MG PO CAPS
100.0000 mg | ORAL_CAPSULE | Freq: Two times a day (BID) | ORAL | Status: DC
  Administered 2016-10-30 – 2016-11-02 (×5): 100 mg via ORAL
  Filled 2016-10-30 (×6): qty 1

## 2016-10-30 MED ORDER — FENTANYL 2.5 MCG/ML W/ROPIVACAINE 0.2% IN NS 100 ML EPIDURAL INFUSION (ARMC-ANES)
9.0000 mL/h | EPIDURAL | Status: DC
  Filled 2016-10-30: qty 100

## 2016-10-30 MED ORDER — ONDANSETRON HCL 4 MG PO TABS: 4.0000 mg | ORAL_TABLET | ORAL | Status: DC | PRN

## 2016-10-30 MED ORDER — OXYCODONE HCL 5 MG PO TABS
5.0000 mg | ORAL_TABLET | ORAL | Status: DC | PRN
  Administered 2016-11-02 (×2): 5 mg via ORAL
  Filled 2016-10-30: qty 1

## 2016-10-30 MED ORDER — DIBUCAINE 1 % RE OINT: 1.0000 "application " | TOPICAL_OINTMENT | RECTAL | Status: DC | PRN

## 2016-10-31 LAB — TYPE AND SCREEN
ABO/RH(D): A NEG
Antibody Screen: POSITIVE
UNIT DIVISION: 0
UNIT DIVISION: 0

## 2016-10-31 LAB — RPR: RPR Ser Ql: NONREACTIVE

## 2016-10-31 LAB — CBC
HEMATOCRIT: 37.3 % (ref 35.0–47.0)
Hemoglobin: 12.7 g/dL (ref 12.0–16.0)
MCH: 30.8 pg (ref 26.0–34.0)
MCHC: 34 g/dL (ref 32.0–36.0)
MCV: 90.6 fL (ref 80.0–100.0)
PLATELETS: 138 10*3/uL — AB (ref 150–440)
RBC: 4.11 MIL/uL (ref 3.80–5.20)
RDW: 13.9 % (ref 11.5–14.5)
WBC: 13.3 10*3/uL — AB (ref 3.6–11.0)

## 2016-10-31 LAB — FETAL SCREEN: Fetal Screen: NEGATIVE

## 2016-10-31 MED ORDER — RHO D IMMUNE GLOBULIN 1500 UNIT/2ML IJ SOSY
300.0000 ug | PREFILLED_SYRINGE | Freq: Once | INTRAMUSCULAR | Status: AC
  Administered 2016-10-31: 300 ug via INTRAVENOUS
  Filled 2016-10-31: qty 2

## 2016-10-31 MED ORDER — DIPHENHYDRAMINE HCL 25 MG PO CAPS: 25.0000 mg | ORAL_CAPSULE | Freq: Four times a day (QID) | ORAL | Status: DC | PRN

## 2016-10-31 MED ORDER — IBUPROFEN 600 MG PO TABS
600.0000 mg | ORAL_TABLET | Freq: Once | ORAL | Status: AC
  Administered 2016-10-31: 600 mg via ORAL
  Filled 2016-10-31: qty 1

## 2016-10-31 MED ORDER — MORPHINE SULFATE (PF) 2 MG/ML IV SOLN
2.0000 mg | INTRAVENOUS | Status: DC | PRN
  Administered 2016-10-31 (×2): 2 mg via INTRAVENOUS
  Filled 2016-10-31 (×2): qty 1

## 2016-10-31 MED ORDER — BENZOCAINE-MENTHOL 20-0.5 % EX AERO: 1.0000 "application " | INHALATION_SPRAY | CUTANEOUS | Status: DC | PRN

## 2016-10-31 MED ORDER — IBUPROFEN 600 MG PO TABS
ORAL_TABLET | ORAL | Status: AC
  Filled 2016-10-31: qty 1

## 2016-10-31 NOTE — Progress Notes (Signed)
Post Partum Day # 1, s/p SVD, with severe pre-eclampsia on Magnesium Sulfate  Subjective: tolerating PO.  Has foley catheter in place to monitor output so has not yet voided on her own.  Complains of headache not relieved by Tylenol, Oxicodone, or Ibuprofen. Not yet ambulatory.  Objective: Temp:  [98.2 F (36.8 C)-98.7 F (37.1 C)] 98.2 F (36.8 C) (11/06 0334) Pulse Rate:  [86-112] 86 (11/06 0549) Resp:  [16-20] 18 (11/06 0549) BP: (124-179)/(72-114) 124/72 (11/06 0549)  Physical Exam:  General: alert and no distress  Lungs: clear to auscultation bilaterally Breasts: normal appearance, no masses or tenderness Heart: regular rate and rhythm, S1, S2 normal, no murmur, click, rub or gallop Pelvis: Lochia: appropriate, Uterine Fundus: firm Extremities: DVT Evaluation: No evidence of DVT seen on physical exam.  Negative Homan's sign. No cords or calf tenderness. +2 pitting ankle edema.  Neuro: + 2 reflexes, no clonus   CBC Latest Ref Rng & Units 10/31/2016 10/30/2016 10/29/2016  WBC 3.6 - 11.0 K/uL 13.3(H) - 13.7(H)  Hemoglobin 12.0 - 16.0 g/dL 16.112.7 - 09.613.8  Hematocrit 35.0 - 47.0 % 37.3 - 38.9  Platelets 150 - 440 K/uL 138(L) 175 191    Assessment/Plan: Plan for discharge tomorrow, Breastfeeding and Contraception OCPs Continue Magnesium Sulfate for 24 hrs postpartum (until 0800 this morning). Labetalol prn for BPs per protocol.  Received a dose of Procardia XL yesterday to help control BPs. Will give 2 mg IV morphine for headache.    LOS: 3 days   Hildred LaserAnika Bennett Vanscyoc [facility]

## 2016-10-31 NOTE — Anesthesia Postprocedure Evaluation (Signed)
Anesthesia Post Note  Patient: Rhonda Davies  Procedure(s) Performed: * No procedures listed *  Patient location during evaluation: L&D Anesthesia Type: Epidural Level of consciousness: awake and alert and oriented Pain management: pain level controlled (pt on mag gtt. headache from it) Vital Signs Assessment: post-procedure vital signs reviewed and stable Respiratory status: spontaneous breathing Cardiovascular status: stable Postop Assessment: patient able to bend at knees and no signs of nausea or vomiting (pt headache from mag gtt) Anesthetic complications: no    Last Vitals:  Vitals:   10/31/16 0549 10/31/16 0645  BP: 124/72 127/68  Pulse: 86 85  Resp: 18 18  Temp:      Last Pain:  Vitals:   10/31/16 0645  TempSrc:   PainSc: 10-Worst pain ever                 Zachary GeorgeWeatherly,  [place] Valliant F

## 2016-11-01 ENCOUNTER — Other Ambulatory Visit: Payer: 59

## 2016-11-01 ENCOUNTER — Encounter: Payer: 59 | Admitting: Obstetrics and Gynecology

## 2016-11-01 LAB — RHOGAM INJECTION: Unit division: 0

## 2016-11-01 LAB — SURGICAL PATHOLOGY

## 2016-11-01 MED ORDER — MORPHINE SULFATE (PF) 4 MG/ML IV SOLN: 4.0000 mg | INTRAVENOUS | Status: DC | PRN

## 2016-11-01 MED ORDER — LABETALOL HCL 200 MG PO TABS
200.0000 mg | ORAL_TABLET | Freq: Two times a day (BID) | ORAL | Status: DC
  Administered 2016-11-01 – 2016-11-02 (×3): 200 mg via ORAL
  Filled 2016-11-01 (×3): qty 1

## 2016-11-01 NOTE — Lactation Note (Signed)
This note was copied from a baby's chart. Lactation Consultation Note  Patient Name: Rhonda Davies Reason for consult: Follow-up assessment;Difficult latch;Infant < 6lbs;Other (Comment) (bili >13)  FT; 4lb 14.7oz at 7% baby with bili 13 day 2 of life; P1 Mom with large nipples. Mom showed me how she had been nursing Rhonda StreetRemy last night. She stopped using nipple shield. She used football hold with bili blanket under baby.  I saw that baby was only nursing on the outer edge of nipple, completely missing a correct latch. And creating a pinched nipple. Mom states that is how she fed last night. I counseled her that baby cannot get milk out that way. Unable to get correct latch until I applied 24 mm nipple shield. She developed and strong suck which soon became rhythmic with lots of swallows. Colostrum seen in shield chamber. After 15 minutes, she fell asleep and I returned her to bili lights with her blanket under her and eyes still covered. I then had mom pump with DEBP. She obtained 5 ml colostrum. Rhonda Davies fussed with a heavy void. I changed her then gave her the 5 ml with finger feeding and curved tip syringe. She then went to sleep again. Mom and RN state that Rhonda MartinezRemy spits up "a lot" so there is concern about giving formula if it is not medically needed which may likely increase spit up. My PLAN: to do pre post weight check at next feeding to determine her intake; continue pumping on other breast and feeding the extra colostrum: If there is still concern about her medically needing more volume after that, we will look into formula feeds. Meanwhile, we are encouraging Mom to nap between feeding sessions.   Maternal Data    Feeding Feeding Type: Breast Milk Length of feed: 15 min  LATCH Score/Interventions Latch: Repeated attempts needed to sustain latch, nipple held in mouth throughout feeding, stimulation needed to elicit sucking reflex. Intervention(s): Skin to skin;Teach  feeding cues Intervention(s): Assist with latch;Breast compression (NS)  Audible Swallowing: A few with stimulation Intervention(s): Hand expression (lots of colostrum in shield chamber)  Type of Nipple: Everted at rest and after stimulation (large)  Comfort (Breast/Nipple): Filling, red/small blisters or bruises, mild/mod discomfort  Problem noted: Mild/Moderate discomfort Interventions (Mild/moderate discomfort): Comfort gels;Hand expression  Hold (Positioning): Assistance needed to correctly position infant at breast and maintain latch.  LATCH Score: 6  Lactation Tools Discussed/Used Pump Review: Setup, frequency, and cleaning Initiated by:: Rhonda Davies Date initiated:: 11/02/16   Consult Status Consult Status: Follow-up Date: 11/02/16 Follow-up type: In-patient    Rhonda Davies, 11:34 AM

## 2016-11-01 NOTE — Progress Notes (Addendum)
Post Partum Day # 2, s/p SVD, with severe pre-eclampsia s/p Magnesium Sulfate  Subjective: no complaints, up ad lib, voiding and tolerating PO.   Objective: Temp:  [97.9 F (36.6 C)-99.1 F (37.3 C)] 98.5 F (36.9 C) (11/07 0357) Pulse Rate:  [77-113] 104 (11/07 0357) Resp:  [18-20] 18 (11/07 0357) BP: (132-148)/(74-93) 132/74 (11/07 0357) SpO2:  [97 %-98 %] 98 % (11/07 0357)  Physical Exam:  General: alert and no distress  Lungs: clear to auscultation bilaterally Breasts: normal appearance, no masses or tenderness Heart: regular rate and rhythm, S1, S2 normal, no murmur, click, rub or gallop Pelvis: Lochia: appropriate, Uterine Fundus: firm Extremities: DVT Evaluation: No evidence of DVT seen on physical exam.  Negative Homan's sign. No cords or calf tenderness. +1 pitting ankle edema.     CBC Latest Ref Rng & Units 10/31/2016 10/30/2016 10/29/2016  WBC 3.6 - 11.0 K/uL 13.3(H) - 13.7(H)  Hemoglobin 12.0 - 16.0 g/dL 16.112.7 - 09.613.8  Hematocrit 35.0 - 47.0 % 37.3 - 38.9  Platelets 150 - 440 K/uL 138(L) 175 191    Assessment/Plan: Plan for discharge tomorrow, Breastfeeding and Contraception OCPs S/p Magnesium Sulfate for 24 hrs postpartum  Discontinued Procardia secondary to severe headache side effects.  Will prescribe Labetalol 200 mg BID.  If headache continues to persist despite current interventions, may need to consider head CT.  Infant currently under bilirubin lights.  May be able to d/c home tomorrow.  If BPs controlled, can d/c home in a.m.     LOS: 4 days   Hildred LaserAnika Noga Fogg [facility]

## 2016-11-01 NOTE — Lactation Note (Signed)
This note was copied from a baby's chart. Lactation Consultation Note  Patient Name: Rhonda Davies [id number]'s Date: 11/01/2016 Reason for consult: Follow-up assessment   Maternal Data    Pre/post weight check indicated 2 ml intake. (This feeding was not as vigorous last feeding) New PLAN:  Mom to pump 15 minutes every 2-3 hours and feed baby a minimum of 15 ml colostrum/formula ...adjusting volume as needed. We can re-assess plan/ breastfeeding tomorrow.   Feeding Feeding Type: Breast Milk  LATCH Score/Interventions Latch: Grasps breast easily, tongue down, lips flanged, rhythmical sucking. (with nipple shield only)  Audible Swallowing: A few with stimulation Intervention(s): Hand expression  Type of Nipple: Everted at rest and after stimulation  Comfort (Breast/Nipple): Filling, red/small blisters or bruises, mild/mod discomfort  Problem noted: Mild/Moderate discomfort Interventions (Mild/moderate discomfort): Comfort gels  Hold (Positioning): No assistance needed to correctly position infant at breast.  LATCH Score: 8  Lactation Tools Discussed/Used Pump Review: Setup, frequency, and cleaning Initiated by:: Dannielle Baskins Date initiated:: 11/02/16   Consult Status      Rhonda Davies 11/01/2016, 2:51 PM

## 2016-11-02 ENCOUNTER — Encounter: Payer: Self-pay | Admitting: Certified Registered Nurse Anesthetist

## 2016-11-02 MED ORDER — DOCUSATE SODIUM 100 MG PO CAPS: 100.0000 mg | ORAL_CAPSULE | Freq: Two times a day (BID) | ORAL | 2 refills | Status: DC | PRN

## 2016-11-02 MED ORDER — LABETALOL HCL 200 MG PO TABS: 200.0000 mg | ORAL_TABLET | Freq: Two times a day (BID) | ORAL | 1 refills | Status: DC

## 2016-11-02 MED ORDER — IBUPROFEN 800 MG PO TABS: 800.0000 mg | ORAL_TABLET | Freq: Three times a day (TID) | ORAL | 1 refills | Status: DC | PRN

## 2016-11-02 NOTE — Discharge Summary (Signed)
Obstetric Discharge Summary Reason for Admission: induction of labor and severe pre-eclampsia Prenatal Procedures: NST and ultrasound Intrapartum Procedures: spontaneous vaginal delivery, GBS prophylaxis and Magnesium Sulfate Postpartum Procedures: none Complications-Operative and Postpartum: none Hemoglobin  Date Value Ref Range Status  10/31/2016 12.7 12.0 - 16.0 g/dL Final   HCT  Date Value Ref Range Status  10/31/2016 37.3 35.0 - 47.0 % Final   Hematocrit  Date Value Ref Range Status  10/25/2016 39.7 34.0 - 46.6 % Final    Physical Exam:  Vitals:   11/02/16 0410 11/02/16 0734 11/02/16 1027 11/02/16 1148  BP: (!) 147/83 (!) 142/86 (!) 154/106 139/78  Pulse: 98 94 (!) 104 91  Resp: 20 20 19 20   Temp: 98.6 F (37 C) 98.9 F (37.2 C)  98.9 F (37.2 C)  TempSrc: Oral   Oral  SpO2:        General: alert, cooperative and no distress Lochia: appropriate Uterine Fundus: firm Incision: None DVT Evaluation: No evidence of DVT seen on physical exam. Negative Homan's sign. No cords or calf tenderness. No significant calf/ankle edema.  Discharge Diagnoses: Term Pregnancy-delivered and Preelampsia  Discharge Information: Date: 11/02/2016 Activity: pelvic rest Diet: routine Medications: PNV, Ibuprofen, Colace and Labetalol Condition: stable Instructions: refer to practice specific booklet Discharge to: home Follow-up Information    Hildred LaserAnika Lakoda Raske, MD Follow up.   Specialties:  Obstetrics and Gynecology, Radiology Why:  1 week BP check 6 weeks for postpartum visit Contact information: [address] [place] [address] [phone]           Newborn Data: Live born female  Birth Weight: 4 lb 14.7 oz (2230 g) APGAR: 2, 7  Home with mother.  Hildred Lasernika Anothony Bursch 11/02/2016, 11:05 AM

## 2016-11-02 NOTE — Anesthesia Postprocedure Evaluation (Signed)
Anesthesia Post Note  Patient: Rhonda Davies  Procedure(s) Performed: * No procedures listed *  Patient location during evaluation: Mother Baby Anesthesia Type: Epidural Level of consciousness: awake, awake and alert and oriented Pain management: pain level controlled Vital Signs Assessment: post-procedure vital signs reviewed and stable Respiratory status: spontaneous breathing, nonlabored ventilation and respiratory function stable Cardiovascular status: stable Postop Assessment: no headache, no backache, patient able to bend at knees, no signs of nausea or vomiting and adequate PO intake Anesthetic complications: no    Last Vitals:  Vitals:   11/02/16 0410 11/02/16 0734  BP: (!) 147/83 (!) 142/86  Pulse: 98 94  Resp: 20 20  Temp: 37 C 37.2 C    Last Pain:  Vitals:   11/02/16 0730  TempSrc:   PainSc: 6                  Marlana SalvageSandra Oleda Borski

## 2016-11-02 NOTE — Discharge Instructions (Signed)

## 2016-11-02 NOTE — Progress Notes (Signed)
Patient understands all discharge instructions and the need to make follow up appointments and call physician of symptoms of high blood pressure. Mother rooming in with baby.

## 2016-11-02 NOTE — Progress Notes (Signed)
Post Partum Day # 3, s/p SVD, with severe pre-eclampsia s/p Magnesium Sulfate  Subjective: up ad lib, voiding and tolerating PO.  Notes headache has improved.  Currently a 3/10.    Objective: Temp:  [98.1 F (36.7 C)-98.9 F (37.2 C)] 98.9 F (37.2 C) (11/08 0734) Pulse Rate:  [94-105] 104 (11/08 1027) Resp:  [18-20] 19 (11/08 1027) BP: (125-154)/(74-106) 154/106 (11/08 1027) SpO2:  [98 %-99 %] 99 % (11/07 2313)  Physical Exam:  General: alert and no distress  Lungs: clear to auscultation bilaterally Breasts: normal appearance, no masses or tenderness Heart: regular rate and rhythm, S1, S2 normal, no murmur, click, rub or gallop Pelvis: Lochia: appropriate, Uterine Fundus: firm Extremities: DVT Evaluation: No evidence of DVT seen on physical exam.  Negative Homan's sign. No cords or calf tenderness. no pitting ankle edema, but bilateral swelling noted up to calves.     CBC Latest Ref Rng & Units 10/31/2016 10/30/2016 10/29/2016  WBC 3.6 - 11.0 K/uL 13.3(H) - 13.7(H)  Hemoglobin 12.0 - 16.0 g/dL 60.412.7 - 54.013.8  Hematocrit 35.0 - 47.0 % 37.3 - 38.9  Platelets 150 - 440 K/uL 138(L) 175 191    Assessment/Plan: Discharge home, Breastfeeding and Contraception OCPs Discontinued Procardia secondary to severe headache side effects.  BPs controlled on Labetalol 200 mg BID.  To continue outpatient. Headache has improved, was likely side effect of Procardia XL.  Infant currently under bilirubin lights.  May be able to d/c home today.  To f/u in clinic in 1 week for BP check.      LOS: 5 days   Hildred LaserAnika Garvey [place] [facility]

## 2016-11-02 NOTE — Lactation Note (Signed)
This note was copied from a baby's chart. Lactation Consultation Note  Patient Name: Rhonda Davies MWNUU'VToday's Date: 11/02/2016 Reason for consult: Follow-up assessment  Mom states plan of pump and bottle feed worked "well" last night. She wanted to try to nurse baby again today. Mom applied NS correctly and held baby in FB hold. Sleepy baby needed much coaxing to suck ( I later found out baby got formula prior to this feeding attempt which likely skewed results). No transfer of milk according to pre/post wt check. Mom to continue with plan to pump and bottle feed baby. She says baby now handles 15 ml well without spitting up. I suggested she slowly increase volume by 5-10 ml as tolerated as her volume needs will increase over time. OK to combine breast milk with formula. PLAN; Re-assess milk transfer at breast again tomorrow and adjust plan as needed.   Maternal Data    Feeding    LATCH Score/Interventions Latch: Repeated attempts needed to sustain latch, nipple held in mouth throughout feeding, stimulation needed to elicit sucking reflex. Intervention(s): Skin to skin;Teach feeding cues (nipple shield; deep latch) Intervention(s):  (mom still needs to pump and bottle feed. )  Audible Swallowing: A few with stimulation (not many; weak suck) Intervention(s): Hand expression  Type of Nipple: Everted at rest and after stimulation (large nipple)  Comfort (Breast/Nipple): Soft / non-tender     Hold (Positioning): Assistance needed to correctly position infant at breast and maintain latch. Intervention(s): Support Pillows  LATCH Score: 7  Lactation Tools Discussed/Used     Consult Status      Sunday CornSandra Clark Junie Engram 11/02/2016, 1:30 PM

## 2016-11-03 ENCOUNTER — Other Ambulatory Visit: Payer: 59

## 2016-11-08 ENCOUNTER — Ambulatory Visit (INDEPENDENT_AMBULATORY_CARE_PROVIDER_SITE_OTHER): Payer: 59 | Admitting: Obstetrics and Gynecology

## 2016-11-08 VITALS — BP 113/76 | HR 79 | Wt 200.6 lb

## 2016-11-08 DIAGNOSIS — O1410 Severe pre-eclampsia, unspecified trimester: Secondary | ICD-10-CM

## 2016-11-08 DIAGNOSIS — G4452 New daily persistent headache (NDPH): Secondary | ICD-10-CM

## 2016-11-08 DIAGNOSIS — O1414 Severe pre-eclampsia complicating childbirth: Secondary | ICD-10-CM

## 2016-11-08 NOTE — Progress Notes (Signed)
Patient ID: Rhonda Davies, female   DOB: 12/09/1991, 25 y.o.   MRN: [id number]  Pt presents for B/P check post partum. Had severe pre-eclampsia. B/P 113/76, P 76. Taking Labetalol 200mg . 2xd.  Pt states she continues with headache daily, no other symptoms. At home her B/P 120/?.  Pt states it is normal. Has appt with Dr. Valentino Saxonherry on 12/13/2016.  Currently no further f/u for B/P check.

## 2016-11-08 NOTE — Progress Notes (Signed)
I have reviewed the record and concur with patient management and plan.  Patient to continue with Ibuprofen/Tylenol ES for headaches, advised on caffeine intake.  Patient to notify MD if headache becomes unbearable, or is described as "worst headache of her life", or assoc. with vision changes.   Hildred LaserAnika Savina Olshefski, MD [facility]

## 2016-11-14 ENCOUNTER — Telehealth: Payer: Self-pay | Admitting: Obstetrics and Gynecology

## 2016-11-14 MED ORDER — DICLOXACILLIN SODIUM 500 MG PO CAPS: 500.0000 mg | ORAL_CAPSULE | Freq: Four times a day (QID) | ORAL | 0 refills | Status: AC

## 2016-11-14 NOTE — Telephone Encounter (Signed)
Please review

## 2016-11-14 NOTE — Telephone Encounter (Signed)
I spoke with pt and she does confirm red, hurting breast, with redness and streaks. Pt fever 99 orally last night. Infant does not have thrush, he is fine. Pt also informed to feed on that side, Tylenol as needed, cold compress to affected are, to increase po fluids and rest and contact office in 24-48 hrs if symptoms worsen or no improvement. ATB sent to pharmacy and pt aware.

## 2016-11-14 NOTE — Telephone Encounter (Signed)
Pt just pediatrics office and they told her she had mastitis. Her right breast is red, hurting, can't touch it, can't nurse from it. She is aching , headache. She wanted to know what she should do?

## 2016-12-08 ENCOUNTER — Encounter: Payer: Self-pay | Admitting: Obstetrics and Gynecology

## 2016-12-08 NOTE — Progress Notes (Deleted)
   OBSTETRICS POSTPARTUM CLINIC PROGRESS NOTE  Subjective:     Rhonda Davies is a 25 y.o. 161P1001 female who presents for a postpartum visit. She is 6 weeks postpartum following a spontaneous vaginal delivery. I have fully reviewed the prenatal and intrapartum course. The delivery was at 39 gestational weeks.  Anesthesia: epidural. Postpartum course has been ***. Baby's course has been well. Baby is feeding by {breast/bottle:69}. Bleeding: patient has/has not resumed menses, with No LMP recorded.. Bowel function is normal. Bladder function is normal. Patient {is/is not:9024} sexually active. Contraception method desired is {contraceptive method:5051}. Postpartum depression screening: {neg default:13464::"negative"}.  {Common ambulatory SmartLinks:19316}  Review of Systems {ros; complete:30496}   Objective:    There were no vitals taken for this visit.  General:  alert and no distress   Breasts:  inspection negative, no nipple discharge or bleeding, no masses or nodularity palpable  Lungs: clear to auscultation bilaterally  Heart:  regular rate and rhythm, S1, S2 normal, no murmur, click, rub or gallop  Abdomen: soft, non-tender; bowel sounds normal; no masses,  no organomegaly.  ***Well healed Pfannenstiel incision   Vulva:  normal  Vagina: normal vagina, no discharge, exudate, lesion, or erythema  Cervix:  no cervical motion tenderness and no lesions  Corpus: normal size, contour, position, consistency, mobility, non-tender  Adnexa:  normal adnexa and no mass, fullness, tenderness  Rectal Exam: Not performed.         Labs:  Lab Results  Component Value Date   HGB 12.7 10/31/2016     Assessment:    *** postpartum exam.   @DIAGNOSES @   Plan:    1. Contraception: {method:5051} 2. Will check Hgb for h/o anemia.  3. Follow up in: {1-10:13787} {time; units:19136} or as needed.    Debbe Baleskinawa Sulayman Manning, CMA [facility]

## 2016-12-13 ENCOUNTER — Encounter: Payer: 59 | Admitting: Obstetrics and Gynecology

## 2016-12-13 ENCOUNTER — Encounter: Payer: Self-pay | Admitting: Obstetrics and Gynecology

## 2016-12-13 ENCOUNTER — Ambulatory Visit (INDEPENDENT_AMBULATORY_CARE_PROVIDER_SITE_OTHER): Payer: 59 | Admitting: Obstetrics and Gynecology

## 2016-12-13 DIAGNOSIS — F53 Postpartum depression: Secondary | ICD-10-CM

## 2016-12-13 DIAGNOSIS — R87610 Atypical squamous cells of undetermined significance on cytologic smear of cervix (ASC-US): Secondary | ICD-10-CM

## 2016-12-13 DIAGNOSIS — O99345 Other mental disorders complicating the puerperium: Secondary | ICD-10-CM

## 2016-12-13 DIAGNOSIS — O9270 Unspecified disorders of lactation: Secondary | ICD-10-CM

## 2016-12-13 NOTE — Patient Instructions (Signed)
Breastfeeding and Inducing Lactation Induced lactation is using hormones or other medicines and breast stimulation to help you produce breast milk. You may want to try induced lactation if you:  Are adopting a baby.   Are having a surrogate mother carry your baby.   Have to stop breastfeeding for a period of time.  HOW DOES IT WORK? During pregnancy, your hormones change to prepare your body to produce breast milk. After pregnancy, hormones signal your body to start making breast milk to feed your baby. When you do not go through these changes, it may be hard for you to produce enough breast milk to feed your baby. Your health care provider and a lactation consultant can help you produce milk using medicine and breast stimulation.  WILL I MAKE ENOUGH MILK TO FEED MY BABY? Induced lactation may be successful. However, very few women who use induced lactation to produce breast milk can make all the milk their baby needs. You may need to feed your baby with donated breast milk or infant formula in addition to your breast milk. This will make sure your baby gets adequate nutrition. Induced lactation is usually more successful if you have been pregnant before.  HOW DOES MY BODY PRODUCE MILK? To induce lactation, you will start taking hormones 3-4 months before you want to start breastfeeding. You will continue to take them until about 6 weeks before the baby arrives. When you stop taking the hormones, you will need to perform breast stimulation a number of times per day to encourage breast milk production. Breast stimulation can be performed by gently rubbing and stretching the nipple tissue. Breast stimulation can also be performed using a double electric hospital-grade pump to mimic a baby's suckling at the breast. Try to pump every 3 hours (8 times a day) for 20 minutes on each breast. If you are unable to pump that many times each day, do it as often as possible. This helps your body continue to make  milk. When you put your baby to your breast, your body may also respond to the smell, sound, and feel of your baby by increasing the amount of milk you produce.  Your health care provider may also prescribe medicine to stimulate lactation and increase your milk supply. Herbal medicines are also available to try to induce lactation. It is important to know that these medicines are not approved or regulated by the FDA. Always check with your health care provider before using any herbal medicine. WHAT ELSE DO I NEED TO KNOW?  You should only take hormones and medicines as directed by your health care provider or trained lactation consultant.  Talk to your health care provider or lactation consultant if you need guidance. He or she may be able to help you start a milk supply and advise you as you make important decisions about nourishing your baby.  Try using a supplemental nursing system to provide extra donated breast milk or formula at the breast while the baby nurses. This ensures that your infant gets enough nutrition while you are breastfeeding. Ask a lactation specialist to help you find and use this device. This information is not intended to replace advice given to you by your health care provider. Make sure you discuss any questions you have with your health care provider. Document Released: 12/12/2005 Document Revised: 12/17/2013 Document Reviewed: 10/04/2013 Elsevier Interactive Patient Education  2017 Elsevier Inc.  

## 2016-12-13 NOTE — Progress Notes (Signed)
   OBSTETRICS POSTPARTUM CLINIC PROGRESS NOTE  Subjective:     Rhonda Davies is a 25 y.o. 31P1001 female who presents for a postpartum visit. She is 6 weeks postpartum following a spontaneous vaginal delivery. I have fully reviewed the prenatal and intrapartum course. The delivery was at 39 gestational weeks, induction for pre-eclampsia.  Anesthesia: epidural. Postpartum course has been well. Baby's course has been well. Baby is feeding by both breast and bottle - Similac Advance. Bleeding: patient has/has not resumed menses, with No LMP recorded.. Bowel function is normal. Bladder function is normal. Patient is sexually active. Contraception method desired is undecided, given patient education . Postpartum depression screening: negative PHG-9 score 8.  The following portions of the patient's history were reviewed and updated as appropriate: allergies, current medications, past family history, past medical history, past social history, past surgical history and problem list.  Review of Systems A comprehensive review of systems was negative except for: Integument/breast: positive for decreased milk supply   Objective:    BP 118/74 (BP Location: Left Arm, Patient Position: Sitting, Cuff Size: Large)   Pulse (!) 103   Ht 5\' 4"  (1.626 m)   Wt 211 lb 8 oz (95.9 kg)   Breastfeeding? Yes   BMI 36.30 kg/m     General:  alert and no distress   Breasts:  inspection negative, no nipple discharge or bleeding, no masses or nodularity palpable  Lungs: clear to auscultation bilaterally  Heart:  regular rate and rhythm, S1, S2 normal, no murmur, click, rub or gallop  Abdomen: soft, non-tender; bowel sounds normal; no masses,  no organomegaly.     Vulva:  normal  Vagina: normal vagina, no discharge, exudate, lesion, or erythema  Cervix:  no cervical motion tenderness and no lesions  Corpus: normal size, contour, position, consistency, mobility, non-tender  Adnexa:  normal adnexa and no mass,  fullness, tenderness  Rectal Exam: Not performed.         Labs:  Lab Results  Component Value Date   HGB 12.7 10/31/2016     Assessment:   Routine postpartum exam.   Abnormal pap smear (ASCUS HR HPV+) Lactational problems Mild postpartum depression  Plan:   1.Contraception: condoms 2. Abnormal pap smear (ASCUS HR HPV +), will management based on ASCCP guidelines for this age group, will repeat pap smear 1 year from prior pap. Scheduled in 4-6 weeks for annual exam.  3. Lactational problems (decreased milk supply).  Is [facility].  Also discussed OTC supplements that patient can try to increase milk supply (milk thistle, Fennugreek) and increasing hydration with water (patient reports mostly drinking sodas).  4. Mild postpartum depression based on score, however patient notes that she does not feel depressed.  Is mostly tired from being a new mom and having low energy and is sometimes very "emotional". Declines referral for psychotherapy.  Follow up in: 4-6 weeks or as needed.    Hildred LaserAnika Locklyn Henriquez, MD [facility]

## 2016-12-14 ENCOUNTER — Encounter: Payer: Self-pay | Admitting: Obstetrics and Gynecology

## 2016-12-14 DIAGNOSIS — Z8619 Personal history of other infectious and parasitic diseases: Secondary | ICD-10-CM

## 2016-12-14 HISTORY — DX: Personal history of other infectious and parasitic diseases: Z86.19

## 2017-01-10 NOTE — Progress Notes (Signed)
ANNUAL PREVENTATIVE CARE GYN  ENCOUNTER NOTE  Subjective:       Rhonda Davies is a 26 y.o. 151P1001 female here for a routine annual gynecologic exam.    Rhonda Davies is doing well. Her daughter, Rhonda Davies, is three (3) months old.   She is sexual active. She wears seatbelts. She has six (6) professional tattoos. She has resumed exercised and desires to "work on her weight".   She is not happy using condoms as contraception, but does not want to take anything that will cause her to gain weight.   Denies difficulty breathing or respiratory distress, chest pain, abdominal pain, unexplained vaginal bleeding, and leg pain or swelling.    Gynecologic History  Patient's last menstrual period was 01/11/2017. Heavy  Contraception: condoms   Last Pap: 04/26/2016. Results were: abnormal; repeat in one year  Obstetric History OB History  Gravida Para Term Preterm AB Living  1 1 1  0 0 1  SAB TAB Ectopic Multiple Live Births  0 0 0 0 1    # Outcome Date GA Lbr Len/2nd Weight Sex Delivery Anes PTL Lv  1 Term 10/30/16 [id number] 00:55 4 lb 14.7 oz (2.23 kg) F Vag-Spont EPI  LIV      Past Medical History:  Diagnosis Date  . Amenorrhea   . H/O chlamydia infection 01/27/2016   Chlamydia infection 12/2015, treated.    . H/O trichomoniasis 12/14/2016  . History of chlamydia infection   . History of trichomoniasis   . Hypertension in pregnancy, preeclampsia, delivered   . Kidney stone   . Left groin pain   . Personal history UTI     Past Surgical History:  Procedure Laterality Date  . CYST REMOVED  2013   IN NECK  . TONSILLECTOMY AND ADENOIDECTOMY  2007    No current outpatient prescriptions on file prior to visit.   No current facility-administered medications on file prior to visit.     Allergies  Allergen Reactions  . Benzonatate Swelling    Social History   Social History  . Marital status: Single    Spouse name: N/A  . Number of children: N/A  . Years of education: N/A    Occupational History  . day care worker    Social History Main Topics  . Smoking status: Never Smoker  . Smokeless tobacco: Never Used  . Alcohol use No  . Drug use: No  . Sexual activity: Yes    Partners: Male    Birth control/ protection: Condom   Other Topics Concern  . Not on file   Social History Narrative   Single.    Currently pregnant.   Works as a Building surveyorDaycare Teacher.    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   Enjoys mudding, fishing.     Family History  Problem Relation Age of Onset  . Heart disease Father   . Diabetes Maternal Aunt   . Heart disease Maternal Grandfather   . Diabetes Maternal Grandfather   . Diabetes Paternal Grandmother   . Heart disease Paternal Grandfather   . Breast cancer Neg Hx   . Colon cancer Neg Hx   . Ovarian cancer Neg Hx     The following portions of the patient's history were reviewed and updated as appropriate: allergies, current medications, past family history, past medical history, past social history, past surgical history and problem list.  Review of Systems ROS   Review of Systems - negative except for as noted above. Information obtained from patient.  Objective:   BP 117/77 (BP Location: Left Arm, Patient Position: Sitting, Cuff Size: Normal)   Pulse 92   Ht 5' (1.524 m)   Wt 216 lb 12.8 oz (98.3 kg)   LMP 01/11/2017   Breastfeeding? No   BMI 42.34 kg/m    CONSTITUTIONAL: Well-developed, well-nourished female in no acute distress.   PSYCHIATRIC: Normal mood and affect. Normal behavior. Normal judgment and thought content.  NEUROLGIC: Alert and oriented to person, place, and time. Normal muscle tone coordination. No cranial nerve deficit noted.  HENT:  Normocephalic, atraumatic, External right and left ear normal.   EYES: Conjunctivae and EOM are normal.  NECK: Normal range of motion, supple, no masses.  Normal thyroid.   SKIN: Skin is warm and dry. No rash noted. Not diaphoretic. No erythema. No pallor. Six  (6) professional tattoos.   CARDIOVASCULAR: Normal heart rate noted, regular rhythm, no murmur.  RESPIRATORY: Clear to auscultation bilaterally. Effort and breath sounds normal, no problems with respiration noted.  BREASTS: Symmetric in size. No masses, skin changes, nipple drainage, or lymphadenopathy.  ABDOMEN: Soft, normal bowel sounds, no distention noted.  No tenderness, rebound or guarding.   PELVIC:  External Genitalia: Normal  Vagina: Normal  Cervix: Normal  Uterus: Normal  Adnexa: Normal   MUSCULOSKELETAL: Normal range of motion. No tenderness.  No cyanosis, clubbing, or edema.  2+ distal pulses.  LYMPHATIC: No Axillary, Supraclavicular, or Inguinal Adenopathy.  Assessment:   Annual gynecologic examination 26 y.o.   Contraception: condoms   Obesity Class 3   Problem List Items Addressed This Visit    None    Visit Diagnoses    Well woman exam    -  Primary   Relevant Orders   CBC with Differential/Platelet      Plan:   Pap: repeat 04/2017 and NuSwab collected  Labs: CBC   Education given regarding options for contraception, including IUD placement.  The current method of family planning is condoms most of the time.  Routine preventative health maintenance measures emphasized: Exercise/Diet/Weight control and Safe Sex  RTC x 4 months for PAP smear.  F/U as needed for IUD placement, if desired.    Gunnar Bulla, CNM

## 2017-01-24 ENCOUNTER — Ambulatory Visit (INDEPENDENT_AMBULATORY_CARE_PROVIDER_SITE_OTHER): Payer: 59 | Admitting: Obstetrics and Gynecology

## 2017-01-24 ENCOUNTER — Encounter: Payer: Self-pay | Admitting: Obstetrics and Gynecology

## 2017-01-24 VITALS — BP 117/77 | HR 92 | Ht 60.0 in | Wt 216.8 lb

## 2017-01-24 DIAGNOSIS — Z01419 Encounter for gynecological examination (general) (routine) without abnormal findings: Secondary | ICD-10-CM

## 2017-01-24 DIAGNOSIS — Z113 Encounter for screening for infections with a predominantly sexual mode of transmission: Secondary | ICD-10-CM

## 2017-01-25 LAB — CBC WITH DIFFERENTIAL/PLATELET
Basophils Absolute: 0 10*3/uL (ref 0.0–0.2)
Basos: 0 %
EOS (ABSOLUTE): 0.4 10*3/uL (ref 0.0–0.4)
EOS: 6 %
HEMATOCRIT: 37.1 % (ref 34.0–46.6)
HEMOGLOBIN: 12 g/dL (ref 11.1–15.9)
IMMATURE GRANULOCYTES: 0 %
Immature Grans (Abs): 0 10*3/uL (ref 0.0–0.1)
Lymphocytes Absolute: 1.5 10*3/uL (ref 0.7–3.1)
Lymphs: 21 %
MCH: 26.5 pg — ABNORMAL LOW (ref 26.6–33.0)
MCHC: 32.3 g/dL (ref 31.5–35.7)
MCV: 82 fL (ref 79–97)
MONOCYTES: 7 %
Monocytes Absolute: 0.5 10*3/uL (ref 0.1–0.9)
Neutrophils Absolute: 4.6 10*3/uL (ref 1.4–7.0)
Neutrophils: 66 %
Platelets: 293 10*3/uL (ref 150–379)
RBC: 4.53 x10E6/uL (ref 3.77–5.28)
RDW: 13.4 % (ref 12.3–15.4)
WBC: 7 10*3/uL (ref 3.4–10.8)

## 2017-01-27 LAB — NUSWAB VAGINITIS PLUS (VG+)
CHLAMYDIA TRACHOMATIS, NAA: NEGATIVE
Candida albicans, NAA: NEGATIVE
Candida glabrata, NAA: NEGATIVE
NEISSERIA GONORRHOEAE, NAA: NEGATIVE
Trich vag by NAA: NEGATIVE

## 2017-03-20 ENCOUNTER — Emergency Department
Admission: EM | Admit: 2017-03-20 | Discharge: 2017-03-20 | Disposition: A | Discharge status: Home / Self Care | Payer: Self-pay | Attending: Emergency Medicine | Admitting: Emergency Medicine

## 2017-03-20 ENCOUNTER — Emergency Department: Payer: Self-pay

## 2017-03-20 ENCOUNTER — Encounter: Payer: Self-pay | Admitting: Emergency Medicine

## 2017-03-20 DIAGNOSIS — N3001 Acute cystitis with hematuria: Secondary | ICD-10-CM | POA: Insufficient documentation

## 2017-03-20 DIAGNOSIS — R1030 Lower abdominal pain, unspecified: Secondary | ICD-10-CM

## 2017-03-20 DIAGNOSIS — R109 Unspecified abdominal pain: Secondary | ICD-10-CM

## 2017-03-20 DIAGNOSIS — N72 Inflammatory disease of cervix uteri: Secondary | ICD-10-CM | POA: Insufficient documentation

## 2017-03-20 HISTORY — DX: Gestational (pregnancy-induced) hypertension without significant proteinuria, unspecified trimester: O13.9

## 2017-03-20 LAB — CBC
HEMATOCRIT: 36.3 % (ref 35.0–47.0)
Hemoglobin: 12.1 g/dL (ref 12.0–16.0)
MCH: 26.3 pg (ref 26.0–34.0)
MCHC: 33.4 g/dL (ref 32.0–36.0)
MCV: 78.5 fL — AB (ref 80.0–100.0)
Platelets: 223 10*3/uL (ref 150–440)
RBC: 4.63 MIL/uL (ref 3.80–5.20)
RDW: 14.4 % (ref 11.5–14.5)
WBC: 5.8 10*3/uL (ref 3.6–11.0)

## 2017-03-20 LAB — COMPREHENSIVE METABOLIC PANEL
ALT: 20 U/L (ref 14–54)
AST: 26 U/L (ref 15–41)
Albumin: 4.1 g/dL (ref 3.5–5.0)
Alkaline Phosphatase: 68 U/L (ref 38–126)
Anion gap: 8 (ref 5–15)
BILIRUBIN TOTAL: 0.8 mg/dL (ref 0.3–1.2)
BUN: 15 mg/dL (ref 6–20)
CHLORIDE: 105 mmol/L (ref 101–111)
CO2: 23 mmol/L (ref 22–32)
Calcium: 8.9 mg/dL (ref 8.9–10.3)
Creatinine, Ser: 0.75 mg/dL (ref 0.44–1.00)
GFR calc non Af Amer: >60 mL/min (ref >60)
Glucose, Bld: 109 mg/dL — ABNORMAL HIGH (ref 65–99)
POTASSIUM: 3.6 mmol/L (ref 3.5–5.1)
Sodium: 136 mmol/L (ref 135–145)
TOTAL PROTEIN: 7 g/dL (ref 6.5–8.1)

## 2017-03-20 LAB — URINALYSIS, COMPLETE (UACMP) WITH MICROSCOPIC
BACTERIA UA: NONE SEEN
BILIRUBIN URINE: NEGATIVE
Glucose, UA: NEGATIVE mg/dL
KETONES UR: NEGATIVE mg/dL
LEUKOCYTES UA: NEGATIVE
NITRITE: POSITIVE — AB
Protein, ur: NEGATIVE mg/dL
SPECIFIC GRAVITY, URINE: 1.018 (ref 1.005–1.030)
pH: 5 (ref 5.0–8.0)

## 2017-03-20 LAB — CHLAMYDIA/NGC RT PCR (ARMC ONLY)
CHLAMYDIA TR: NOT DETECTED
N GONORRHOEAE: NOT DETECTED

## 2017-03-20 LAB — WET PREP, GENITAL
Clue Cells Wet Prep HPF POC: NONE SEEN
SPERM: NONE SEEN
Trich, Wet Prep: NONE SEEN
Yeast Wet Prep HPF POC: NONE SEEN

## 2017-03-20 LAB — POCT PREGNANCY, URINE: Preg Test, Ur: NEGATIVE

## 2017-03-20 LAB — LIPASE, BLOOD: LIPASE: 14 U/L (ref 11–51)

## 2017-03-20 MED ORDER — DOXYCYCLINE HYCLATE 100 MG PO TABS
100.0000 mg | ORAL_TABLET | Freq: Once | ORAL | Status: AC
  Administered 2017-03-20: 100 mg via ORAL
  Filled 2017-03-20: qty 1

## 2017-03-20 MED ORDER — TRAMADOL HCL 50 MG PO TABS: 50.0000 mg | ORAL_TABLET | Freq: Four times a day (QID) | ORAL | 0 refills | Status: DC | PRN

## 2017-03-20 MED ORDER — CEFTRIAXONE SODIUM 250 MG IJ SOLR
250.0000 mg | Freq: Once | INTRAMUSCULAR | Status: AC
  Administered 2017-03-20: 250 mg via INTRAMUSCULAR
  Filled 2017-03-20: qty 250

## 2017-03-20 MED ORDER — DOXYCYCLINE HYCLATE 100 MG PO TABS: 100.0000 mg | ORAL_TABLET | Freq: Two times a day (BID) | ORAL | 0 refills | Status: DC

## 2017-03-20 MED ORDER — LORAZEPAM 2 MG/ML IJ SOLN
INTRAMUSCULAR | Status: AC
  Filled 2017-03-20: qty 1

## 2017-03-20 MED ORDER — CEPHALEXIN 500 MG PO CAPS: 500.0000 mg | ORAL_CAPSULE | Freq: Four times a day (QID) | ORAL | 0 refills | Status: AC

## 2017-03-20 NOTE — ED Notes (Signed)
ED tech at bedside, setting up pelvic cart for MD examination; introduced self to pt and will return when exam complete

## 2017-03-20 NOTE — ED Triage Notes (Addendum)
Patient reports noticed blood in her urine tonight.  Also reports lower abdominal pain and lower back pain.

## 2017-03-20 NOTE — Discharge Instructions (Signed)
Please follow up with your primary care physician.

## 2017-03-20 NOTE — ED Provider Notes (Signed)
[facility] Emergency Department Provider Note   ____________________________________________   First MD Initiated Contact with Patient 03/20/17 [phone]     (approximate)  I have reviewed the triage vital signs and the nursing notes.   HISTORY  Chief Complaint Hematuria and Abdominal Pain    HPI Rhonda Davies is a 26 y.o. female who comes into the hospital today with bleeding from her vaginal area. She reports that started today. She states that it also seems like she is bleeding when she urinates. The patient reports it started around midnight. She's had a sore back as well and some abdominal pain. The patient reports she feels a knot when she wipes her vagina. It was a good amount of bleeding. She states that she finished her menstrual cycle on Thursday and it was normal. She has been taking Aleve for her back pain. She also took Azo tonight. She did have some burning with urination and some aches and headache. Patient denies any fever, nausea, vomiting. The patient decided to come into the hospital today for evaluation of these symptoms.   Past Medical History:  Diagnosis Date  . Amenorrhea   . H/O chlamydia infection 01/27/2016   Chlamydia infection 12/2015, treated.    . H/O trichomoniasis 12/14/2016  . History of chlamydia infection   . History of trichomoniasis   . Kidney stone   . Left groin pain   . Personal history UTI   . Pregnancy induced hypertension     Patient Active Problem List   Diagnosis Date Noted  . H/O trichomoniasis 12/14/2016  . ASCUS with positive high risk HPV 05/29/2016  . Obesity (BMI 35.0-39.9 without comorbidity) 04/26/2016  . H/O chlamydia infection 01/27/2016  . Dysfunctional uterine bleeding 06/05/2015    Past Surgical History:  Procedure Laterality Date  . CYST REMOVED  2013   IN NECK  . TONSILLECTOMY AND ADENOIDECTOMY  2007    Prior to Admission medications   Medication Sig Start Date End Date Taking?  Authorizing Provider  cephALEXin (KEFLEX) 500 MG capsule Take 1 capsule (500 mg total) by mouth 4 (four) times daily. 03/20/17 03/30/17  Rebecka ApleyAllison P Zaylyn Bergdoll, MD  doxycycline (VIBRA-TABS) 100 MG tablet Take 1 tablet (100 mg total) by mouth 2 (two) times daily. 03/20/17   Rebecka ApleyAllison P Grant Swager, MD  traMADol (ULTRAM) 50 MG tablet Take 1 tablet (50 mg total) by mouth every 6 (six) hours as needed. 03/20/17   Rebecka ApleyAllison P Eathel Pajak, MD    Allergies Benzonatate  Family History  Problem Relation Age of Onset  . Heart disease Father   . Diabetes Maternal Aunt   . Heart disease Maternal Grandfather   . Diabetes Maternal Grandfather   . Diabetes Paternal Grandmother   . Heart disease Paternal Grandfather   . Breast cancer Neg Hx   . Colon cancer Neg Hx   . Ovarian cancer Neg Hx     Social History Social History  Substance Use Topics  . Smoking status: Never Smoker  . Smokeless tobacco: Never Used  . Alcohol use No    Review of Systems Constitutional: No fever/chills Eyes: No visual changes. ENT: No sore throat. Cardiovascular: Denies chest pain. Respiratory: Denies shortness of breath. Gastrointestinal:  abdominal pain.  No nausea, no vomiting.  No diarrhea.  No constipation. Genitourinary:  Dysuria vaginal bleeding Musculoskeletal: back pain. Skin: Negative for rash. Neurological: headache  10-point ROS otherwise negative.  ____________________________________________   PHYSICAL EXAM:  VITAL SIGNS: ED Triage Vitals  Enc Vitals  Group     BP 03/20/17 0018 127/77     Pulse Rate 03/20/17 0018 (!) 110     Resp 03/20/17 0018 18     Temp 03/20/17 0018 98.9 F (37.2 C)     Temp Source 03/20/17 0018 Oral     SpO2 03/20/17 0018 99 %     Weight 03/20/17 0016 215 lb (97.5 kg)     Height 03/20/17 0016 5\' 3"  (1.6 m)     Head Circumference --      Peak Flow --      Pain Score 03/20/17 0016 7     Pain Loc --      Pain Edu? --      Excl. in GC? --     Constitutional: Alert and oriented.  Well appearing and in moderate distress. Eyes: Conjunctivae are normal. PERRL. EOMI. Head: Atraumatic. Nose: No congestion/rhinnorhea. Mouth/Throat: Mucous membranes are moist.  Oropharynx non-erythematous. Cardiovascular: Normal rate, regular rhythm. Grossly normal heart sounds.  Good peripheral circulation. Respiratory: Normal respiratory effort.  No retractions. Lungs CTAB. Gastrointestinal: Soft and nontender. No distention. Positive bowel sounds Genitourinary: CMT with some erythema to the cervix and some purulence. Midline tenderness to palpation Musculoskeletal: No lower extremity tenderness nor edema.  Neurologic:  Normal speech and language.  Skin:  Skin is warm, dry and intact.  Psychiatric: Mood and affect are normal.   ____________________________________________   LABS (all labs ordered are listed, but only abnormal results are displayed)  Labs Reviewed  WET PREP, GENITAL - Abnormal; Notable for the following:       Result Value   WBC, Wet Prep HPF POC MANY (*)    All other components within normal limits  COMPREHENSIVE METABOLIC PANEL - Abnormal; Notable for the following:    Glucose, Bld 109 (*)    All other components within normal limits  CBC - Abnormal; Notable for the following:    MCV 78.5 (*)    All other components within normal limits  URINALYSIS, COMPLETE (UACMP) WITH MICROSCOPIC - Abnormal; Notable for the following:    Color, Urine AMBER (*)    APPearance CLEAR (*)    Hgb urine dipstick MODERATE (*)    Nitrite POSITIVE (*)    Squamous Epithelial / LPF 0-5 (*)    All other components within normal limits  CHLAMYDIA/NGC RT PCR ([facility] ONLY)  URINE CULTURE  LIPASE, BLOOD  POC URINE PREG, ED  POCT PREGNANCY, URINE   ____________________________________________  EKG  none ____________________________________________  RADIOLOGY  US pelvis ____________________________________________   PROCEDURES  Procedure(s) performed:  None  Procedures  Critical Care performed: No  ____________________________________________   INITIAL IMPRESSION / ASSESSMENT AND PLAN / ED COURSE  Pertinent labs & imaging results that were available during my care of the patient were reviewed by me and considered in my medical decision making (see chart for details).  This is a 26 year old female who comes into the hospital today with some abdominal pain, vaginal bleeding, burning with urination and back pain. The patient's blood work is unremarkable but she does have some hemoglobin in her urine as well as some traits. I did perform a wet prep which had many white blood cells. The patient's GC and chlamydia was negative but given her cervical motion tenderness I feel it appropriate to treat the patient for cervicitis. I'll give the patient some ceftriaxone and doxycycline. The patient's ultrasound is negative. I feel that the patient should be treated for UTI and cervicitis. I will give the  patient some antibiotics for home as well as some tramadol. She should return with any worsening symptoms.  Clinical Course as of Mar 20 729  Mon Mar 20, 2017  0614 Normal pelvic ultrasound with Doppler. Normal blood flow to both ovaries   US Pelvis Complete [AW]    Clinical Course User Index [AW] Rebecka Apley, MD     ____________________________________________   FINAL CLINICAL IMPRESSION(S) / ED DIAGNOSES  Final diagnoses:  Abdominal pain  Cervicitis  Lower abdominal pain  Acute cystitis with hematuria      NEW MEDICATIONS STARTED DURING THIS VISIT:  Discharge Medication List as of 03/20/2017  6:51 AM    START taking these medications   Details  cephALEXin (KEFLEX) 500 MG capsule Take 1 capsule (500 mg total) by mouth 4 (four) times daily., Starting Mon 03/20/2017, Until Thu 03/30/2017, Print    doxycycline (VIBRA-TABS) 100 MG tablet Take 1 tablet (100 mg total) by mouth 2 (two) times daily., Starting Mon 03/20/2017, Print     traMADol (ULTRAM) 50 MG tablet Take 1 tablet (50 mg total) by mouth every 6 (six) hours as needed., Starting Mon 03/20/2017, Print         Note:  This document was prepared using Dragon voice recognition software and may include unintentional dictation errors.    Rebecka Apley, MD 03/20/17 0730

## 2017-03-21 LAB — URINE CULTURE: CULTURE: NO GROWTH

## 2018-01-25 ENCOUNTER — Ambulatory Visit (INDEPENDENT_AMBULATORY_CARE_PROVIDER_SITE_OTHER): Payer: BLUE CROSS/BLUE SHIELD | Admitting: Obstetrics and Gynecology

## 2018-01-25 ENCOUNTER — Encounter: Payer: Self-pay | Admitting: Obstetrics and Gynecology

## 2018-01-25 VITALS — BP 111/75 | HR 102 | Ht 64.0 in | Wt 241.4 lb

## 2018-01-25 DIAGNOSIS — Z87898 Personal history of other specified conditions: Secondary | ICD-10-CM | POA: Diagnosis not present

## 2018-01-25 DIAGNOSIS — N3000 Acute cystitis without hematuria: Secondary | ICD-10-CM | POA: Diagnosis not present

## 2018-01-25 DIAGNOSIS — Z01419 Encounter for gynecological examination (general) (routine) without abnormal findings: Secondary | ICD-10-CM

## 2018-01-25 DIAGNOSIS — Z8742 Personal history of other diseases of the female genital tract: Secondary | ICD-10-CM

## 2018-01-25 MED ORDER — NITROFURANTOIN MONOHYD MACRO 100 MG PO CAPS: 100.0000 mg | ORAL_CAPSULE | Freq: Two times a day (BID) | ORAL | 1 refills | Status: DC

## 2018-01-25 NOTE — Progress Notes (Signed)
Possible UTI test was done PT states both kidneys hurts urine smelled strong

## 2018-01-25 NOTE — Patient Instructions (Addendum)
Health Maintenance, Female Adopting a healthy lifestyle and getting preventive care can go a long way to promote health and wellness. Talk with your health care provider about what schedule of regular examinations is right for you. This is a good chance for you to check in with your provider about disease prevention and staying healthy. In between checkups, there are plenty of things you can do on your own. Experts have done a lot of research about which lifestyle changes and preventive measures are most likely to keep you healthy. Ask your health care provider for more information. Weight and diet Eat a healthy diet  Be sure to include plenty of vegetables, fruits, low-fat dairy products, and lean protein.  Do not eat a lot of foods high in solid fats, added sugars, or salt.  Get regular exercise. This is one of the most important things you can do for your health. ? Most adults should exercise for at least 150 minutes each week. The exercise should increase your heart rate and make you sweat (moderate-intensity exercise). ? Most adults should also do strengthening exercises at least twice a week. This is in addition to the moderate-intensity exercise.  Maintain a healthy weight  Body mass index (BMI) is a measurement that can be used to identify possible weight problems. It estimates body fat based on height and weight. Your health care provider can help determine your BMI and help you achieve or maintain a healthy weight.  For females 20 years of age and older: ? A BMI below 18.5 is considered underweight. ? A BMI of 18.5 to 24.9 is normal. ? A BMI of 25 to 29.9 is considered overweight. ? A BMI of 30 and above is considered obese.  Watch levels of cholesterol and blood lipids  You should start having your blood tested for lipids and cholesterol at 25 years of age, then have this test every 5 years.  You may need to have your cholesterol levels checked more often if: ? Your lipid or  cholesterol levels are high. ? You are older than 25 years of age. ? You are at high risk for heart disease.  Cancer screening Lung Cancer  Lung cancer screening is recommended for adults 55-80 years old who are at high risk for lung cancer because of a history of smoking.  A yearly low-dose CT scan of the lungs is recommended for people who: ? Currently smoke. ? Have quit within the past 15 years. ? Have at least a 30-pack-year history of smoking. A pack year is smoking an average of one pack of cigarettes a day for 1 year.  Yearly screening should continue until it has been 15 years since you quit.  Yearly screening should stop if you develop a health problem that would prevent you from having lung cancer treatment.  Breast Cancer  Practice breast self-awareness. This means understanding how your breasts normally appear and feel.  It also means doing regular breast self-exams. Let your health care provider know about any changes, no matter how small.  If you are in your 20s or 30s, you should have a clinical breast exam (CBE) by a health care provider every 1-3 years as part of a regular health exam.  If you are 40 or older, have a CBE every year. Also consider having a breast X-ray (mammogram) every year.  If you have a family history of breast cancer, talk to your health care provider about genetic screening.  If you are at high risk   for breast cancer, talk to your health care provider about having an MRI and a mammogram every year.  Breast cancer gene (BRCA) assessment is recommended for women who have family members with BRCA-related cancers. BRCA-related cancers include: ? Breast. ? Ovarian. ? Tubal. ? Peritoneal cancers.  Results of the assessment will determine the need for genetic counseling and BRCA1 and BRCA2 testing.  Cervical Cancer Your health care provider may recommend that you be screened regularly for cancer of the pelvic organs (ovaries, uterus, and  vagina). This screening involves a pelvic examination, including checking for microscopic changes to the surface of your cervix (Pap test). You may be encouraged to have this screening done every 3 years, beginning at age 22.  For women ages 56-65, health care providers may recommend pelvic exams and Pap testing every 3 years, or they may recommend the Pap and pelvic exam, combined with testing for human papilloma virus (HPV), every 5 years. Some types of HPV increase your risk of cervical cancer. Testing for HPV may also be done on women of any age with unclear Pap test results.  Other health care providers may not recommend any screening for nonpregnant women who are considered low risk for pelvic cancer and who do not have symptoms. Ask your health care provider if a screening pelvic exam is right for you.  If you have had past treatment for cervical cancer or a condition that could lead to cancer, you need Pap tests and screening for cancer for at least 20 years after your treatment. If Pap tests have been discontinued, your risk factors (such as having a new sexual partner) need to be reassessed to determine if screening should resume. Some women have medical problems that increase the chance of getting cervical cancer. In these cases, your health care provider may recommend more frequent screening and Pap tests.  Colorectal Cancer  This type of cancer can be detected and often prevented.  Routine colorectal cancer screening usually begins at 25 years of age and continues through 25 years of age.  Your health care provider may recommend screening at an earlier age if you have risk factors for colon cancer.  Your health care provider may also recommend using home test kits to check for hidden blood in the stool.  A small camera at the end of a tube can be used to examine your colon directly (sigmoidoscopy or colonoscopy). This is done to check for the earliest forms of colorectal  cancer.  Routine screening usually begins at age 33.  Direct examination of the colon should be repeated every 5-10 years through 25 years of age. However, you may need to be screened more often if early forms of precancerous polyps or small growths are found.  Skin Cancer  Check your skin from head to toe regularly.  Tell your health care provider about any new moles or changes in moles, especially if there is a change in a mole's shape or color.  Also tell your health care provider if you have a mole that is larger than the size of a pencil eraser.  Always use sunscreen. Apply sunscreen liberally and repeatedly throughout the day.  Protect yourself by wearing long sleeves, pants, a wide-brimmed hat, and sunglasses whenever you are outside.  Heart disease, diabetes, and high blood pressure  High blood pressure causes heart disease and increases the risk of stroke. High blood pressure is more likely to develop in: ? People who have blood pressure in the high end of  the normal range (130-139/85-89 mm Hg). ? People who are overweight or obese. ? People who are African American.  If you are 21-29 years of age, have your blood pressure checked every 3-5 years. If you are 3 years of age or older, have your blood pressure checked every year. You should have your blood pressure measured twice-once when you are at a hospital or clinic, and once when you are not at a hospital or clinic. Record the average of the two measurements. To check your blood pressure when you are not at a hospital or clinic, you can use: ? An automated blood pressure machine at a pharmacy. ? A home blood pressure monitor.  If you are between 17 years and 37 years old, ask your health care provider if you should take aspirin to prevent strokes.  Have regular diabetes screenings. This involves taking a blood sample to check your fasting blood sugar level. ? If you are at a normal weight and have a low risk for diabetes,  have this test once every three years after 25 years of age. ? If you are overweight and have a high risk for diabetes, consider being tested at a younger age or more often. Preventing infection Hepatitis B  If you have a higher risk for hepatitis B, you should be screened for this virus. You are considered at high risk for hepatitis B if: ? You were born in a country where hepatitis B is common. Ask your health care provider which countries are considered high risk. ? Your parents were born in a high-risk country, and you have not been immunized against hepatitis B (hepatitis B vaccine). ? You have HIV or AIDS. ? You use needles to inject street drugs. ? You live with someone who has hepatitis B. ? You have had sex with someone who has hepatitis B. ? You get hemodialysis treatment. ? You take certain medicines for conditions, including cancer, organ transplantation, and autoimmune conditions.  Hepatitis C  Blood testing is recommended for: ? Everyone born from [birth year] through 1963. ? Anyone with known risk factors for hepatitis C.  Sexually transmitted infections (STIs)  You should be screened for sexually transmitted infections (STIs) including gonorrhea and chlamydia if: ? You are sexually active and are younger than 25 years of age. ? You are older than 25 years of age and your health care provider tells you that you are at risk for this type of infection. ? Your sexual activity has changed since you were last screened and you are at an increased risk for chlamydia or gonorrhea. Ask your health care provider if you are at risk.  If you do not have HIV, but are at risk, it may be recommended that you take a prescription medicine daily to prevent HIV infection. This is called pre-exposure prophylaxis (PrEP). You are considered at risk if: ? You are sexually active and do not regularly use condoms or know the HIV status of your partner(s). ? You take drugs by injection. ? You are  sexually active with a partner who has HIV.  Talk with your health care provider about whether you are at high risk of being infected with HIV. If you choose to begin PrEP, you should first be tested for HIV. You should then be tested every 3 months for as long as you are taking PrEP. Pregnancy  If you are premenopausal and you may become pregnant, ask your health care provider about preconception counseling.  If you may become  pregnant, take 400 to 800 micrograms (mcg) of folic acid every day.  If you want to prevent pregnancy, talk to your health care provider about birth control (contraception). Osteoporosis and menopause  Osteoporosis is a disease in which the bones lose minerals and strength with aging. This can result in serious bone fractures. Your risk for osteoporosis can be identified using a bone density scan.  If you are 42 years of age or older, or if you are at risk for osteoporosis and fractures, ask your health care provider if you should be screened.  Ask your health care provider whether you should take a calcium or vitamin D supplement to lower your risk for osteoporosis.  Menopause may have certain physical symptoms and risks.  Hormone replacement therapy may reduce some of these symptoms and risks. Talk to your health care provider about whether hormone replacement therapy is right for you. Follow these instructions at home:  Schedule regular health, dental, and eye exams.  Stay current with your immunizations.  Do not use any tobacco products including cigarettes, chewing tobacco, or electronic cigarettes.  If you are pregnant, do not drink alcohol.  If you are breastfeeding, limit how much and how often you drink alcohol.  Limit alcohol intake to no more than 1 drink per day for nonpregnant women. One drink equals 12 ounces of beer, 5 ounces of wine, or 1 ounces of hard liquor.  Do not use street drugs.  Do not share needles.  Ask your health care  provider for help if you need support or information about quitting drugs.  Tell your health care provider if you often feel depressed.  Tell your health care provider if you have ever been abused or do not feel safe at home. This information is not intended to replace advice given to you by your health care provider. Make sure you discuss any questions you have with your health care provider. Document Released: 06/27/2011 Document Revised: 05/19/2016 Document Reviewed: 09/15/2015 Elsevier Interactive Patient Education  2018 [employer]. HPV (Human Papillomavirus) Vaccine: What You Need to Know 1. Why get vaccinated? HPV vaccine prevents infection with human papillomavirus (HPV) types that are associated with many cancers, including:  cervical cancer in females,  vaginal and vulvar cancers in females,  anal cancer in females and males,  throat cancer in females and males, and  penile cancer in males.  In addition, HPV vaccine prevents infection with HPV types that cause genital warts in both females and males. In the [place] about 12,000 women get cervical cancer every year, and about 4,000 women die from it. HPV vaccine can prevent most of these cases of cervical cancer. Vaccination is not a substitute for cervical cancer screening. This vaccine does not protect against all HPV types that can cause cervical cancer. Women should still get regular Pap tests. HPV infection usually comes from sexual contact, and most people will become infected at some point in their life. About 14 million Americans, including teens, get infected every year. Most infections will go away on their own and not cause serious problems. But thousands of women and men get cancer and other diseases from HPV. 2. HPV vaccine HPV vaccine is approved by FDA and is recommended by CDC for both males and females. It is routinely given at 30 or 25 years of age, but it may be given beginning at age 6 years through age 53  years. Most adolescents 9 through 25 years of age should get HPV vaccine as a  two-dose series with the doses separated by 6-12 months. People who start HPV vaccination at 28 years of age and older should get the vaccine as a three-dose series with the second dose given 1-2 months after the first dose and the third dose given 6 months after the first dose. There are several exceptions to these age recommendations. Your health care provider can give you more information. 3. Some people should not get this vaccine  Anyone who has had a severe (life-threatening) allergic reaction to a dose of HPV vaccine should not get another dose.  Anyone who has a severe (life threatening) allergy to any component of HPV vaccine should not get the vaccine.  Tell your doctor if you have any severe allergies that you know of, including a severe allergy to yeast.  HPV vaccine is not recommended for pregnant women. If you learn that you were pregnant when you were vaccinated, there is no reason to expect any problems for you or your baby. Any woman who learns she was pregnant when she got HPV vaccine is encouraged to contact the manufacturer's registry for HPV vaccination during pregnancy at (732)506-0493. Women who are breastfeeding may be vaccinated.  If you have a mild illness, such as a cold, you can probably get the vaccine today. If you are moderately or severely ill, you should probably wait until you recover. Your doctor can advise you. 4. Risks of a vaccine reaction With any medicine, including vaccines, there is a chance of side effects. These are usually mild and go away on their own, but serious reactions are also possible. Most people who get HPV vaccine do not have any serious problems with it. Mild or moderate problems following HPV vaccine:  Reactions in the arm where the shot was given: ? Soreness (about 9 people in 10) ? Redness or swelling (about 1 person in 3)  Fever: ? Mild (100F) (about 1  person in 10) ? Moderate (102F) (about 1 person in 69)  Other problems: ? Headache (about 1 person in 3) Problems that could happen after any injected vaccine:  People sometimes faint after a medical procedure, including vaccination. Sitting or lying down for about 15 minutes can help prevent fainting, and injuries caused by a fall. Tell your doctor if you feel dizzy, or have vision changes or ringing in the ears.  Some people get severe pain in the shoulder and have difficulty moving the arm where a shot was given. This happens very rarely.  Any medication can cause a severe allergic reaction. Such reactions from a vaccine are very rare, estimated at about 1 in a million doses, and would happen within a few minutes to a few hours after the vaccination. As with any medicine, there is a very remote chance of a vaccine causing a serious injury or death. The safety of vaccines is always being monitored. For more information, visit: http://www.aguilar.org/. 5. What if there is a serious reaction? What should I look for? Look for anything that concerns you, such as signs of a severe allergic reaction, very high fever, or unusual behavior. Signs of a severe allergic reaction can include hives, swelling of the face and throat, difficulty breathing, a fast heartbeat, dizziness, and weakness. These would usually start a few minutes to a few hours after the vaccination. What should I do? If you think it is a severe allergic reaction or other emergency that can't wait, call 9-1-1 or get to the nearest hospital. Otherwise, call your doctor. Afterward, the  reaction should be reported to the Vaccine Adverse Event Reporting System (VAERS). Your doctor should file this report, or you can do it yourself through the VAERS web site at www.vaers.SamedayNews.es, or by calling 765 724 1766. VAERS does not give medical advice. 6. The National Vaccine Injury Compensation Program The [employer] Vaccine Injury  Compensation Program (VICP) is a federal program that was created to compensate people who may have been injured by certain vaccines. Persons who believe they may have been injured by a vaccine can learn about the program and about filing a claim by calling 763-660-1251 or visiting the [place] website at GoldCloset.com.ee. There is a time limit to file a claim for compensation. 7. How can I learn more?  Ask your health care provider. He or she can give you the vaccine package insert or suggest other sources of information.  Call your local or state health department.  Contact the Centers for Disease Control and Prevention (CDC): ? Call 629-412-3566 (1-800-CDC-INFO) or ? Visit CDC's website at http://sweeney-todd.com/ Vaccine Information Statement, HPV Vaccine (11/27/2015) This information is not intended to replace advice given to you by your health care provider. Make sure you discuss any questions you have with your health care provider. Document Released: 07/09/2014 Document Revised: 09/01/2016 Document Reviewed: 09/01/2016 Elsevier Interactive Patient Education  2017 [employer].

## 2018-01-25 NOTE — Progress Notes (Signed)
GYNECOLOGY ANNUAL PHYSICAL EXAM PROGRESS NOTE  Subjective:    Rhonda Davies is a 27 y.o. G86P1001 female who presents for an annual exam.  The patient is sexually active. The patient wears seatbelts: yes. The patient participates in regular exercise: yes (has recently started working out 2-3 times per week). Has the patient ever been transfused or tattooed?: no. The patient reports that there is not domestic violence in her life.   The patient has the following complaints today: 1. Dysuria and malodorous urine for 2 weeks. Denies hematuria or abdominal pain, however she states she does feel like her kidneys hurt. Has not attempted any OTC interventions. Has had a h/o UTI in the past.   Gynecologic History Menarche age: 27 Patient's last menstrual period was 01/07/2018. Contraception: condoms History of STI's: Trichomoniasis and Chlamydia in 2017 Last Pap: 04/2016. Results were: abnormal, ASCUS with HR HPV+. Denies prior h/o abnormal pap smears.    Obstetric History   G1   P1   T1   P0   A0   L1    SAB0   TAB0   Ectopic0   Multiple0   Live Births1     # Outcome Date GA Lbr Len/2nd Weight Sex Delivery Anes PTL Lv  1 Term 10/30/16 [id number] 4 lb 14.7 oz (2.23 kg) F Vag-Spont EPI  LIV     Name: Drab,GIRL Jonet     Apgar1:  2                Apgar5: 7      Past Medical History:  Diagnosis Date  . H/O chlamydia infection 01/27/2016   Chlamydia infection 12/2015, treated.    . H/O trichomoniasis 12/14/2016  . Kidney stone   . Left groin pain   . Personal history UTI   . Pregnancy induced hypertension     Past Surgical History:  Procedure Laterality Date  . CYST REMOVED  2013   IN NECK  . TONSILLECTOMY AND ADENOIDECTOMY  2007    Family History  Problem Relation Age of Onset  . Heart disease Father   . Diabetes Maternal Aunt   . Heart disease Maternal Grandfather   . Diabetes Maternal Grandfather   . Diabetes Paternal Grandmother   . Heart disease Paternal  Grandfather   . Breast cancer Neg Hx   . Colon cancer Neg Hx   . Ovarian cancer Neg Hx     Social History   Socioeconomic History  . Marital status: Single    Spouse name: Not on file  . Number of children: Not on file  . Years of education: Not on file  . Highest education level: Not on file  Social Needs  . Financial resource strain: Not on file  . Food insecurity - worry: Not on file  . Food insecurity - inability: Not on file  . Transportation needs - medical: Not on file  . Transportation needs - non-medical: Not on file  Occupational History  . Occupation: day care worker  Tobacco Use  . Smoking status: Never Smoker  . Smokeless tobacco: Never Used  Substance and Sexual Activity  . Alcohol use: No    Alcohol/week: 0.6 oz    Types: 1 Standard drinks or equivalent per week    Frequency: Never  . Drug use: No  . Sexual activity: Yes    Partners: Male    Birth control/protection: Condom  Other Topics Concern  . Not on file  Social History Narrative  Single.    Works as a Chemical engineer.    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   Enjoys mudding, fishing.     No current outpatient medications on file prior to visit.   No current facility-administered medications on file prior to visit.     Allergies  Allergen Reactions  . Benzonatate Swelling     Review of Systems Constitutional: negative for chills, fatigue, fevers and sweats Eyes: negative for irritation, redness and visual disturbance Ears, nose, mouth, throat, and face: negative for hearing loss, nasal congestion, snoring and tinnitus Respiratory: negative for asthma, cough, sputum Cardiovascular: negative for chest pain, dyspnea, exertional chest pressure/discomfort, irregular heart beat, palpitations and syncope Gastrointestinal: negative for abdominal pain, change in bowel habits, nausea and vomiting Genitourinary: negative for abnormal menstrual periods, genital lesions, sexual problems and vaginal  discharge, and urinary incontinence.  Positive for dysuria and malodorous urine Integument/breast: negative for breast lump, breast tenderness and nipple discharge Hematologic/lymphatic: negative for bleeding and easy bruising Musculoskeletal:negative for back pain and muscle weakness Neurological: negative for dizziness, vertigo and weakness. Positive for headaches (relieved with OTC meds Endocrine: negative for diabetic symptoms including polydipsia, polyuria and skin dryness Allergic/Immunologic: negative for hay fever and urticaria        Objective:  Blood pressure 111/75, pulse (!) 102, height _0  (1.626 m), weight 241 lb 6.4 oz (109.5 kg), last menstrual period 01/07/2018, not currently breastfeeding. Body mass index is 41.44 kg/m.  General Appearance:    Alert, cooperative, no distress, appears stated age, morbidly obese  Head:    Normocephalic, without obvious abnormality, atraumatic  Eyes:    PERRL, conjunctiva/corneas clear, EOM's intact, both eyes  Ears:    Normal external ear canals, both ears  Nose:   Nares normal, septum midline, mucosa normal, no drainage or sinus tenderness  Throat:   Lips, mucosa, and tongue normal; teeth and gums normal  Neck:   Supple, symmetrical, trachea midline, no adenopathy; thyroid: no enlargement/tenderness/nodules; no carotid bruit or JVD  Back:     Symmetric, no curvature, ROM normal, no CVA tenderness  Lungs:     Clear to auscultation bilaterally, respirations unlabored  Chest Wall:    No tenderness or deformity   Heart:    Regular rate and rhythm, S1 and S2 normal, no murmur, rub or gallop  Breast Exam:    No tenderness, masses, or nipple abnormality  Abdomen:     Soft, non-tender, bowel sounds active all four quadrants, no masses, no organomegaly.    Genitalia:    Pelvic:external genitalia normal, vagina without lesions, discharge, or tenderness, rectovaginal septum  normal. Cervix normal in appearance, no cervical motion tenderness, no  adnexal masses or tenderness.  Uterus normal size, shape, mobile, regular contours, nontender.  Rectal:    Normal external sphincter.  No hemorrhoids appreciated. Internal exam not done.   Extremities:   Extremities normal, atraumatic, no cyanosis or edema  Pulses:   2+ and symmetric all extremities  Skin:   Skin color, texture, turgor normal, no rashes or lesions  Lymph nodes:   Cervical, supraclavicular, and axillary nodes normal  Neurologic:   CNII-XII intact, normal strength, sensation and reflexes throughout   .  Labs:  Lab Results  Component Value Date   WBC 5.8 03/20/2017   HGB 12.1 03/20/2017   HCT 36.3 03/20/2017   MCV 78.5 (L) 03/20/2017   PLT 223 03/20/2017    Lab Results  Component Value Date   CREATININE 0.75 03/20/2017   BUN  15 03/20/2017   NA 136 03/20/2017   K 3.6 03/20/2017   CL 105 03/20/2017   CO2 23 03/20/2017    Lab Results  Component Value Date   ALT 20 03/20/2017   AST 26 03/20/2017   ALKPHOS 68 03/20/2017   BILITOT 0.8 03/20/2017    Lab Results  Component Value Date   TSH 1.360 03/28/2016      Results for orders placed or performed in visit on 01/25/18  CBC  Result Value Ref Range   WBC 8.6 3.4 - 10.8 x10E3/uL   RBC 4.65 3.77 - 5.28 x10E6/uL   Hemoglobin 12.3 11.1 - 15.9 g/dL   Hematocrit 37.3 34.0 - 46.6 %   MCV 80 79 - 97 fL   MCH 26.5 (L) 26.6 - 33.0 pg   MCHC 33.0 31.5 - 35.7 g/dL   RDW 14.3 12.3 - 15.4 %   Platelets 310 150 - 379 x10E3/uL  Comp Met (CMET)  Result Value Ref Range   Glucose 83 65 - 99 mg/dL   BUN 13 6 - 20 mg/dL   Creatinine, Ser 1.00 0.57 - 1.00 mg/dL   GFR calc non Af Amer 79 >59 mL/min/1.73   GFR calc Af Amer 91 >59 mL/min/1.73   BUN/Creatinine Ratio 13 9 - 23   Sodium 142 134 - 144 mmol/L   Potassium 4.4 3.5 - 5.2 mmol/L   Chloride 102 96 - 106 mmol/L   CO2 25 20 - 29 mmol/L   Calcium 9.8 8.7 - 10.2 mg/dL   Total Protein 6.9 6.0 - 8.5 g/dL   Albumin 4.7 3.5 - 5.5 g/dL   Globulin, Total 2.2 1.5 -  4.5 g/dL   Albumin/Globulin Ratio 2.1 1.2 - 2.2   Bilirubin Total 0.6 0.0 - 1.2 mg/dL   Alkaline Phosphatase 78 39 - 117 IU/L   AST 14 0 - 40 IU/L   ALT 15 0 - 32 IU/L    Assessment:   Routine gynecologic exam.    Morbid obesity H/o abnormal pap smear (ASCUS HR HPV+) Urinary tract infection  Plan:     Blood tests: CBC with diff and Comprehensive metabolic panel. Breast self exam technique reviewed and patient encouraged to perform self-exam monthly. Contraception: condoms. Discussed healthy lifestyle modifications. Pap smear. Urinalysis. Urine culture and sensitivity.   Declines flu vaccine STD screening doine with pap smear (GC/CT) Discussed HPV vaccine. Given handout.  Follow up in 1 year    Rubie Maid, MD [facility]

## 2018-01-26 ENCOUNTER — Encounter: Payer: Self-pay | Admitting: Obstetrics and Gynecology

## 2018-01-26 DIAGNOSIS — Z8742 Personal history of other diseases of the female genital tract: Secondary | ICD-10-CM | POA: Insufficient documentation

## 2018-01-26 LAB — COMPREHENSIVE METABOLIC PANEL
ALBUMIN: 4.7 g/dL (ref 3.5–5.5)
ALT: 15 IU/L (ref 0–32)
AST: 14 IU/L (ref 0–40)
Albumin/Globulin Ratio: 2.1 (ref 1.2–2.2)
Alkaline Phosphatase: 78 IU/L (ref 39–117)
BILIRUBIN TOTAL: 0.6 mg/dL (ref 0.0–1.2)
BUN / CREAT RATIO: 13 (ref 9–23)
BUN: 13 mg/dL (ref 6–20)
CALCIUM: 9.8 mg/dL (ref 8.7–10.2)
CHLORIDE: 102 mmol/L (ref 96–106)
CO2: 25 mmol/L (ref 20–29)
CREATININE: 1 mg/dL (ref 0.57–1.00)
GFR, EST AFRICAN AMERICAN: 91 mL/min/{1.73_m2} (ref >59)
GFR, EST NON AFRICAN AMERICAN: 79 mL/min/{1.73_m2} (ref >59)
GLUCOSE: 83 mg/dL (ref 65–99)
Globulin, Total: 2.2 g/dL (ref 1.5–4.5)
Potassium: 4.4 mmol/L (ref 3.5–5.2)
Sodium: 142 mmol/L (ref 134–144)
TOTAL PROTEIN: 6.9 g/dL (ref 6.0–8.5)

## 2018-01-26 LAB — CBC
HEMOGLOBIN: 12.3 g/dL (ref 11.1–15.9)
Hematocrit: 37.3 % (ref 34.0–46.6)
MCH: 26.5 pg — AB (ref 26.6–33.0)
MCHC: 33 g/dL (ref 31.5–35.7)
MCV: 80 fL (ref 79–97)
PLATELETS: 310 10*3/uL (ref 150–379)
RBC: 4.65 x10E6/uL (ref 3.77–5.28)
RDW: 14.3 % (ref 12.3–15.4)
WBC: 8.6 10*3/uL (ref 3.4–10.8)

## 2018-01-26 LAB — URINE CULTURE

## 2018-01-28 LAB — PAP IG, CT-NG, RFX HPV ASCU
Chlamydia, Nuc. Acid Amp: NEGATIVE
GONOCOCCUS BY NUCLEIC ACID AMP: NEGATIVE
PAP Smear Comment: 0

## 2018-03-20 ENCOUNTER — Ambulatory Visit (INDEPENDENT_AMBULATORY_CARE_PROVIDER_SITE_OTHER): Payer: Self-pay | Admitting: Family Medicine

## 2018-03-20 ENCOUNTER — Encounter: Payer: Self-pay | Admitting: Family Medicine

## 2018-03-20 VITALS — BP 120/70 | HR 100 | Temp 98.8°F | Resp 16 | Ht 64.0 in | Wt 243.8 lb

## 2018-03-20 DIAGNOSIS — F419 Anxiety disorder, unspecified: Secondary | ICD-10-CM

## 2018-03-20 DIAGNOSIS — F320 Major depressive disorder, single episode, mild: Secondary | ICD-10-CM

## 2018-03-20 DIAGNOSIS — Z6841 Body Mass Index (BMI) 40.0 and over, adult: Secondary | ICD-10-CM

## 2018-03-20 DIAGNOSIS — R51 Headache: Secondary | ICD-10-CM

## 2018-03-20 DIAGNOSIS — R519 Headache, unspecified: Secondary | ICD-10-CM

## 2018-03-20 DIAGNOSIS — Z833 Family history of diabetes mellitus: Secondary | ICD-10-CM

## 2018-03-20 DIAGNOSIS — Z13220 Encounter for screening for lipoid disorders: Secondary | ICD-10-CM

## 2018-03-20 MED ORDER — ESCITALOPRAM OXALATE 10 MG PO TABS: 10.0000 mg | ORAL_TABLET | Freq: Every day | ORAL | 0 refills | Status: DC

## 2018-03-20 NOTE — Patient Instructions (Addendum)
- Take 1/2 tablet daily of Lexapro for 7 days, then Increase to 1 tablet daily.  - Psychology Today Find a Therapist  - Eat breakfast every day.  - Take Aleve every 12 hours as needed for headache. May also take Tylenol as needed.   12 Ways to Curb Anxiety  ?Anxiety is normal human sensation. It is what helped our ancestors survive the pitfalls of the wilderness. Anxiety is defined as experiencing worry or nervousness about an imminent event or something with an uncertain outcome. It is a feeling experienced by most people at some point in their lives. Anxiety can be triggered by a very personal issue, such as the illness of a loved one, or an event of global proportions, such as a refugee crisis. Some of the symptoms of anxiety are:  Feeling restless.  Having a feeling of impending danger.  Increased heart rate.  Rapid breathing. Sweating.  Shaking.  Weakness or feeling tired.  Difficulty concentrating on anything except the current worry.  Insomnia.  Stomach or bowel problems. What can we do about anxiety we may be feeling? There are many techniques to help manage stress and relax. Here are 12 ways you can reduce your anxiety almost immediately: 1. Turn off the constant feed of information. Take a social media sabbatical. Studies have shown that social media directly contributes to social anxiety.  2. Monitor your television viewing habits. Are you watching shows that are also contributing to your anxiety, such as 24-hour news stations? Try watching something else, or better yet, nothing at all. Instead, listen to music, read an inspirational book or practice a hobby. 3. Eat nutritious meals. Also, don't skip meals and keep healthful snacks on hand. Hunger and poor diet contributes to feeling anxious. 4. Sleep. Sleeping on a regular schedule for at least seven to eight hours a night will do wonders for your outlook when you are awake. 5. Exercise. Regular exercise will help rid your body  of that anxious energy and help you get more restful sleep. 6. Try deep (diaphragmatic) breathing. Inhale slowly through your nose for five seconds and exhale through your mouth. 7. Practice acceptance and gratitude. When anxiety hits, accept that there are things out of your control that shouldn't be of immediate concern.  8. Seek out humor. When anxiety strikes, watch a funny video, read jokes or call a friend who makes you laugh. Laughter is healing for our bodies and releases endorphins that are calming. 9. Stay positive. Take the effort to replace negative thoughts with positive ones. Try to see a stressful situation in a positive light. Try to come up with solutions rather than dwelling on the problem. 10. Figure out what triggers your anxiety. Keep a journal and make note of anxious moments and the events surrounding them. This will help you identify triggers you can avoid or even eliminate. 11. Talk to someone. Let a trusted friend, family member or even trained professional know that you are feeling overwhelmed and anxious. Verbalize what you are feeling and why.  12. Volunteer. If your anxiety is triggered by a crisis on a large scale, become an advocate and work to resolve the problem that is causing you unease. Anxiety is often unwelcome and can become overwhelming. If not kept in check, it can become a disorder that could require medical treatment. However, if you take the time to care for yourself and avoid the triggers that make you anxious, you will be able to find moments of relaxation and  clarity that make your life much more enjoyable.

## 2018-03-20 NOTE — Progress Notes (Signed)
Name: Rhonda Davies   MRN: [id number]    DOB: 1991-11-08   Date:03/20/2018       Progress Note  Subjective  Chief Complaint  Chief Complaint  Patient presents with  . Establish Care  . Headache    HPI  PT presents to establish care and to discuss headaches, anxiety, and obesity:  Headaches: These have been present for a couple of months and have occurred about 3 times a week since their onset.  Has had to call out of work due to severity of symptoms.  She denies any aura prior to headache onset. - Typical Treatments: Lay in bed in the dark, try to sleep it off; tylenol does not help, excedrine does not help.  CBD oil has helped sometimes.   - Triggers include fried/fatty foods, caffeine withdrawal has contributed. - Usual symptoms include: Nausea, photo and phono sensitivities, feels generalized weakness, some blurred vision.  Denies vomiting, numbness/tingling, slurred speech, focal extremity weakness  Obesity: Diet Recall: Breakfast is skipped or cereal, lunch is sandwich or leftovers; dinner is brown rice/chicken, pasta along with some vegetables; will have fruit with lunch and dinner.  Snacks include chips, cheese-its, peanuts. Only eats fried foods when out to eat twice a month.  Cut out sodas, still having about 2 sweet teas a day, drinking plenty of water. Exercise: Has an elliptical at the home and wants to start using it regularly.  Anxiety & Depression: She has long family history of anxiety and depression, feels like she has some anxiety and is unsure if this contributes to her headaches. Stressors: Works for [employer] and this is really stressful; boyfriend (and her youngest daughter's father) is bipolar and un-medicated which causes significant stress. Grandmother passed 4 years ago and they were best friends - she has really struggled with anxiety since then; has occasional panic attacks. Support: Dad is very supportive and they are very close; parents are still married.   Brother is moving home from [place] soon and they are also close.  Pertinent History: Dad and brother have cluster headaches; family has history of HTN and she had HTN during her last pregnancy - she was able to come off of her HTN medication (Labetalol) a few months after giving birth.  Dad and paternal aunt have anxiety.  GAD 7 : Generalized Anxiety Score 03/20/2018  Nervous, Anxious, on Edge 2  Control/stop worrying 2  Worry too much - different things 3  Trouble relaxing 2  Restless 1  Easily annoyed or irritable 2  Afraid - awful might happen 3  Total GAD 7 Score 15  Anxiety Difficulty Somewhat difficult    Depression screen [facility] 2/9 03/20/2018 03/20/2018 12/13/2016  Decreased Interest 1 0 1  Down, Depressed, Hopeless 1 0 1  PHQ - 2 Score 2 0 2  Altered sleeping 2 - 0  Tired, decreased energy 2 - 1  Change in appetite 1 - 3  Feeling bad or failure about yourself  1 - 1  Trouble concentrating 0 - 1  Moving slowly or fidgety/restless 2 - 0  Suicidal thoughts 0 - 0  PHQ-9 Score 10 - 8  Difficult doing work/chores Very difficult - -    Patient Active Problem List   Diagnosis Date Noted  . Family history of diabetes mellitus 03/20/2018  . Morbid obesity (HCC) 01/26/2018  . H/O abnormal cervical Papanicolaou smear 01/26/2018  . H/O trichomoniasis 12/14/2016  . ASCUS with positive high risk HPV 05/29/2016  . Morbid obesity with BMI  of 40.0-44.9, adult (HCC) 04/26/2016  . H/O chlamydia infection 01/27/2016  . Dysfunctional uterine bleeding 06/05/2015    Past Surgical History:  Procedure Laterality Date  . CYST REMOVED  2013   IN NECK  . TONSILLECTOMY AND ADENOIDECTOMY  2007    Family History  Problem Relation Age of Onset  . Heart disease Father   . Diabetes Maternal Aunt   . Heart disease Maternal Grandfather   . Diabetes Maternal Grandfather   . Diabetes Paternal Grandmother   . Heart disease Paternal Grandfather   . Anxiety disorder Paternal Aunt   . Bipolar  disorder Paternal Aunt   . Breast cancer Neg Hx   . Colon cancer Neg Hx   . Ovarian cancer Neg Hx     Social History   Socioeconomic History  . Marital status: Single    Spouse name: Not on file  . Number of children: Not on file  . Years of education: Not on file  . Highest education level: Not on file  Occupational History  . Occupation: day care worker  Social Needs  . Financial resource strain: Not on file  . Food insecurity:    Worry: Not on file    Inability: Not on file  . Transportation needs:    Medical: Not on file    Non-medical: Not on file  Tobacco Use  . Smoking status: Never Smoker  . Smokeless tobacco: Never Used  Substance and Sexual Activity  . Alcohol use: No    Alcohol/week: 0.6 oz    Types: 1 Standard drinks or equivalent per week    Frequency: Never  . Drug use: No  . Sexual activity: Yes    Partners: Male    Birth control/protection: Condom  Lifestyle  . Physical activity:    Days per week: Not on file    Minutes per session: Not on file  . Stress: Not on file  Relationships  . Social connections:    Talks on phone: Not on file    Gets together: Not on file    Attends religious service: Not on file    Active member of club or organization: Not on file    Attends meetings of clubs or organizations: Not on file    Relationship status: Not on file  . Intimate partner violence:    Fear of current or ex partner: Not on file    Emotionally abused: Not on file    Physically abused: Not on file    Forced sexual activity: Not on file  Other Topics Concern  . Not on file  Social History Narrative   Single.    Currently pregnant.   Works as a Building surveyorDaycare Teacher.    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   Enjoys mudding, fishing.      Current Outpatient Medications:  .  escitalopram (LEXAPRO) 10 MG tablet, Take 1 tablet (10 mg total) by mouth daily., Disp: 30 tablet, Rfl: 0  Allergies  Allergen Reactions  . Benzonatate Swelling     ROS Constitutional: Negative for fever or weight change.  Respiratory: Negative for cough and shortness of breath.   Cardiovascular: Negative for chest pain or palpitations.  Gastrointestinal: Negative for abdominal pain, no bowel changes.  Musculoskeletal: Negative for gait problem or joint swelling.  Skin: Negative for rash.  Neurological: Negative for dizziness, positive for headache.  No other specific complaints in a complete review of systems (except as listed in HPI above).  Objective  Vitals:  03/20/18 0822  BP: 120/70  Pulse: 100  Resp: 16  Temp: 98.8 F (37.1 C)  TempSrc: Oral  SpO2: 96%  Weight: 243 lb 12.8 oz (110.6 kg)  Height: 5\' 4"  (1.626 m)   Body mass index is 41.85 kg/m.  Physical Exam Constitutional: Patient appears well-developed and well-nourished. No distress.  HENT: Head: Normocephalic and atraumatic. Ears: B TMs ok, no erythema or effusion; Nose: Nose normal. Mouth/Throat: Oropharynx is clear and moist. No oropharyngeal exudate.  Eyes: Conjunctivae and EOM are normal. Pupils are equal, round, and reactive to light. No scleral icterus.  Neck: Normal range of motion. Neck supple. No JVD present. No thyromegaly present.  Cardiovascular: Normal rate, regular rhythm and normal heart sounds.  No murmur heard. No BLE edema. Pulmonary/Chest: Effort normal and breath sounds normal. No respiratory distress. Abdominal: Soft. Bowel sounds are normal, no distension. There is no tenderness. no masses Musculoskeletal: Normal range of motion, no joint effusions. No gross deformities Neurological: she is alert and oriented to person, place, and time. No cranial nerve deficit. Coordination, balance, strength, speech and gait are normal.  Skin: Skin is warm and dry. No rash noted. No erythema.  Psychiatric: Patient has a normal mood and affect. behavior is normal. Judgment and thought content normal.  No results found for this or any previous visit (from the past  72 hour(s)).  PHQ2/9: Depression screen [facility] 2/9 03/20/2018 03/20/2018 12/13/2016  Decreased Interest 1 0 1  Down, Depressed, Hopeless 1 0 1  PHQ - 2 Score 2 0 2  Altered sleeping 2 - 0  Tired, decreased energy 2 - 1  Change in appetite 1 - 3  Feeling bad or failure about yourself  1 - 1  Trouble concentrating 0 - 1  Moving slowly or fidgety/restless 2 - 0  Suicidal thoughts 0 - 0  PHQ-9 Score 10 - 8  Difficult doing work/chores Very difficult - -   Fall Risk: Fall Risk  03/20/2018  Falls in the past year? No   Functional Status Survey: Is the patient deaf or have difficulty hearing?: No Does the patient have difficulty seeing, even when wearing glasses/contacts?: No Does the patient have difficulty concentrating, remembering, or making decisions?: No Does the patient have difficulty walking or climbing stairs?: No Does the patient have difficulty dressing or bathing?: No Does the patient have difficulty doing errands alone such as visiting a doctor's office or shopping?: No  Assessment & Plan  1. Anxiety - escitalopram (LEXAPRO) 10 MG tablet; Take 1 tablet (10 mg total) by mouth daily.  Dispense: 30 tablet; Refill: 0 - CBC - TSH - Recommend improving diet and exercise routine. Discussed obtaining counseling via psychology today find a therapist site.  2. Current mild episode of major depressive disorder without prior episode (HCC) - escitalopram (LEXAPRO) 10 MG tablet; Take 1 tablet (10 mg total) by mouth daily.  Dispense: 30 tablet; Refill: 0 - Recommend improving diet and exercise routine. Discussed obtaining counseling via psychology today find a therapist site.  3. Morbid obesity with BMI of 40.0-44.9, adult (HCC) - CBC - TSH - Hemoglobin A1c - Comprehensive metabolic panel - Discussed importance of 150 minutes of physical activity weekly, eat two servings of fish weekly, eat one serving of tree nuts ( cashews, pistachios, pecans, almonds.Marland Kitchen) every other day, eat 6  servings of fruit/vegetables daily and drink plenty of water and avoid sweet beverages. Recommend not skipping meals, eating protein in the mornings especially.   4. Family history of diabetes  mellitus - Hemoglobin A1c - Comprehensive metabolic panel  6. Nonintractable episodic headache, unspecified headache type - We will work on better control of anxiety/depression and stressors, not skipping meals, reducing stress. - Advised NSAID (Aleve) as first-line therapy along with rest/dark room/cool compresses. - Recommend keeping headache diary; migraine diet handout is provided for patient to begin trying to ID additional triggers/  - Face-to-face time with patient was more than 25 minutes, >50% time spent counseling and coordination of care - Return in about 2 weeks (around 04/03/2018).

## 2018-04-02 ENCOUNTER — Ambulatory Visit: Payer: Self-pay | Admitting: Family Medicine

## 2018-04-03 ENCOUNTER — Ambulatory Visit (INDEPENDENT_AMBULATORY_CARE_PROVIDER_SITE_OTHER): Payer: Self-pay | Admitting: Nurse Practitioner

## 2018-04-03 ENCOUNTER — Encounter: Payer: Self-pay | Admitting: Nurse Practitioner

## 2018-04-03 VITALS — BP 124/78 | HR 93 | Temp 99.2°F | Resp 16 | Ht 64.0 in | Wt 239.5 lb

## 2018-04-03 DIAGNOSIS — F4321 Adjustment disorder with depressed mood: Secondary | ICD-10-CM

## 2018-04-03 DIAGNOSIS — F419 Anxiety disorder, unspecified: Secondary | ICD-10-CM

## 2018-04-03 DIAGNOSIS — Z6841 Body Mass Index (BMI) 40.0 and over, adult: Secondary | ICD-10-CM

## 2018-04-03 DIAGNOSIS — Z13220 Encounter for screening for lipoid disorders: Secondary | ICD-10-CM | POA: Diagnosis not present

## 2018-04-03 DIAGNOSIS — F4541 Pain disorder exclusively related to psychological factors: Secondary | ICD-10-CM

## 2018-04-03 DIAGNOSIS — Z833 Family history of diabetes mellitus: Secondary | ICD-10-CM | POA: Diagnosis not present

## 2018-04-03 NOTE — Patient Instructions (Addendum)
If you have not heard anything from my staff in a week about any orders/referrals/studies from today, please contact us here to follow-up [phone]  

## 2018-04-03 NOTE — Progress Notes (Addendum)
Name: Rhonda Davies   MRN: [id number]    DOB: 06/21/1991   Date:04/03/2018       Progress Note  Subjective  Chief Complaint  Chief Complaint  Patient presents with  . Follow-up    medication check    HPI  Patient of [name] presents for follow-up today.  Her last visit was on 3/26 was an establish care visit.  Patient discussed anxiety and depression as well as obesity.  Patient was started on Lexapro 10 mg, provider discussed diet and exercise.  Discussed counseling.  Appointment today is follow-up to see how medications, diet, exercise, counseling has been.  Labs were ordered last visit will ensure they are obtained today.   Patient states she doesn't feel different on the lexapro but boyfriend noted that she was chipper. Sleep is the same- she take the medicine at night because it make her drowsy. Endorses nausea first thing in the morning but is relieved with something to eat. Patient states she has reached out to some counselors and is waiting to get a call back. Her dad gets free family counseling services because is a Emergency planning/management officerpolice officer. Denies SI/HI  Headaches still presents- states half the days she has had headaches. She takes tylenol instead of Excedrin and it works but not as fast. States gave up sodas- does drink 1-2 cups of sweet tea and 7-8 cups of water a day. She started eating breakfast- toast or waffles (whatever her daughter eats), eats more vegetables. Exercise maybe 2 days a week for about 30 minutes.   GAD 7 : Generalized Anxiety Score 04/03/2018 03/20/2018  Nervous, Anxious, on Edge 1 2  Control/stop worrying 1 2  Worry too much - different things 3 3  Trouble relaxing 2 2  Restless 2 1  Easily annoyed or irritable 2 2  Afraid - awful might happen 3 3  Total GAD 7 Score 14 15  Anxiety Difficulty Somewhat difficult Somewhat difficult    Depression screen PHQ 2/9 04/03/2018  Decreased Interest 1  Down, Depressed, Hopeless 2  PHQ - 2 Score 3  Altered sleeping 1   Tired, decreased energy 2  Change in appetite 2  Feeling bad or failure about yourself  2  Trouble concentrating 1  Moving slowly or fidgety/restless 1  Suicidal thoughts 1  PHQ-9 Score 13  Difficult doing work/chores Very difficult    Patient Active Problem List   Diagnosis Date Noted  . Family history of diabetes mellitus 03/20/2018  . H/O abnormal cervical Papanicolaou smear 01/26/2018  . H/O trichomoniasis 12/14/2016  . ASCUS with positive high risk HPV 05/29/2016  . Morbid obesity with BMI of 40.0-44.9, adult (HCC) 04/26/2016  . H/O chlamydia infection 01/27/2016  . Dysfunctional uterine bleeding 06/05/2015    Past Medical History:  Diagnosis Date  . H/O chlamydia infection 01/27/2016   Chlamydia infection 12/2015, treated.    . H/O trichomoniasis 12/14/2016  . Kidney stone   . Left groin pain   . Personal history UTI   . Pregnancy induced hypertension     Past Surgical History:  Procedure Laterality Date  . CYST REMOVED  2013   IN NECK  . TONSILLECTOMY AND ADENOIDECTOMY  2007    Social History   Tobacco Use  . Smoking status: Never Smoker  . Smokeless tobacco: Never Used  Substance Use Topics  . Alcohol use: No    Alcohol/week: 0.6 oz    Types: 1 Standard drinks or equivalent per week    Frequency:  Never     Current Outpatient Medications:  .  escitalopram (LEXAPRO) 10 MG tablet, Take 1 tablet (10 mg total) by mouth daily., Disp: 30 tablet, Rfl: 0  Allergies  Allergen Reactions  . Benzonatate Swelling    ROS   No other specific complaints in a complete review of systems (except as listed in HPI above).  Objective  Vitals:   04/03/18 0904  BP: 124/78  Pulse: 93  Resp: 16  Temp: 99.2 F (37.3 C)  TempSrc: Oral  SpO2: 96%  Weight: 239 lb 8 oz (108.6 kg)  Height: 5\' 4"  (1.626 m)    Body mass index is 41.11 kg/m.  Nursing Note and Vital Signs reviewed.  Physical Exam  Constitutional: Patient appears well-developed and  well-nourished. Obese  No distress.  HEENT: head atraumatic, normocephalic, conjunctiva clear no nasal discharge Cardiovascular: Normal rate, skin warm and dry.  Pulmonary/Chest: Effort normal. No respiratory distress or retractions. Psychiatric: Patient has a normal mood and affect. behavior is normal. Judgment and thought content normal.  No results found for this or any previous visit (from the past 72 hour(s)).  Assessment & Plan  Patient presents for 2-week follow-up after being started on Lexapro during establish of care visit.  Patient states she does not note a change in her anxiety and depression but her boyfriend has noted that she is more chipper.  Patient has been working towards a healthier lifestyle.  She is cut out sodas increased vegetable intake and started eating breakfast.  Patient is still having headaches, these are relieved by Tylenol but not as quickly as when she was taking Excedrin.  Patient encouraged to continue with Lexapro.  Informed it may take 4-6 weeks for her to notice effects.  She has reached out to counseling services that are free provided through her father's job, she is waiting to hear back.  Patient encouraged to follow-up with counseling.  Patient encouraged to drink more water and continue healthy lifestyle changes, increase activity.  We will follow-up in 1 month to go over lab work drawn this morning, as well as assess anxiety and depression and lifestyle changes. 1. Anxiety  2. Situational depression  3. Stress headaches  4. Morbid obesity with BMI of 40.0-44.9, adult (HCC)    -Red flags and when to present for emergency care or RTC including fever >101.30F, chest pain, shortness of breath, new/worsening/un-resolving symptoms, SI/HI reviewed with patient at time of visit. Follow up and care instructions discussed and provided in AVS.   ---------------------------------------- I have reviewed this encounter including the documentation in this note  and/or discussed this patient with the provider, Sharyon Cable DNP AGNP-C. I am certifying that I agree with the content of this note as supervising physician. Baruch Gouty, MD [facility] 04/03/2018, 1:25 PM

## 2018-04-04 LAB — TSH: TSH: 1.06 u[IU]/mL (ref 0.450–4.500)

## 2018-04-04 LAB — COMPREHENSIVE METABOLIC PANEL
A/G RATIO: 2.2 (ref 1.2–2.2)
ALT: 13 IU/L (ref 0–32)
AST: 14 IU/L (ref 0–40)
Albumin: 4.6 g/dL (ref 3.5–5.5)
Alkaline Phosphatase: 72 IU/L (ref 39–117)
BILIRUBIN TOTAL: 0.8 mg/dL (ref 0.0–1.2)
BUN/Creatinine Ratio: 11 (ref 9–23)
BUN: 9 mg/dL (ref 6–20)
CALCIUM: 8.6 mg/dL — AB (ref 8.7–10.2)
CO2: 22 mmol/L (ref 20–29)
Chloride: 107 mmol/L — ABNORMAL HIGH (ref 96–106)
Creatinine, Ser: 0.79 mg/dL (ref 0.57–1.00)
GFR, EST AFRICAN AMERICAN: 121 mL/min/{1.73_m2} (ref >59)
GFR, EST NON AFRICAN AMERICAN: 105 mL/min/{1.73_m2} (ref >59)
GLOBULIN, TOTAL: 2.1 g/dL (ref 1.5–4.5)
Glucose: 108 mg/dL — ABNORMAL HIGH (ref 65–99)
POTASSIUM: 4.4 mmol/L (ref 3.5–5.2)
SODIUM: 141 mmol/L (ref 134–144)
TOTAL PROTEIN: 6.7 g/dL (ref 6.0–8.5)

## 2018-04-04 LAB — LIPID PANEL
CHOL/HDL RATIO: 3 ratio (ref 0.0–4.4)
Cholesterol, Total: 143 mg/dL (ref 100–199)
HDL: 48 mg/dL (ref >39)
LDL CALC: 82 mg/dL (ref 0–99)
TRIGLYCERIDES: 64 mg/dL (ref 0–149)
VLDL Cholesterol Cal: 13 mg/dL (ref 5–40)

## 2018-04-04 LAB — CBC
HEMOGLOBIN: 12.6 g/dL (ref 11.1–15.9)
Hematocrit: 35.8 % (ref 34.0–46.6)
MCH: 27.6 pg (ref 26.6–33.0)
MCHC: 35.2 g/dL (ref 31.5–35.7)
MCV: 79 fL (ref 79–97)
PLATELETS: 300 10*3/uL (ref 150–379)
RBC: 4.56 x10E6/uL (ref 3.77–5.28)
RDW: 14.4 % (ref 12.3–15.4)
WBC: 6.4 10*3/uL (ref 3.4–10.8)

## 2018-04-04 LAB — HEMOGLOBIN A1C
Est. average glucose Bld gHb Est-mCnc: 111 mg/dL
Hgb A1c MFr Bld: 5.5 % (ref 4.8–5.6)

## 2018-04-12 ENCOUNTER — Other Ambulatory Visit: Payer: Self-pay | Admitting: Nurse Practitioner

## 2018-04-12 ENCOUNTER — Telehealth: Payer: Self-pay | Admitting: Family Medicine

## 2018-04-12 DIAGNOSIS — F419 Anxiety disorder, unspecified: Secondary | ICD-10-CM

## 2018-04-12 DIAGNOSIS — F320 Major depressive disorder, single episode, mild: Secondary | ICD-10-CM

## 2018-04-12 MED ORDER — ESCITALOPRAM OXALATE 10 MG PO TABS: 10.0000 mg | ORAL_TABLET | Freq: Every day | ORAL | 3 refills | Status: DC

## 2018-04-12 NOTE — Telephone Encounter (Signed)
Copied from CRM [id number]. Topic: Quick Communication - Rx Refill/Question >> Apr 12, 2018  8:22 AM Maia Pettiesrtiz, Kristie S wrote: Medication: escitalopram (LEXAPRO) 10 MG tablet - 1 week left Has the patient contacted their pharmacy? No - no refills - had appt 04/03/18 w/Poulose NP Preferred Pharmacy (with phone number or street name): [id number] - [place], [place] - [address] [phone] (Phone) [phone] (Fax)

## 2018-04-12 NOTE — Telephone Encounter (Signed)
Message sent to provider for refills

## 2018-04-27 NOTE — Telephone Encounter (Signed)
Pt. Requesting Escitalopram Rx be sent to [facility] on [address]. and [place], in [place].

## 2018-04-27 NOTE — Telephone Encounter (Signed)
Pt called back in to follow up on refill request. Confirmed pharmacy that Rx was sent to pharmacy, pt states that it should have went to  [id number] - [place], [place] - [address] AT [facility] [address] [phone] (Phone) [phone] (Fax     Please assist further.

## 2018-05-07 ENCOUNTER — Ambulatory Visit (INDEPENDENT_AMBULATORY_CARE_PROVIDER_SITE_OTHER): Payer: Self-pay | Admitting: Nurse Practitioner

## 2018-05-07 ENCOUNTER — Encounter: Payer: Self-pay | Admitting: Nurse Practitioner

## 2018-05-07 VITALS — BP 120/84 | HR 90 | Temp 98.9°F | Resp 16 | Ht 64.0 in | Wt 243.7 lb

## 2018-05-07 DIAGNOSIS — F419 Anxiety disorder, unspecified: Secondary | ICD-10-CM

## 2018-05-07 DIAGNOSIS — F4541 Pain disorder exclusively related to psychological factors: Secondary | ICD-10-CM

## 2018-05-07 DIAGNOSIS — F4321 Adjustment disorder with depressed mood: Secondary | ICD-10-CM

## 2018-05-07 NOTE — Patient Instructions (Signed)
-   Make appointment for counseling session - Continue daily medication - Follow-up in 5 weeks to see how medication is working.   12 Ways to Curb Anxiety  ?Anxiety is normal human sensation. It is what helped our ancestors survive the pitfalls of the wilderness. Anxiety is defined as experiencing worry or nervousness about an imminent event or something with an uncertain outcome. It is a feeling experienced by most people at some point in their lives. Anxiety can be triggered by a very personal issue, such as the illness of a loved one, or an event of global proportions, such as a refugee crisis. Some of the symptoms of anxiety are:  Feeling restless.  Having a feeling of impending danger.  Increased heart rate.  Rapid breathing. Sweating.  Shaking.  Weakness or feeling tired.  Difficulty concentrating on anything except the current worry.  Insomnia.  Stomach or bowel problems. What can we do about anxiety we may be feeling? There are many techniques to help manage stress and relax. Here are 12 ways you can reduce your anxiety almost immediately: 1. Turn off the constant feed of information. Take a social media sabbatical. Studies have shown that social media directly contributes to social anxiety.  2. Monitor your television viewing habits. Are you watching shows that are also contributing to your anxiety, such as 24-hour news stations? Try watching something else, or better yet, nothing at all. Instead, listen to music, read an inspirational book or practice a hobby. 3. Eat nutritious meals. Also, don't skip meals and keep healthful snacks on hand. Hunger and poor diet contributes to feeling anxious. 4. Sleep. Sleeping on a regular schedule for at least seven to eight hours a night will do wonders for your outlook when you are awake. 5. Exercise. Regular exercise will help rid your body of that anxious energy and help you get more restful sleep. 6. Try deep (diaphragmatic) breathing. Inhale  slowly through your nose for five seconds and exhale through your mouth. 7. Practice acceptance and gratitude. When anxiety hits, accept that there are things out of your control that shouldn't be of immediate concern.  8. Seek out humor. When anxiety strikes, watch a funny video, read jokes or call a friend who makes you laugh. Laughter is healing for our bodies and releases endorphins that are calming. 9. Stay positive. Take the effort to replace negative thoughts with positive ones. Try to see a stressful situation in a positive light. Try to come up with solutions rather than dwelling on the problem. 10. Figure out what triggers your anxiety. Keep a journal and make note of anxious moments and the events surrounding them. This will help you identify triggers you can avoid or even eliminate. 11. Talk to someone. Let a trusted friend, family member or even trained professional know that you are feeling overwhelmed and anxious. Verbalize what you are feeling and why.  12. Volunteer. If your anxiety is triggered by a crisis on a large scale, become an advocate and work to resolve the problem that is causing you unease. Anxiety is often unwelcome and can become overwhelming. If not kept in check, it can become a disorder that could require medical treatment. However, if you take the time to care for yourself and avoid the triggers that make you anxious, you will be able to find moments of relaxation and clarity that make your life much more enjoyable.

## 2018-05-07 NOTE — Progress Notes (Signed)
Name: Rhonda Davies   MRN: [id number]    DOB: [birth year]   Date:05/07/2018       Progress Note  Subjective  Chief Complaint  Chief Complaint  Patient presents with  . Depression    medication check, was off meds for 2 weeks, but while on it could tell a difference    HPI  Depression and anxiety Patient was off of medication for 2 weeks due to being sent to wrong pharmacy, states family noticed significant difference off the medications- moody, easy to snap and headaches returned. Patient re-started medications on Friday. Has identified a counselor but has not made an appointment. Denies panic attacks, denies SI/HI  GAD 7 : Generalized Anxiety Score 05/07/2018 04/03/2018 03/20/2018  Nervous, Anxious, on Edge Control/stop worrying Worry too much - different things Trouble relaxing Restless Easily annoyed or irritable Afraid - awful might happen Total GAD 7 Score Anxiety Difficulty Somewhat difficult Somewhat difficult Somewhat difficult    Depression screen [facility] 2/9 05/07/2018 05/07/2018 04/03/2018  Decreased Interest - 3 1  Down, Depressed, Hopeless - 3 2  PHQ - 2 Score - 6 3  Altered sleeping - 3 1  Tired, decreased energy - 3 2  Change in appetite - 3 2  Feeling bad or failure about yourself  - 3 2  Trouble concentrating - 3 1  Moving slowly or fidgety/restless - 3 1  Suicidal thoughts 0 0 1  PHQ-9 Score - 24 13  Difficult doing work/chores - Very difficult Very difficult    Patient Active Problem List   Diagnosis Date Noted  . Anxiety 04/03/2018  . Situational depression 04/03/2018  . Stress headaches 04/03/2018  . Family history of diabetes mellitus 03/20/2018  . H/O trichomoniasis 12/14/2016  . ASCUS with positive high risk HPV 05/29/2016  . Morbid obesity with BMI of 40.0-44.9, adult (HCC) 04/26/2016  . H/O chlamydia infection 01/27/2016    Past Medical History:  Diagnosis Date  . H/O chlamydia  infection 01/27/2016   Chlamydia infection 12/2015, treated.    . H/O trichomoniasis 12/14/2016  . Kidney stone   . Left groin pain   . Personal history UTI   . Pregnancy induced hypertension     Past Surgical History:  Procedure Laterality Date  . CYST REMOVED  2013   IN NECK  . TONSILLECTOMY AND ADENOIDECTOMY  2007    Social History   Tobacco Use  . Smoking status: Never Smoker  . Smokeless tobacco: Never Used  Substance Use Topics  . Alcohol use: No    Alcohol/week: 0.6 oz    Types: 1 Standard drinks or equivalent per week    Frequency: Never     Current Outpatient Medications:  .  escitalopram (LEXAPRO) 10 MG tablet, Take 1 tablet (10 mg total) by mouth daily., Disp: 30 tablet, Rfl: 3  Allergies  Allergen Reactions  . Benzonatate Swelling    ROS  No other specific complaints in a complete review of systems (except as listed in HPI above).  Objective  Vitals:   05/07/18 0856  BP: 120/84  Pulse: 90  Resp: 16  Temp: 98.9 F (37.2 C)  TempSrc: Oral  SpO2: 96%  Weight: 243 lb 11.2 oz (110.5 kg)  Height:  (1.626 m)     Body mass index is 41.83 kg/m.  Nursing Note and Vital Signs reviewed.  Physical Exam  Constitutional: Patient appears well-developed and well-nourished. Obese  No distress.  Cardiovascular: Normal rate, regular rhythm, S1/S2 present.  No murmur or rub heard.  Pulmonary/Chest: Effort normal and breath sounds clear. No respiratory distress or retractions. Psychiatric: Patient has a normal mood and affect. behavior is normal. Judgment and thought content normal.  No results found for this or any previous visit (from the past 72 hour(s)).  Assessment & Plan  1. Anxiety Continue medications, discussed counseling and non-pharm tips   2. Situational depression Continue medications, discussed counseling and non-pharm tips   3. Stress headaches Continue medications, discussed counseling and non-pharm tips, sleep hygiene   Follow  up in 5 weeks when medications are better working and has been to counseling to assess dose change needed.   -Red flags and when to present for emergency care or RTC including fever >101.74F, chest pain, shortness of breath, new/worsening/un-resolving symptoms,  reviewed with patient at time of visit. Follow up and care instructions discussed and provided in AVS.

## 2018-06-11 ENCOUNTER — Ambulatory Visit: Payer: Self-pay | Admitting: Nurse Practitioner

## 2018-06-12 ENCOUNTER — Ambulatory Visit (INDEPENDENT_AMBULATORY_CARE_PROVIDER_SITE_OTHER): Payer: Self-pay | Admitting: Nurse Practitioner

## 2018-06-12 ENCOUNTER — Encounter: Payer: Self-pay | Admitting: Nurse Practitioner

## 2018-06-12 VITALS — BP 122/70 | HR 92 | Temp 99.2°F | Resp 16 | Ht 64.0 in | Wt 245.0 lb

## 2018-06-12 DIAGNOSIS — F419 Anxiety disorder, unspecified: Secondary | ICD-10-CM

## 2018-06-12 DIAGNOSIS — N910 Primary amenorrhea: Secondary | ICD-10-CM

## 2018-06-12 DIAGNOSIS — Z3201 Encounter for pregnancy test, result positive: Secondary | ICD-10-CM

## 2018-06-12 DIAGNOSIS — F320 Major depressive disorder, single episode, mild: Secondary | ICD-10-CM

## 2018-06-12 LAB — POCT URINE PREGNANCY: Preg Test, Ur: POSITIVE — AB

## 2018-06-12 MED ORDER — PRENATA 29-1 MG PO CHEW: 1.0000 | CHEWABLE_TABLET | Freq: Every day | ORAL | 2 refills | Status: DC

## 2018-06-12 MED ORDER — ESCITALOPRAM OXALATE 5 MG PO TABS: 5.0000 mg | ORAL_TABLET | Freq: Every day | ORAL | 0 refills | Status: DC

## 2018-06-12 NOTE — Patient Instructions (Signed)
-   we are going to cut your lexapro in half for one week. (taking 5mg  a day) - then go to no lexapro the following week  - Start taking prenatal vitamins daily as soon as possible - Follow up  With OB- I sent an urgent referral over but you can call them as well to schedule an appointment.

## 2018-06-12 NOTE — Progress Notes (Addendum)
Name: Rhonda Davies   MRN: [id number]    DOB: 05-25-1991   Date:06/12/2018       Progress Note  Subjective  Chief Complaint  Chief Complaint  Patient presents with  . Depression    follow up    HPI  Depression and anxiety Patient states other people are noticing a change but patient doesn't seem to feel it. States she feels okay. Patient hasn't met with counseling. Scheduled appointment in august but if there is any early openings they will call. Patient has never had suicidal thoughts.    Depression screen [facility] 2/9 06/12/2018 05/07/2018 05/07/2018 04/03/2018 03/20/2018  Decreased Interest 1 - _0 Down, Depressed, Hopeless 2 - _1 PHQ - 2 Score 3 - _2 Altered sleeping 3 - _3 Tired, decreased energy 2 - _4 Change in appetite 2 - _5 Feeling bad or failure about yourself  2 - _6 Trouble concentrating 1 - 3 1 0  Moving slowly or fidgety/restless 0 - _7 Suicidal thoughts 0 0 0 1 0  PHQ-9 Score 13 - _8 Difficult doing work/chores Somewhat difficult - Very difficult Very difficult Very difficult   GAD 7 : Generalized Anxiety Score 06/12/2018 05/07/2018 04/03/2018 03/20/2018  Nervous, Anxious, on Edge _9 Control/stop worrying _10 Worry too much - different things _11 Trouble relaxing _12 Restless _13 Easily annoyed or irritable _14 Afraid - awful might happen _15 Total GAD 7 Score _16 Anxiety Difficulty - Somewhat difficult Somewhat difficult Somewhat difficult   Pt. Endorses possibility of pregnancy had unprotected sex last month and period was due Thursday. Pt endorses back cramp and migraine that she normally gets with period. Denies nausea.     Patient Active Problem List   Diagnosis Date Noted  . Anxiety 04/03/2018  . Situational depression 04/03/2018  . Stress headaches 04/03/2018  . Family history of diabetes mellitus 03/20/2018  . H/O trichomoniasis 12/14/2016  . ASCUS with positive high  risk HPV 05/29/2016  . Morbid obesity with BMI of 40.0-44.9, adult ([place]) 04/26/2016  . H/O chlamydia infection 01/27/2016    Past Medical History:  Diagnosis Date  . H/O chlamydia infection 01/27/2016   Chlamydia infection 12/2015, treated.    . H/O trichomoniasis 12/14/2016  . Kidney stone   . Left groin pain   . Personal history UTI   . Pregnancy induced hypertension     Past Surgical History:  Procedure Laterality Date  . CYST REMOVED  2013   IN NECK  . TONSILLECTOMY AND ADENOIDECTOMY  2007    Social History   Tobacco Use  . Smoking status: Never Smoker  . Smokeless tobacco: Never Used  Substance Use Topics  . Alcohol use: No    Alcohol/week: 0.6 oz    Types: 1 Standard drinks or equivalent per week    Frequency: Never     Current Outpatient Medications:  .  escitalopram (LEXAPRO) 10 MG tablet, Take 1 tablet (10 mg total) by mouth daily., Disp: 30 tablet, Rfl: 3  Allergies  Allergen Reactions  . Benzonatate Swelling    ROS   Constitutional: Negative for fever Endorses weight gain.  Respiratory: Negative for cough and shortness of breath.  Cardiovascular: Negative for chest pain or palpitations.  Gastrointestinal: Negative for abdominal pain, no bowel changes.  Musculoskeletal: Negative for gait problem or joint swelling.  Skin: Negative for rash.  Neurological: Negative for dizziness or headache.  No other specific complaints in a complete review of systems (except as listed in HPI above).  Objective  Vitals:   06/12/18 0834  BP: 122/70  Pulse: 92  Resp: 16  Temp: 99.2 F (37.3 C)  TempSrc: Oral  SpO2: 96%  Weight: 245 lb (111.1 kg)  Height: _0  (1.626 m)     Body mass index is 42.05 kg/m.  Nursing Note and Vital Signs reviewed.  Physical Exam  Constitutional: Patient appears well-developed and well-nourished. Obese  No distress.  Cardiovascular: Normal rate, regular rhythm, S1/S2 present.  No murmur or rub heard.  Pulmonary/Chest:  Effort normal and breath sounds clear. No respiratory distress or retractions. Abdominal: Soft and non-tender, bowel sounds present  Psychiatric: Patient has a normal mood and affect. behavior is normal. Judgment and thought content normal.  No results found for this or any previous visit (from the past 72 hour(s)).  Assessment & Plan  1. Current mild episode of major depressive disorder without prior episode ([place]) - we are going to cut your lexapro in half for one week. (taking 48m a day) - then go to no lexapro the following week - escitalopram (LEXAPRO) 5 MG tablet; Take 1 tablet (5 mg total) by mouth daily.  Dispense: 7 tablet; Refill: 0  2. Anxiety - we are going to cut your lexapro in half for one week. (taking 524ma day) - then go to no lexapro the following week - escitalopram (LEXAPRO) 5 MG tablet; Take 1 tablet (5 mg total) by mouth daily.  Dispense: 7 tablet; Refill: 0  3. Delayed period - POCT urine pregnancy  4. Positive pregnancy test - Start taking prenatal vitamins daily as soon as possible - Follow up  With OB- I sent an urgent referral over but you can call them as well to schedule an appointment.  - Prenatal w/o A Vit-Fe Fum-FA (PRENATAL VITAMIN W/FE, FA) 29-1 MG CHEW; Chew 1 tablet by mouth daily.  Dispense: 90 tablet; Refill: 2 - Ambulatory referral to Obstetrics / Gynecology     -Red flags and when to present for emergency care or RTC including fever >101.62F, chest pain, shortness of breath, new/worsening/un-resolving symptoms,  reviewed with patient at time of visit. Follow up and care instructions discussed and provided in AVS.  -------------------------------------- I have reviewed this encounter including the documentation in this note and/or discussed this patient with the provider, ElSuezanne CheshireNP AGNP-C. I am certifying that I agree with the content of this note as supervising physician. MeEnid DerryMD[facility]faxroup 06/12/2018, 5:11 PM

## 2018-06-14 ENCOUNTER — Telehealth: Payer: Self-pay | Admitting: Obstetrics and Gynecology

## 2018-06-14 NOTE — Telephone Encounter (Signed)
Patient states PCP took her off Lexapro due to [facility] and being [redacted] weeks pregnant.  Patient is requesting a prescription for an anxiety medication that is safe during pregnancy.  [facility] [address] [place]  Best call back number is [phone], please advise, thanks.

## 2018-06-14 NOTE — Telephone Encounter (Signed)
Pt was [facility] and [facility] and that a message was sent to her for advise and when [facility] pt would be informed. Pt stated that she understood.

## 2018-06-15 MED ORDER — SERTRALINE HCL 50 MG PO TABS: 50.0000 mg | ORAL_TABLET | Freq: Every day | ORAL | 6 refills | Status: DC

## 2018-06-15 NOTE — Telephone Encounter (Signed)
Pt was called no answer LM via voicemail and was informed that [facility] her a safe medication for her to take.

## 2018-06-15 NOTE — Telephone Encounter (Signed)
Called pt and informed her that Zoloft 50 mg had been sent to her pharmacy and she could picked it up this evening.

## 2018-06-15 NOTE — Addendum Note (Signed)
Addended by: Silvano BilisHAMPTON, Creta Dorame L on: 06/15/2018 04:42 PM   Modules accepted: Orders

## 2018-07-09 ENCOUNTER — Ambulatory Visit (INDEPENDENT_AMBULATORY_CARE_PROVIDER_SITE_OTHER): Payer: Self-pay | Admitting: Obstetrics and Gynecology

## 2018-07-09 VITALS — BP 123/74 | HR 114 | Ht 64.0 in | Wt 238.3 lb

## 2018-07-09 DIAGNOSIS — N926 Irregular menstruation, unspecified: Secondary | ICD-10-CM | POA: Diagnosis not present

## 2018-07-09 NOTE — Progress Notes (Signed)
Klynn Lindell NoeNycole Mckowen presents for NOB nurse interview visit. [facility]. G-2 .  P-1    . Pregnancy education material explained and given. _No cats in the home. NOB labs ordered. (TSH/HbgA1c due to Increased BMI), . HIV labs and Drug screen were explained optional and she did not decline. Drug screen ordered. PNV encouraged. Genetic screening options discussed. Genetic testing: Pt works for [employer] will get [employer] done. Pt. To follow up with provider in _2_ weeks for NOB physical.  All questions answered.

## 2018-07-10 LAB — URINALYSIS, ROUTINE W REFLEX MICROSCOPIC
Bilirubin, UA: NEGATIVE
GLUCOSE, UA: NEGATIVE
NITRITE UA: NEGATIVE
RBC, UA: NEGATIVE
SPEC GRAV UA: 1.024 (ref 1.005–1.030)
UUROB: 1 mg/dL (ref 0.2–1.0)
pH, UA: 7 (ref 5.0–7.5)

## 2018-07-10 LAB — HEMOGLOBIN A1C
Est. average glucose Bld gHb Est-mCnc: 100 mg/dL
Hgb A1c MFr Bld: 5.1 % (ref 4.8–5.6)

## 2018-07-10 LAB — CBC WITH DIFFERENTIAL/PLATELET
BASOS: 0 %
Basophils Absolute: 0 10*3/uL (ref 0.0–0.2)
EOS (ABSOLUTE): 0 10*3/uL (ref 0.0–0.4)
EOS: 0 %
HEMATOCRIT: 36.2 % (ref 34.0–46.6)
Hemoglobin: 11.9 g/dL (ref 11.1–15.9)
IMMATURE GRANS (ABS): 0 10*3/uL (ref 0.0–0.1)
IMMATURE GRANULOCYTES: 0 %
Lymphocytes Absolute: 1.3 10*3/uL (ref 0.7–3.1)
Lymphs: 16 %
MCH: 27 pg (ref 26.6–33.0)
MCHC: 32.9 g/dL (ref 31.5–35.7)
MCV: 82 fL (ref 79–97)
MONOS ABS: 0.4 10*3/uL (ref 0.1–0.9)
Monocytes: 5 %
NEUTROS ABS: 6.8 10*3/uL (ref 1.4–7.0)
NEUTROS PCT: 79 %
Platelets: 283 10*3/uL (ref 150–450)
RBC: 4.41 x10E6/uL (ref 3.77–5.28)
RDW: 14.7 % (ref 12.3–15.4)
WBC: 8.6 10*3/uL (ref 3.4–10.8)

## 2018-07-10 LAB — MICROSCOPIC EXAMINATION: Casts: NONE SEEN /lpf

## 2018-07-10 LAB — HEPATITIS B SURFACE ANTIGEN: Hepatitis B Surface Ag: NEGATIVE

## 2018-07-10 LAB — GC/CHLAMYDIA PROBE AMP
CHLAMYDIA, DNA PROBE: NEGATIVE
Neisseria gonorrhoeae by PCR: NEGATIVE

## 2018-07-10 LAB — RPR: RPR: NONREACTIVE

## 2018-07-10 LAB — VARICELLA ZOSTER ANTIBODY, IGG: Varicella zoster IgG: <135 index — ABNORMAL LOW (ref >165)

## 2018-07-10 LAB — TSH: TSH: 2.36 u[IU]/mL (ref 0.450–4.500)

## 2018-07-10 LAB — ABO AND RH: Rh Factor: NEGATIVE

## 2018-07-10 LAB — HIV ANTIBODY (ROUTINE TESTING W REFLEX): HIV Screen 4th Generation wRfx: NONREACTIVE

## 2018-07-10 LAB — ANTIBODY SCREEN: Antibody Screen: NEGATIVE

## 2018-07-10 LAB — RUBELLA SCREEN: RUBELLA: 1.09 {index} (ref >0.99)

## 2018-07-10 NOTE — Progress Notes (Signed)
I have reviewed the record and concur with patient management and plan.  Kassady Laboy, MD [facility]     

## 2018-07-11 LAB — MONITOR DRUG PROFILE 14(MW)
AMPHETAMINE SCREEN URINE: NEGATIVE ng/mL
BARBITURATE SCREEN URINE: NEGATIVE ng/mL
BENZODIAZEPINE SCREEN, URINE: NEGATIVE ng/mL
BUPRENORPHINE, URINE: NEGATIVE ng/mL
CANNABINOIDS UR QL SCN: NEGATIVE ng/mL
Cocaine (Metab) Scrn, Ur: NEGATIVE ng/mL
Creatinine(Crt), U: 228.8 mg/dL (ref 20.0–300.0)
FENTANYL, URINE: NEGATIVE pg/mL
Meperidine Screen, Urine: NEGATIVE ng/mL
Methadone Screen, Urine: NEGATIVE ng/mL
OPIATE SCREEN URINE: NEGATIVE ng/mL
OXYCODONE+OXYMORPHONE UR QL SCN: NEGATIVE ng/mL
PH UR, DRUG SCRN: 7.1 (ref 4.5–8.9)
PHENCYCLIDINE QUANTITATIVE URINE: NEGATIVE ng/mL
Propoxyphene Scrn, Ur: NEGATIVE ng/mL
SPECIFIC GRAVITY: 1.022
TRAMADOL SCREEN, URINE: NEGATIVE ng/mL

## 2018-07-11 LAB — URINE CULTURE

## 2018-07-16 ENCOUNTER — Encounter: Payer: Self-pay | Admitting: Obstetrics and Gynecology

## 2018-07-18 ENCOUNTER — Other Ambulatory Visit: Payer: Self-pay

## 2018-07-18 DIAGNOSIS — Z3201 Encounter for pregnancy test, result positive: Secondary | ICD-10-CM

## 2018-07-18 MED ORDER — PRENATA 29-1 MG PO CHEW: 1.0000 | CHEWABLE_TABLET | Freq: Every day | ORAL | 4 refills | Status: DC

## 2018-07-18 MED ORDER — DOXYLAMINE-PYRIDOXINE ER 20-20 MG PO TBCR: 20.0000 mg | EXTENDED_RELEASE_TABLET | Freq: Every day | ORAL | 2 refills | Status: DC

## 2018-07-24 ENCOUNTER — Other Ambulatory Visit: Payer: Self-pay

## 2018-07-24 MED ORDER — ONDANSETRON HCL 4 MG PO TABS: 4.0000 mg | ORAL_TABLET | Freq: Three times a day (TID) | ORAL | 1 refills | Status: DC | PRN

## 2018-07-24 NOTE — Telephone Encounter (Signed)
Pt called no answer LM via voicemail to call the office to discuss medication [facility]

## 2018-07-27 ENCOUNTER — Telehealth: Payer: Self-pay | Admitting: Obstetrics and Gynecology

## 2018-07-27 NOTE — Telephone Encounter (Signed)
The patient called and stated that her insurance would not cover Bonjesta and Zofran was sent in instead. The patient also stated that her pharmacy did not receive her prescription of Zofran. I saw that it was sent in 07/24/18 and we a have a confirmation receipt of them receiving it. The patient would like a call back from her nurse, No other information was disclosed.

## 2018-07-27 NOTE — Telephone Encounter (Signed)
Pt was called and informed that both of her medications were available at the pharmacy to be picked up.

## 2018-07-27 NOTE — Telephone Encounter (Signed)
Pharmacy was called to see if they received the patient's rx for zofran 4mg  and approval for pt's bonjesta. The pharmacist stated that all medication was available for pick up.

## 2018-08-07 ENCOUNTER — Encounter: Payer: Self-pay | Admitting: Obstetrics and Gynecology

## 2018-08-15 ENCOUNTER — Ambulatory Visit (INDEPENDENT_AMBULATORY_CARE_PROVIDER_SITE_OTHER): Payer: BLUE CROSS/BLUE SHIELD | Admitting: Obstetrics and Gynecology

## 2018-08-15 ENCOUNTER — Encounter: Payer: Self-pay | Admitting: Obstetrics and Gynecology

## 2018-08-15 ENCOUNTER — Other Ambulatory Visit (INDEPENDENT_AMBULATORY_CARE_PROVIDER_SITE_OTHER): Payer: BLUE CROSS/BLUE SHIELD

## 2018-08-15 ENCOUNTER — Other Ambulatory Visit: Payer: Self-pay | Admitting: Obstetrics and Gynecology

## 2018-08-15 VITALS — BP 117/73 | HR 84 | Wt 228.9 lb

## 2018-08-15 DIAGNOSIS — O99342 Other mental disorders complicating pregnancy, second trimester: Secondary | ICD-10-CM

## 2018-08-15 DIAGNOSIS — O09292 Supervision of pregnancy with other poor reproductive or obstetric history, second trimester: Secondary | ICD-10-CM

## 2018-08-15 DIAGNOSIS — Z3482 Encounter for supervision of other normal pregnancy, second trimester: Secondary | ICD-10-CM

## 2018-08-15 DIAGNOSIS — O360120 Maternal care for anti-D [Rh] antibodies, second trimester, not applicable or unspecified: Secondary | ICD-10-CM

## 2018-08-15 DIAGNOSIS — N926 Irregular menstruation, unspecified: Secondary | ICD-10-CM

## 2018-08-15 DIAGNOSIS — Z3A14 14 weeks gestation of pregnancy: Secondary | ICD-10-CM

## 2018-08-15 DIAGNOSIS — Z3201 Encounter for pregnancy test, result positive: Secondary | ICD-10-CM

## 2018-08-15 DIAGNOSIS — F419 Anxiety disorder, unspecified: Secondary | ICD-10-CM

## 2018-08-15 DIAGNOSIS — O99212 Obesity complicating pregnancy, second trimester: Secondary | ICD-10-CM

## 2018-08-15 DIAGNOSIS — Z363 Encounter for antenatal screening for malformations: Secondary | ICD-10-CM

## 2018-08-15 DIAGNOSIS — F4321 Adjustment disorder with depressed mood: Secondary | ICD-10-CM

## 2018-08-15 DIAGNOSIS — O99210 Obesity complicating pregnancy, unspecified trimester: Secondary | ICD-10-CM

## 2018-08-15 DIAGNOSIS — O26899 Other specified pregnancy related conditions, unspecified trimester: Secondary | ICD-10-CM

## 2018-08-15 DIAGNOSIS — Z6791 Unspecified blood type, Rh negative: Secondary | ICD-10-CM

## 2018-08-15 LAB — POCT URINALYSIS DIPSTICK OB
BILIRUBIN UA: NEGATIVE
Glucose, UA: NEGATIVE — AB
KETONES UA: NEGATIVE
Nitrite, UA: NEGATIVE
PH UA: 8.5 — AB (ref 5.0–8.0)
POC,PROTEIN,UA: NEGATIVE
RBC UA: NEGATIVE
SPEC GRAV UA: 1.01 (ref 1.010–1.025)
UROBILINOGEN UA: 0.2 U/dL

## 2018-08-15 MED ORDER — ASPIRIN EC 81 MG PO TBEC: 81.0000 mg | DELAYED_RELEASE_TABLET | Freq: Every day | ORAL | 2 refills | Status: DC

## 2018-08-15 NOTE — Progress Notes (Signed)
OBSTETRIC INITIAL PRENATAL VISIT  Subjective:    Rhonda Davies is being seen today for her first obstetrical visit.  This is not a planned pregnancy. She is a G2P1001 female at [redacted]w[redacted]d gestation, Estimated Date of Delivery: 02/12/19 with Patient's last menstrual period was 03/06/2018.  Her obstetrical history is significant for fetal growth restriction (SGA infant),  obesity, pregnancy induced hypertension and pre-eclampsia. She also has a PMH of anxiety and depression, currently not on any medications. Relationship with FOB: significant other, living together. Patient does intend to breast feed. Pregnancy history fully reviewed.  Patient reports nausea and vomiting has resolved.     OB History  Gravida Para Term Preterm AB Living  2 1 1  0 0 1  SAB TAB Ectopic Multiple Live Births  0 0 0 0 1    # Outcome Date GA Lbr Len/2nd Weight Sex Delivery Anes PTL Lv  2 Current           1 Term 10/30/16 [redacted]w[redacted]d 00:55 4 lb 14.7 oz (2.23 kg) F Vag-Spont EPI  LIV     Name: Norgaard,GIRL Jahlisa     Apgar1: 2  Apgar5: 7    Obstetric Comments  1. Growth restriction, PIH in pregnancy with severe-pre-eclampsia developing in labor.    Gynecologic History:  Last pap smear was 1.2019.  Results were normal.  Has a h/o abnormal pap smears (x 1) in the past, in 2018.  Admits history of STIs: Chlamydia, trichomoniasis.    Past Medical History:  Diagnosis Date  . H/O chlamydia infection 01/27/2016   Chlamydia infection 12/2015, treated.    . H/O trichomoniasis 12/14/2016  . Kidney stone   . Left groin pain   . Personal history UTI   . Pregnancy induced hypertension      Family History  Problem Relation Age of Onset  . Heart disease Father   . Anxiety disorder Father   . Heart disease Maternal Grandfather   . Diabetes Paternal Grandmother   . Heart disease Paternal Grandfather   . Anxiety disorder Paternal Aunt   . Bipolar disorder Paternal Aunt   . Diabetes Paternal Aunt   . Anxiety disorder  Brother   . Aneurysm Paternal Uncle   . Breast cancer Neg Hx   . Colon cancer Neg Hx   . Ovarian cancer Neg Hx      Past Surgical History:  Procedure Laterality Date  . CYST REMOVED  2013   IN NECK  . TONSILLECTOMY AND ADENOIDECTOMY  2007     Social History   Socioeconomic History  . Marital status: Significant Other    Spouse name: Not on file  . Number of children: 1  . Years of education: Not on file  . Highest education level: 12th grade  Occupational History  . Not on file  Social Needs  . Financial resource strain: Not hard at all  . Food insecurity:    Worry: Never true    Inability: Never true  . Transportation needs:    Medical: No    Non-medical: No  Tobacco Use  . Smoking status: Never Smoker  . Smokeless tobacco: Never Used  Substance and Sexual Activity  . Alcohol use: No    Alcohol/week: 1.0 standard drinks    Types: 1 Standard drinks or equivalent per week    Frequency: Never  . Drug use: No  . Sexual activity: Yes    Partners: Male    Birth control/protection: Condom  Lifestyle  . Physical activity:  Days per week: 2 days    Minutes per session: 30 min  . Stress: Rather much  Relationships  . Social connections:    Talks on phone: More than three times a week    Gets together: More than three times a week    Attends religious service: Never    Active member of club or organization: No    Attends meetings of clubs or organizations: Never    Relationship status: Never married  . Intimate partner violence:    Fear of current or ex partner: No    Emotionally abused: No    Physically abused: No    Forced sexual activity: No  Other Topics Concern  . Not on file  Social History Narrative   Single.    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      Current Outpatient Medications on File Prior to Visit  Medication Sig Dispense Refill  . Doxylamine-Pyridoxine ER  (BONJESTA) 20-20 MG TBCR Take 20 mg by mouth at bedtime. 60 tablet 2  . ondansetron (ZOFRAN) 4 MG tablet Take 1 tablet (4 mg total) by mouth every 8 (eight) hours as needed for nausea or vomiting. 20 tablet 1  . Prenatal w/o A Vit-Fe Fum-FA (PRENATAL VITAMIN W/FE, FA) 29-1 MG CHEW Chew 1 tablet by mouth daily. 90 tablet 4  . sertraline (ZOLOFT) 50 MG tablet Take 1 tablet (50 mg total) by mouth daily. 30 tablet 6  . escitalopram (LEXAPRO) 5 MG tablet Take 1 tablet (5 mg total) by mouth daily. (Patient not taking: Reported on 07/09/2018) 7 tablet 0   No current facility-administered medications on file prior to visit.      Allergies  Allergen Reactions  . Benzonatate Swelling    Review of Systems General:Not Present- Fever, Weight Loss and Weight Gain. Skin:Not Present- Rash. HEENT:Not Present- Blurred Vision, Headache and Bleeding Gums. Respiratory:Not Present- Difficulty Breathing. Breast:Not Present- Breast Mass. Cardiovascular:Not Present- Chest Pain, Elevated Blood Pressure, Fainting / Blacking Out and Shortness of Breath. Gastrointestinal:Not Present- Abdominal Pain, Constipation, Nausea and Vomiting. Female Genitourinary:Not Present- Frequency, Painful Urination, Pelvic Pain, Vaginal Bleeding, Vaginal Discharge, Contractions, regular, Fetal Movements Decreased, Urinary Complaints and Vaginal Fluid. Musculoskeletal:Not Present- Back Pain and Leg Cramps. Neurological:Not Present- Dizziness. Psychiatric:Not Present- Depression.     Objective:   Blood pressure 117/73, pulse 84, weight 228 lb 14.4 oz (103.8 kg), last menstrual period 03/06/2018, not currently breastfeeding.  Body mass index is 39.29 kg/m.  General Appearance:    Alert, cooperative, no distress, appears stated age, moderate obesity  Head:    Normocephalic, without obvious abnormality, atraumatic  Eyes:    PERRL, conjunctiva/corneas clear, EOM's intact, both eyes  Ears:    Normal external ear canals, both  ears  Nose:   Nares normal, septum midline, mucosa normal, no drainage or sinus tenderness  Throat:   Lips, mucosa, and tongue normal; teeth and gums normal  Neck:   Supple, symmetrical, trachea midline, no adenopathy; thyroid: no enlargement/tenderness/nodules; no carotid bruit or JVD  Back:     Symmetric, no curvature, ROM normal, no CVA tenderness  Lungs:     Clear to auscultation bilaterally, respirations unlabored  Chest Wall:    No tenderness or deformity   Heart:    Regular rate and rhythm, S1 and S2 normal, no murmur, rub or gallop  Breast Exam:    No tenderness, masses, or nipple abnormality  Abdomen:     Soft, non-tender, bowel sounds active all four quadrants, no masses, no organomegaly.  FH 15.  FHT 152  bpm.  Genitalia:    Pelvic:external genitalia normal, vagina without lesions, or tenderness.  Small amount of watery white discharge present. Rectovaginal septum  normal. Cervix normal in appearance, no cervical motion tenderness, no adnexal masses or tenderness.  Pregnancy positive findings: uterine enlargement: 15-16 wk size, nontender.   Rectal:    Normal external sphincter.  No hemorrhoids appreciated. Internal exam not done.   Extremities:   Extremities normal, atraumatic, no cyanosis or edema  Pulses:   2+ and symmetric all extremities  Skin:   Skin color, texture, turgor normal, no rashes or lesions  Lymph nodes:   Cervical, supraclavicular, and axillary nodes normal  Neurologic:   CNII-XII intact, normal strength, sensation and reflexes throughout    Assessment:   Pregnancy at 14 and 1/7 weeks  History of pre-eclampsia in prior pregnancy, currently pregnant in second trimester History of intrauterine growth restriction in prior pregnancy, currently pregnant in second trimester Obesity in pregnancy Anxiety Situational depression Rh negative state in antepartum period  Plan:    Initial labs reviewed.  Rh negative. Will need Rhogam at 28 weeks.  Prenatal vitamins  encouraged. Problem list reviewed and updated. New OB counseling:  The patient has been given an overview regarding routine prenatal care.  Recommendations regarding diet, weight gain, and exercise in pregnancy were given. Prenatal testing, optional genetic testing, and ultrasound use in pregnancy were reviewed.  Second trimester screening and cell-free DNA testing discussed: undecided.  Given info on both. Can perform next visit if desired.  Benefits of Breast Feeding were discussed. The patient is encouraged to consider nursing her baby post partum. Early glucola ordered for obesity (and h/o pre-eclampsia in prior pregnancy) Will monitor growth during pregnancy.  H/o anxiety and depression currently on no meds. Asymptomatic currently. Will monitor during pregnancy.  To begin daily 81 mg aspirin.    Follow up in 4 weeks.  50% of 30 min visit spent on counseling and coordination of care.     Hildred Laserherry, Chukwuemeka Artola, MD [facility]

## 2018-08-15 NOTE — Progress Notes (Signed)
PT is present today for NOB PE stated that she is doing well. No complaints.

## 2018-08-15 NOTE — Patient Instructions (Signed)
Second Trimester of Pregnancy The second trimester is from week 13 through week 28, month 4 through 6. This is often the time in pregnancy that you feel your best. Often times, morning sickness has lessened or quit. You may have more energy, and you may get hungry more often. Your unborn baby (fetus) is growing rapidly. At the end of the sixth month, he or she is about 9 inches long and weighs about 1 pounds. You will likely feel the baby move (quickening) between 18 and 20 weeks of pregnancy. Follow these instructions at home:  Avoid all smoking, herbs, and alcohol. Avoid drugs not approved by your doctor.  Do not use any tobacco products, including cigarettes, chewing tobacco, and electronic cigarettes. If you need help quitting, ask your doctor. You may get counseling or other support to help you quit.  Only take medicine as told by your doctor. Some medicines are safe and some are not during pregnancy.  Exercise only as told by your doctor. Stop exercising if you start having cramps.  Eat regular, healthy meals.  Wear a good support bra if your breasts are tender.  Do not use hot tubs, steam rooms, or saunas.  Wear your seat belt when driving.  Avoid raw meat, uncooked cheese, and liter boxes and soil used by cats.  Take your prenatal vitamins.  Take 1500-2000 milligrams of calcium daily starting at the 20th week of pregnancy until you deliver your baby.  Try taking medicine that helps you poop (stool softener) as needed, and if your doctor approves. Eat more fiber by eating fresh fruit, vegetables, and whole grains. Drink enough fluids to keep your pee (urine) clear or pale yellow.  Take warm water baths (sitz baths) to soothe pain or discomfort caused by hemorrhoids. Use hemorrhoid cream if your doctor approves.  If you have puffy, bulging veins (varicose veins), wear support hose. Raise (elevate) your feet for 15 minutes, 3-4 times a day. Limit salt in your diet.  Avoid heavy  lifting, wear low heals, and sit up straight.  Rest with your legs raised if you have leg cramps or low back pain.  Visit your dentist if you have not gone during your pregnancy. Use a soft toothbrush to brush your teeth. Be gentle when you floss.  You can have sex (intercourse) unless your doctor tells you not to.  Go to your doctor visits. Get help if:  You feel dizzy.  You have mild cramps or pressure in your lower belly (abdomen).  You have a nagging pain in your belly area.  You continue to feel sick to your stomach (nauseous), throw up (vomit), or have watery poop (diarrhea).  You have bad smelling fluid coming from your vagina.  You have pain with peeing (urination). Get help right away if:  You have a fever.  You are leaking fluid from your vagina.  You have spotting or bleeding from your vagina.  You have severe belly cramping or pain.  You lose or gain weight rapidly.  You have trouble catching your breath and have chest pain.  You notice sudden or extreme puffiness (swelling) of your face, hands, ankles, feet, or legs.  You have not felt the baby move in over an hour.  You have severe headaches that do not go away with medicine.  You have vision changes. This information is not intended to replace advice given to you by your health care provider. Make sure you discuss any questions you have with your health care   provider. Document Released: 03/08/2010 Document Revised: 05/19/2016 Document Reviewed: 02/12/2013 Elsevier Interactive Patient Education  2017 [employer]  

## 2018-09-06 ENCOUNTER — Other Ambulatory Visit: Payer: Self-pay | Admitting: Nurse Practitioner

## 2018-09-06 ENCOUNTER — Other Ambulatory Visit: Payer: BLUE CROSS/BLUE SHIELD

## 2018-09-06 ENCOUNTER — Ambulatory Visit (INDEPENDENT_AMBULATORY_CARE_PROVIDER_SITE_OTHER): Payer: BLUE CROSS/BLUE SHIELD | Admitting: Obstetrics and Gynecology

## 2018-09-06 ENCOUNTER — Encounter: Payer: Self-pay | Admitting: Obstetrics and Gynecology

## 2018-09-06 VITALS — BP 110/70 | HR 101 | Wt 226.0 lb

## 2018-09-06 DIAGNOSIS — Z3482 Encounter for supervision of other normal pregnancy, second trimester: Secondary | ICD-10-CM | POA: Diagnosis not present

## 2018-09-06 DIAGNOSIS — O1212 Gestational proteinuria, second trimester: Secondary | ICD-10-CM

## 2018-09-06 DIAGNOSIS — O09292 Supervision of pregnancy with other poor reproductive or obstetric history, second trimester: Secondary | ICD-10-CM

## 2018-09-06 DIAGNOSIS — O99210 Obesity complicating pregnancy, unspecified trimester: Secondary | ICD-10-CM | POA: Diagnosis not present

## 2018-09-06 LAB — POCT URINALYSIS DIPSTICK OB
Bilirubin, UA: NEGATIVE
Glucose, UA: NEGATIVE
KETONES UA: NEGATIVE
NITRITE UA: NEGATIVE
PH UA: 7 (ref 5.0–8.0)
RBC UA: NEGATIVE
SPEC GRAV UA: 1.01 (ref 1.010–1.025)
UROBILINOGEN UA: 0.2 U/dL

## 2018-09-06 NOTE — Progress Notes (Signed)
ROB: Patient without complaints.  Doing early 1 hour GCT today.  Has decided upon MaterniT 21 and AFP-to be drawn today.  Schedule ultrasound fetal anatomic survey 3 weeks.  Proteinuria noted-patient to return next week for urine dip for protein.  Question nephrotic syndrome versus random elevation today.

## 2018-09-06 NOTE — Progress Notes (Signed)
Pt presents today for ROB. Pt states she is experiencing vaginal pain after intercourse. Otherwise, no concerns. Pt declined flu shot.

## 2018-09-06 NOTE — Addendum Note (Signed)
Addended by: Haskel KhanSMITH, Cheyna Retana M on: 09/06/2018 09:40 AM   Modules accepted: Orders

## 2018-09-07 LAB — GLUCOSE, 1 HOUR GESTATIONAL: GESTATIONAL DIABETES SCREEN: 101 mg/dL (ref 65–139)

## 2018-09-10 ENCOUNTER — Other Ambulatory Visit: Payer: BLUE CROSS/BLUE SHIELD

## 2018-09-11 LAB — MATERNIT21  PLUS CORE+ESS+SCA, BLOOD
CHROMOSOME 18: NEGATIVE
CHROMOSOME 21: NEGATIVE
Chromosome 13: NEGATIVE
Y Chromosome: NOT DETECTED

## 2018-09-12 ENCOUNTER — Other Ambulatory Visit: Payer: Self-pay | Admitting: Obstetrics and Gynecology

## 2018-09-12 ENCOUNTER — Ambulatory Visit: Payer: BLUE CROSS/BLUE SHIELD

## 2018-09-12 DIAGNOSIS — Z3689 Encounter for other specified antenatal screening: Secondary | ICD-10-CM

## 2018-09-12 DIAGNOSIS — Z3482 Encounter for supervision of other normal pregnancy, second trimester: Secondary | ICD-10-CM

## 2018-09-12 LAB — POCT URINALYSIS DIPSTICK OB
Bilirubin, UA: NEGATIVE
Glucose, UA: NEGATIVE
Ketones, UA: NEGATIVE
Nitrite, UA: NEGATIVE
PH UA: 7.5 (ref 5.0–8.0)
RBC UA: NEGATIVE
SPEC GRAV UA: 1.01 (ref 1.010–1.025)
UROBILINOGEN UA: 0.2 U/dL

## 2018-09-12 NOTE — Progress Notes (Unsigned)
Pt here today for U/A only to check protein per DJE.

## 2018-09-26 ENCOUNTER — Ambulatory Visit (INDEPENDENT_AMBULATORY_CARE_PROVIDER_SITE_OTHER): Payer: BLUE CROSS/BLUE SHIELD

## 2018-09-26 ENCOUNTER — Other Ambulatory Visit: Payer: Self-pay

## 2018-09-26 ENCOUNTER — Ambulatory Visit (INDEPENDENT_AMBULATORY_CARE_PROVIDER_SITE_OTHER): Payer: BLUE CROSS/BLUE SHIELD | Admitting: Obstetrics and Gynecology

## 2018-09-26 VITALS — BP 112/73 | HR 81 | Wt 225.6 lb

## 2018-09-26 DIAGNOSIS — O9989 Other specified diseases and conditions complicating pregnancy, childbirth and the puerperium: Secondary | ICD-10-CM

## 2018-09-26 DIAGNOSIS — B372 Candidiasis of skin and nail: Secondary | ICD-10-CM

## 2018-09-26 DIAGNOSIS — O2612 Low weight gain in pregnancy, second trimester: Secondary | ICD-10-CM

## 2018-09-26 DIAGNOSIS — Z3689 Encounter for other specified antenatal screening: Secondary | ICD-10-CM

## 2018-09-26 DIAGNOSIS — Z3482 Encounter for supervision of other normal pregnancy, second trimester: Secondary | ICD-10-CM

## 2018-09-26 DIAGNOSIS — Z363 Encounter for antenatal screening for malformations: Secondary | ICD-10-CM | POA: Diagnosis not present

## 2018-09-26 DIAGNOSIS — F32A Depression, unspecified: Secondary | ICD-10-CM

## 2018-09-26 DIAGNOSIS — F329 Major depressive disorder, single episode, unspecified: Secondary | ICD-10-CM

## 2018-09-26 DIAGNOSIS — O99342 Other mental disorders complicating pregnancy, second trimester: Secondary | ICD-10-CM

## 2018-09-26 LAB — POCT URINALYSIS DIPSTICK OB
Bilirubin, UA: NEGATIVE
Blood, UA: NEGATIVE
Glucose, UA: NEGATIVE
Ketones, UA: NEGATIVE
Nitrite, UA: NEGATIVE
Spec Grav, UA: 1.01
Urobilinogen, UA: 0.2 U/dL
pH, UA: 7.5

## 2018-09-26 MED ORDER — NYSTATIN 100000 UNIT/GM EX POWD: Freq: Two times a day (BID) | CUTANEOUS | 1 refills | Status: DC

## 2018-09-26 MED ORDER — ONDANSETRON HCL 4 MG PO TABS: 4.0000 mg | ORAL_TABLET | Freq: Three times a day (TID) | ORAL | 1 refills | Status: DC | PRN

## 2018-09-26 MED ORDER — DOXYLAMINE-PYRIDOXINE ER 20-20 MG PO TBCR: 20.0000 mg | EXTENDED_RELEASE_TABLET | Freq: Every day | ORAL | 2 refills | Status: DC

## 2018-09-26 MED ORDER — HYDROCORTISONE 0.5 % EX CREA: 1.0000 "application " | TOPICAL_CREAM | Freq: Two times a day (BID) | CUTANEOUS | 0 refills | Status: DC

## 2018-09-26 MED ORDER — SERTRALINE HCL 100 MG PO TABS: 100.0000 mg | ORAL_TABLET | Freq: Every day | ORAL | 9 refills | Status: DC

## 2018-09-26 NOTE — Progress Notes (Addendum)
ROB: Patient notes that she is becoming more moody, irritable, no longer thinks her current dose of Zoloft is working.  Notes her family members have been telling her that she is behaving "differently" lately.  Will increase Zoloft from 50 mg to 100 mg daily.  S/p normal anatomy scan. C/o rash underneath breasts bilaterally with mild odor and moisture. Will prescribe. Mildly erythematous macular rash in breast folds noted on exam. Prescribed hydrocortisone cream (to use for no more than 5 days) and Nystatin powder BID-TID. Has lost 16 lbs this pregnancy, finally appears to be a nadir of weight loss (only lost 1 lb since last visit). Discussed dietary supplementation. RTC in 4 weeks.

## 2018-09-26 NOTE — Progress Notes (Signed)
ROB-Pt stated that she has a rash between her breast and she thinks her Zoloft is not working. No other complaints.

## 2018-10-24 ENCOUNTER — Encounter: Payer: Self-pay | Admitting: Obstetrics and Gynecology

## 2018-10-25 ENCOUNTER — Ambulatory Visit (INDEPENDENT_AMBULATORY_CARE_PROVIDER_SITE_OTHER): Payer: BLUE CROSS/BLUE SHIELD | Admitting: Obstetrics and Gynecology

## 2018-10-25 ENCOUNTER — Encounter: Payer: Self-pay | Admitting: Obstetrics and Gynecology

## 2018-10-25 VITALS — BP 102/68 | HR 101 | Wt 222.0 lb

## 2018-10-25 DIAGNOSIS — O2612 Low weight gain in pregnancy, second trimester: Secondary | ICD-10-CM

## 2018-10-25 DIAGNOSIS — O09292 Supervision of pregnancy with other poor reproductive or obstetric history, second trimester: Secondary | ICD-10-CM

## 2018-10-25 DIAGNOSIS — Z3482 Encounter for supervision of other normal pregnancy, second trimester: Secondary | ICD-10-CM

## 2018-10-25 LAB — POCT URINALYSIS DIPSTICK OB
Bilirubin, UA: NEGATIVE
GLUCOSE, UA: NEGATIVE
Ketones, UA: NEGATIVE
Nitrite, UA: NEGATIVE
PH UA: 7 (ref 5.0–8.0)
POC,PROTEIN,UA: NEGATIVE
RBC UA: NEGATIVE
Spec Grav, UA: 1.01 (ref 1.010–1.025)
UROBILINOGEN UA: 0.2 U/dL

## 2018-10-25 NOTE — Progress Notes (Signed)
ROB:  C/o burning with urination that was worse last week but improved now.  Also c/o bilat pelvic pain c/w rd lig pain.  Discussed in detail.  Taking ASA as directed.  Discussed wt loss - pt not eating well.  She has promised to be more directed about this going forward. Urine sent for C&S.  1hr GCT next visit.

## 2018-10-25 NOTE — Progress Notes (Signed)
Pt presents today for ROB. Pt states she believes she has a UTI. Pt also complains of pelvic pain.

## 2018-11-21 ENCOUNTER — Other Ambulatory Visit: Payer: Self-pay | Admitting: Surgical

## 2018-11-21 ENCOUNTER — Encounter: Payer: Self-pay | Admitting: Obstetrics and Gynecology

## 2018-11-21 ENCOUNTER — Other Ambulatory Visit: Payer: BLUE CROSS/BLUE SHIELD

## 2018-11-21 ENCOUNTER — Ambulatory Visit (INDEPENDENT_AMBULATORY_CARE_PROVIDER_SITE_OTHER): Payer: BLUE CROSS/BLUE SHIELD | Admitting: Obstetrics and Gynecology

## 2018-11-21 VITALS — BP 113/68 | HR 108 | Wt 223.1 lb

## 2018-11-21 DIAGNOSIS — Z3483 Encounter for supervision of other normal pregnancy, third trimester: Secondary | ICD-10-CM | POA: Diagnosis not present

## 2018-11-21 DIAGNOSIS — Z3492 Encounter for supervision of normal pregnancy, unspecified, second trimester: Secondary | ICD-10-CM

## 2018-11-21 DIAGNOSIS — Z23 Encounter for immunization: Secondary | ICD-10-CM

## 2018-11-21 DIAGNOSIS — O2613 Low weight gain in pregnancy, third trimester: Secondary | ICD-10-CM

## 2018-11-21 DIAGNOSIS — O26893 Other specified pregnancy related conditions, third trimester: Secondary | ICD-10-CM

## 2018-11-21 DIAGNOSIS — Z6791 Unspecified blood type, Rh negative: Secondary | ICD-10-CM

## 2018-11-21 DIAGNOSIS — Z3A28 28 weeks gestation of pregnancy: Secondary | ICD-10-CM | POA: Diagnosis not present

## 2018-11-21 DIAGNOSIS — O2612 Low weight gain in pregnancy, second trimester: Secondary | ICD-10-CM

## 2018-11-21 DIAGNOSIS — O26899 Other specified pregnancy related conditions, unspecified trimester: Secondary | ICD-10-CM

## 2018-11-21 LAB — POCT URINALYSIS DIPSTICK OB
Bilirubin, UA: NEGATIVE
Glucose, UA: NEGATIVE
Ketones, UA: NEGATIVE
NITRITE UA: NEGATIVE
PH UA: 8 (ref 5.0–8.0)
Spec Grav, UA: 1.01 (ref 1.010–1.025)
UROBILINOGEN UA: 0.2 U/dL

## 2018-11-21 MED ORDER — TETANUS-DIPHTH-ACELL PERTUSSIS 5-2.5-18.5 LF-MCG/0.5 IM SUSP
0.5000 mL | Freq: Once | INTRAMUSCULAR | Status: AC
  Administered 2018-11-21: 0.5 mL via INTRAMUSCULAR

## 2018-11-21 MED ORDER — RHO D IMMUNE GLOBULIN 1500 UNIT/2ML IJ SOSY
300.0000 ug | PREFILLED_SYRINGE | Freq: Once | INTRAMUSCULAR | Status: AC
  Administered 2018-11-21: 300 ug via INTRAMUSCULAR

## 2018-11-21 NOTE — Progress Notes (Signed)
ROB: Still with poor weight gain in pregnancy.  Discussed Boost/Ensure. 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  For 28 week labs today.  Desires to breastfeed, desires vasectomy vs BTL (undecided) for contraception. For Tdap and Rhogam today, signed blood consent.  RTC in 2 weeks.

## 2018-11-21 NOTE — Patient Instructions (Signed)
Third Trimester of Pregnancy The third trimester is from week 28 through week 40 (months 7 through 9). The third trimester is a time when the unborn baby (fetus) is growing rapidly. At the end of the ninth month, the fetus is about 20 inches in length and weighs 6-10 pounds. Body changes during your third trimester Your body will continue to go through many changes during pregnancy. The changes vary from woman to woman. During the third trimester:  Your weight will continue to increase. You can expect to gain 25-35 pounds (11-16 kg) by the end of the pregnancy.  You may begin to get stretch marks on your hips, abdomen, and breasts.  You may urinate more often because the fetus is moving lower into your pelvis and pressing on your bladder.  You may develop or continue to have heartburn. This is caused by increased hormones that slow down muscles in the digestive tract.  You may develop or continue to have constipation because increased hormones slow digestion and cause the muscles that push waste through your intestines to relax.  You may develop hemorrhoids. These are swollen veins (varicose veins) in the rectum that can itch or be painful.  You may develop swollen, bulging veins (varicose veins) in your legs.  You may have increased body aches in the pelvis, back, or thighs. This is due to weight gain and increased hormones that are relaxing your joints.  You may have changes in your hair. These can include thickening of your hair, rapid growth, and changes in texture. Some women also have hair loss during or after pregnancy, or hair that feels dry or thin. Your hair will most likely return to normal after your baby is born.  Your breasts will continue to grow and they will continue to become tender. A yellow fluid (colostrum) may leak from your breasts. This is the first milk you are producing for your baby.  Your belly button may stick out.  You may notice more swelling in your hands,  face, or ankles.  You may have increased tingling or numbness in your hands, arms, and legs. The skin on your belly may also feel numb.  You may feel short of breath because of your expanding uterus.  You may have more problems sleeping. This can be caused by the size of your belly, increased need to urinate, and an increase in your body's metabolism.  You may notice the fetus "dropping," or moving lower in your abdomen (lightening).  You may have increased vaginal discharge.  You may notice your joints feel loose and you may have pain around your pelvic bone.  What to expect at prenatal visits You will have prenatal exams every 2 weeks until week 36. Then you will have weekly prenatal exams. During a routine prenatal visit:  You will be weighed to make sure you and the baby are growing normally.  Your blood pressure will be taken.  Your abdomen will be measured to track your baby's growth.  The fetal heartbeat will be listened to.  Any test results from the previous visit will be discussed.  You may have a cervical check near your due date to see if your cervix has softened or thinned (effaced).  You will be tested for Group B streptococcus. This happens between 35 and 37 weeks.  Your health care provider may ask you:  What your birth plan is.  How you are feeling.  If you are feeling the baby move.  If you have had   any abnormal symptoms, such as leaking fluid, bleeding, severe headaches, or abdominal cramping.  If you are using any tobacco products, including cigarettes, chewing tobacco, and electronic cigarettes.  If you have any questions.  Other tests or screenings that may be performed during your third trimester include:  Blood tests that check for low iron levels (anemia).  Fetal testing to check the health, activity level, and growth of the fetus. Testing is done if you have certain medical conditions or if there are problems during the  pregnancy.  Nonstress test (NST). This test checks the health of your baby to make sure there are no signs of problems, such as the baby not getting enough oxygen. During this test, a belt is placed around your belly. The baby is made to move, and its heart rate is monitored during movement.  What is false labor? False labor is a condition in which you feel small, irregular tightenings of the muscles in the womb (contractions) that usually go away with rest, changing position, or drinking water. These are called Braxton Hicks contractions. Contractions may last for hours, days, or even weeks before true labor sets in. If contractions come at regular intervals, become more frequent, increase in intensity, or become painful, you should see your health care provider. What are the signs of labor?  Abdominal cramps.  Regular contractions that start at 10 minutes apart and become stronger and more frequent with time.  Contractions that start on the top of the uterus and spread down to the lower abdomen and back.  Increased pelvic pressure and dull back pain.  A watery or bloody mucus discharge that comes from the vagina.  Leaking of amniotic fluid. This is also known as your "water breaking." It could be a slow trickle or a gush. Let your health care provider know if it has a color or strange odor. If you have any of these signs, call your health care provider right away, even if it is before your due date. Follow these instructions at home: Medicines  Follow your health care provider's instructions regarding medicine use. Specific medicines may be either safe or unsafe to take during pregnancy.  Take a prenatal vitamin that contains at least 600 micrograms (mcg) of folic acid.  If you develop constipation, try taking a stool softener if your health care provider approves. Eating and drinking  Eat a balanced diet that includes fresh fruits and vegetables, whole grains, good sources of protein  such as meat, eggs, or tofu, and low-fat dairy. Your health care provider will help you determine the amount of weight gain that is right for you.  Avoid raw meat and uncooked cheese. These carry germs that can cause birth defects in the baby.  If you have low calcium intake from food, talk to your health care provider about whether you should take a daily calcium supplement.  Eat four or five small meals rather than three large meals a day.  Limit foods that are high in fat and processed sugars, such as fried and sweet foods.  To prevent constipation: ? Drink enough fluid to keep your urine clear or pale yellow. ? Eat foods that are high in fiber, such as fresh fruits and vegetables, whole grains, and beans. Activity  Exercise only as directed by your health care provider. Most women can continue their usual exercise routine during pregnancy. Try to exercise for 30 minutes at least 5 days a week. Stop exercising if you experience uterine contractions.  Avoid heavy   lifting.  Do not exercise in extreme heat or humidity, or at high altitudes.  Wear low-heel, comfortable shoes.  Practice good posture.  You may continue to have sex unless your health care provider tells you otherwise. Relieving pain and discomfort  Take frequent breaks and rest with your legs elevated if you have leg cramps or low back pain.  Take warm sitz baths to soothe any pain or discomfort caused by hemorrhoids. Use hemorrhoid cream if your health care provider approves.  Wear a good support bra to prevent discomfort from breast tenderness.  If you develop varicose veins: ? Wear support pantyhose or compression stockings as told by your healthcare provider. ? Elevate your feet for 15 minutes, 3-4 times a day. Prenatal care  Write down your questions. Take them to your prenatal visits.  Keep all your prenatal visits as told by your health care provider. This is important. Safety  Wear your seat belt at  all times when driving.  Make a list of emergency phone numbers, including numbers for family, friends, the hospital, and police and fire departments. General instructions  Avoid cat litter boxes and soil used by cats. These carry germs that can cause birth defects in the baby. If you have a cat, ask someone to clean the litter box for you.  Do not travel far distances unless it is absolutely necessary and only with the approval of your health care provider.  Do not use hot tubs, steam rooms, or saunas.  Do not drink alcohol.  Do not use any products that contain nicotine or tobacco, such as cigarettes and e-cigarettes. If you need help quitting, ask your health care provider.  Do not use any medicinal herbs or unprescribed drugs. These chemicals affect the formation and growth of the baby.  Do not douche or use tampons or scented sanitary pads.  Do not cross your legs for long periods of time.  To prepare for the arrival of your baby: ? Take prenatal classes to understand, practice, and ask questions about labor and delivery. ? Make a trial run to the hospital. ? Visit the hospital and tour the maternity area. ? Arrange for maternity or paternity leave through employers. ? Arrange for family and friends to take care of pets while you are in the hospital. ? Purchase a rear-facing car seat and make sure you know how to install it in your car. ? Pack your hospital bag. ? Prepare the baby's nursery. Make sure to remove all pillows and stuffed animals from the baby's crib to prevent suffocation.  Visit your dentist if you have not gone during your pregnancy. Use a soft toothbrush to brush your teeth and be gentle when you floss. Contact a health care provider if:  You are unsure if you are in labor or if your water has broken.  You become dizzy.  You have mild pelvic cramps, pelvic pressure, or nagging pain in your abdominal area.  You have lower back pain.  You have persistent  nausea, vomiting, or diarrhea.  You have an unusual or bad smelling vaginal discharge.  You have pain when you urinate. Get help right away if:  Your water breaks before 37 weeks.  You have regular contractions less than 5 minutes apart before 37 weeks.  You have a fever.  You are leaking fluid from your vagina.  You have spotting or bleeding from your vagina.  You have severe abdominal pain or cramping.  You have rapid weight loss or weight gain.    You have shortness of breath with chest pain.  You notice sudden or extreme swelling of your face, hands, ankles, feet, or legs.  Your baby makes fewer than 10 movements in 2 hours.  You have severe headaches that do not go away when you take medicine.  You have vision changes. Summary  The third trimester is from week 28 through week 40, months 7 through 9. The third trimester is a time when the unborn baby (fetus) is growing rapidly.  During the third trimester, your discomfort may increase as you and your baby continue to gain weight. You may have abdominal, leg, and back pain, sleeping problems, and an increased need to urinate.  During the third trimester your breasts will keep growing and they will continue to become tender. A yellow fluid (colostrum) may leak from your breasts. This is the first milk you are producing for your baby.  False labor is a condition in which you feel small, irregular tightenings of the muscles in the womb (contractions) that eventually go away. These are called Braxton Hicks contractions. Contractions may last for hours, days, or even weeks before true labor sets in.  Signs of labor can include: abdominal cramps; regular contractions that start at 10 minutes apart and become stronger and more frequent with time; watery or bloody mucus discharge that comes from the vagina; increased pelvic pressure and dull back pain; and leaking of amniotic fluid. This information is not intended to replace advice  given to you by your health care provider. Make sure you discuss any questions you have with your health care provider. Document Released: 12/06/2001 Document Revised: 05/19/2016 Document Reviewed: 02/12/2013 Elsevier Interactive Patient Education  2017 [employer]vier [employer]  

## 2018-11-21 NOTE — Progress Notes (Signed)
ROB-Pt stated that she is doing well. Rhogam/tdap/BTC completed today.

## 2018-11-22 ENCOUNTER — Other Ambulatory Visit: Payer: Self-pay | Admitting: Obstetrics and Gynecology

## 2018-11-22 LAB — CBC
Hematocrit: 30.9 % — ABNORMAL LOW (ref 34.0–46.6)
Hemoglobin: 10.6 g/dL — ABNORMAL LOW (ref 11.1–15.9)
MCH: 29.5 pg (ref 26.6–33.0)
MCHC: 34.3 g/dL (ref 31.5–35.7)
MCV: 86 fL (ref 79–97)
Platelets: 279 10*3/uL (ref 150–450)
RBC: 3.59 x10E6/uL — AB (ref 3.77–5.28)
RDW: 12 % — AB (ref 12.3–15.4)
WBC: 10 10*3/uL (ref 3.4–10.8)

## 2018-11-22 LAB — RPR: RPR: NONREACTIVE

## 2018-11-22 LAB — GLUCOSE, 1 HOUR GESTATIONAL: GESTATIONAL DIABETES SCREEN: 135 mg/dL (ref 65–139)

## 2018-11-22 MED ORDER — FERRALET 90 90-1 MG PO TABS: 1.0000 | ORAL_TABLET | Freq: Every day | ORAL | 3 refills | Status: DC

## 2018-11-23 LAB — URINE CULTURE

## 2018-12-05 ENCOUNTER — Ambulatory Visit (INDEPENDENT_AMBULATORY_CARE_PROVIDER_SITE_OTHER): Payer: BLUE CROSS/BLUE SHIELD | Admitting: Obstetrics and Gynecology

## 2018-12-05 ENCOUNTER — Encounter: Payer: Self-pay | Admitting: Obstetrics and Gynecology

## 2018-12-05 VITALS — BP 107/70 | HR 94 | Wt 226.0 lb

## 2018-12-05 DIAGNOSIS — Z3483 Encounter for supervision of other normal pregnancy, third trimester: Secondary | ICD-10-CM

## 2018-12-05 DIAGNOSIS — O99013 Anemia complicating pregnancy, third trimester: Secondary | ICD-10-CM

## 2018-12-05 LAB — POCT URINALYSIS DIPSTICK OB
Bilirubin, UA: NEGATIVE
Glucose, UA: NEGATIVE
Nitrite, UA: NEGATIVE
Spec Grav, UA: 1.015 (ref 1.010–1.025)
Urobilinogen, UA: 0.2 E.U./dL
pH, UA: 7 (ref 5.0–8.0)

## 2018-12-05 NOTE — Progress Notes (Signed)
ROB-Pt stated that she is doing well but noticed some white discharge from her vaginal. No itching or burning from the discharge.

## 2018-12-05 NOTE — Progress Notes (Signed)
ROB: Patient doing well.  Notes white vaginal discharge with no itching or burning.  Advised on leukorrhea of pregnancy. Taking iron supplement for newly diagnosed anemia of pregnancy. RTC in 2 weeks. Has improved weight gain some.

## 2018-12-06 ENCOUNTER — Telehealth: Payer: Self-pay

## 2018-12-06 NOTE — Telephone Encounter (Signed)
Pt was called to speak more about the paperwork she left at the office. 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 Pt was informed that her paperwork was being completed and would be faxed in afterwards. Pt voiced that she understood.

## 2018-12-08 ENCOUNTER — Other Ambulatory Visit: Payer: Self-pay

## 2018-12-08 ENCOUNTER — Observation Stay
Admission: EM | Admit: 2018-12-08 | Discharge: 2018-12-08 | Disposition: A | Discharge status: Home / Self Care | Payer: BLUE CROSS/BLUE SHIELD | Attending: Obstetrics and Gynecology | Admitting: Obstetrics and Gynecology

## 2018-12-08 DIAGNOSIS — R51 Headache: Secondary | ICD-10-CM | POA: Diagnosis not present

## 2018-12-08 DIAGNOSIS — Z3A30 30 weeks gestation of pregnancy: Secondary | ICD-10-CM | POA: Insufficient documentation

## 2018-12-08 DIAGNOSIS — R42 Dizziness and giddiness: Secondary | ICD-10-CM | POA: Diagnosis not present

## 2018-12-08 DIAGNOSIS — O212 Late vomiting of pregnancy: Secondary | ICD-10-CM

## 2018-12-08 DIAGNOSIS — J101 Influenza due to other identified influenza virus with other respiratory manifestations: Secondary | ICD-10-CM

## 2018-12-08 DIAGNOSIS — O98813 Other maternal infectious and parasitic diseases complicating pregnancy, third trimester: Secondary | ICD-10-CM | POA: Diagnosis not present

## 2018-12-08 DIAGNOSIS — J111 Influenza due to unidentified influenza virus with other respiratory manifestations: Secondary | ICD-10-CM | POA: Diagnosis not present

## 2018-12-08 DIAGNOSIS — O26893 Other specified pregnancy related conditions, third trimester: Secondary | ICD-10-CM | POA: Diagnosis not present

## 2018-12-08 LAB — CBC
HCT: 30.3 % — ABNORMAL LOW (ref 36.0–46.0)
Hemoglobin: 9.9 g/dL — ABNORMAL LOW (ref 12.0–15.0)
MCH: 28.7 pg (ref 26.0–34.0)
MCHC: 32.7 g/dL (ref 30.0–36.0)
MCV: 87.8 fL (ref 80.0–100.0)
Platelets: 224 10*3/uL (ref 150–400)
RBC: 3.45 MIL/uL — ABNORMAL LOW (ref 3.87–5.11)
RDW: 13.2 % (ref 11.5–15.5)
WBC: 7.7 10*3/uL (ref 4.0–10.5)
nRBC: 0 % (ref 0.0–0.2)

## 2018-12-08 LAB — URINALYSIS, ROUTINE W REFLEX MICROSCOPIC
Bilirubin Urine: NEGATIVE
GLUCOSE, UA: NEGATIVE mg/dL
Hgb urine dipstick: NEGATIVE
Ketones, ur: 20 mg/dL — AB
NITRITE: NEGATIVE
PH: 6 (ref 5.0–8.0)
Protein, ur: 30 mg/dL — AB
RBC / HPF: >50 RBC/hpf — ABNORMAL HIGH (ref 0–5)
SPECIFIC GRAVITY, URINE: 1.019 (ref 1.005–1.030)
WBC, UA: >50 WBC/hpf — ABNORMAL HIGH (ref 0–5)

## 2018-12-08 LAB — INFLUENZA PANEL BY PCR (TYPE A & B)
Influenza A By PCR: NEGATIVE
Influenza B By PCR: POSITIVE — AB

## 2018-12-08 MED ORDER — ACETAMINOPHEN 325 MG PO TABS
650.0000 mg | ORAL_TABLET | Freq: Four times a day (QID) | ORAL | Status: DC | PRN
  Administered 2018-12-08: 650 mg via ORAL
  Filled 2018-12-08: qty 2

## 2018-12-08 MED ORDER — ONDANSETRON 4 MG PO TBDP
4.0000 mg | ORAL_TABLET | Freq: Four times a day (QID) | ORAL | Status: DC | PRN
  Administered 2018-12-08: 4 mg via ORAL
  Filled 2018-12-08: qty 1

## 2018-12-08 MED ORDER — OSELTAMIVIR PHOSPHATE 75 MG PO CAPS: 75.0000 mg | ORAL_CAPSULE | Freq: Two times a day (BID) | ORAL | 0 refills | Status: AC

## 2018-12-08 MED ORDER — OSELTAMIVIR PHOSPHATE 75 MG PO CAPS
75.0000 mg | ORAL_CAPSULE | Freq: Once | ORAL | Status: AC
  Administered 2018-12-08: 75 mg via ORAL
  Filled 2018-12-08: qty 1

## 2018-12-08 MED ORDER — ONDANSETRON HCL 4 MG PO TABS: 4.0000 mg | ORAL_TABLET | Freq: Three times a day (TID) | ORAL | 1 refills | Status: DC | PRN

## 2018-12-08 MED ORDER — LACTATED RINGERS IV BOLUS
1000.0000 mL | Freq: Once | INTRAVENOUS | Status: AC
  Administered 2018-12-08: 1000 mL via INTRAVENOUS

## 2018-12-08 MED ORDER — ACETAMINOPHEN ER 650 MG PO TBCR: 650.0000 mg | EXTENDED_RELEASE_TABLET | Freq: Three times a day (TID) | ORAL | 1 refills | Status: DC | PRN

## 2018-12-08 NOTE — Final Progress Note (Signed)
L&D OB Triage Note  Rhonda GinRobyn Nycole Davies is a 27 y.o. G2P1001 female at 458w4d, EDD Estimated Date of Delivery: 02/12/19 who presented to triage for complaints of shaking x 1 day, chills, fever, nausea/vomiting, headache, and dizziness. She does note recent sick contact (states her daughter was sick with URI symptoms several days ago). She denied contractions, vaginal bleeding, and noted good fetal movement.  She was evaluated by the nurses with findings significant for influenza (see labs). Vital signs stable. Afebrile while in triage. An NST was performed and has been reviewed by MD. She was treated with  Tamiflu, Tylenol, IV fluids, and Zofran.   NST INTERPRETATION: Indications: rule out uterine contractions  Mode: External Baseline Rate (A): 135 bpm Variability: Moderate Accelerations: 15 x 15       Contraction Frequency (min): 0  Impression: reactive   Labs:  Results for orders placed or performed during the hospital encounter of 12/08/18  Influenza panel by PCR (type A & B)  Result Value Ref Range   Influenza A By PCR NEGATIVE NEGATIVE   Influenza B By PCR POSITIVE (A) NEGATIVE  Urinalysis, Routine w reflex microscopic  Result Value Ref Range   Color, Urine AMBER (A) YELLOW   APPearance CLOUDY (A) CLEAR   Specific Gravity, Urine 1.019 1.005 - 1.030   pH 6.0 5.0 - 8.0   Glucose, UA NEGATIVE NEGATIVE mg/dL   Hgb urine dipstick NEGATIVE NEGATIVE   Bilirubin Urine NEGATIVE NEGATIVE   Ketones, ur 20 (A) NEGATIVE mg/dL   Protein, ur 30 (A) NEGATIVE mg/dL   Nitrite NEGATIVE NEGATIVE   Leukocytes, UA LARGE (A) NEGATIVE   RBC / HPF >50 (H) 0 - 5 RBC/hpf   WBC, UA >50 (H) 0 - 5 WBC/hpf   Bacteria, UA FEW (A) NONE SEEN   Squamous Epithelial / LPF 11-20 0 - 5   WBC Clumps PRESENT    Mucus PRESENT   CBC  Result Value Ref Range   WBC 7.7 4.0 - 10.5 K/uL   RBC 3.45 (L) 3.87 - 5.11 MIL/uL   Hemoglobin 9.9 (L) 12.0 - 15.0 g/dL   HCT 09.830.3 (L) 11.936.0 - 14.746.0 %   MCV 87.8 80.0 - 100.0  fL   MCH 28.7 26.0 - 34.0 pg   MCHC 32.7 30.0 - 36.0 g/dL   RDW 82.913.2 56.211.5 - 13.015.5 %   Platelets 224 150 - 400 K/uL   nRBC 0.0 0.0 - 0.2 %     Plan: NST performed was reviewed and was found to be reactive. She was discharged home with flu precautions.  Will send prescriptions for Zofran and Tamiflu. Encouraged on hydration, Tylenol prn.   Continue routine prenatal care. Follow up with OB/GYN as previously scheduled.     Hildred LaserAnika Shardee Dieu, MD  [facility]

## 2018-12-08 NOTE — Discharge Instructions (Signed)
Pregnancy and Influenza Influenza, also called the flu, is an infection of the respiratory tract. If you are pregnant, you are more likely to catch the flu. You are also more likely to have a more serious case of the flu. This is because pregnancy lowers your body's ability to fight off infections (it weakens your immune system). It also puts additional stress on your heart and lungs, which makes you more likely to have complications. Having a bad case of the flu, especially with a high fever, can be dangerous for your developing baby. It can cause you to go into early labor. How do people get the flu? The flu is caused by the influenza virus. This virus is common every year in the fall and winter. It spreads when virus particles get passed from person to person. You can get the virus if you are near a sick person who is coughing or sneezing. You can also get the virus if you touch something that has the virus on it and then touch your face. How can I protect myself against the flu?  Get a flu shot. The best way to prevent the flu is to get a flu shot before flu season starts. The flu shot is not dangerous for your developing baby. It may even help protect your baby from the flu for up to 6 months after birth. The flu shot is one type of flu vaccine. Another type is a nasal spray vaccine. Do not get the nasal spray vaccine. It is not approved for pregnancy.  Do not come in close contact with sick people.  Do not share food, drinks, or utensils with other people.  Wash your hands often. Use hand sanitizer when soap and water are not available. What should I do if I have flu symptoms? If you have any flu symptoms, call your health care provider right away. Flu symptoms include:  Fever or chills.  Muscle aches.  Headache.  Sore throat.  Nasal congestion.  Cough.  Feeling tired.  Loss of appetite.  Vomiting.  Diarrhea.  You may be able to take an antiviral medicine to keep the flu  from becoming severe and to shorten how long it lasts. What should I do at home if I am diagnosed with the flu?  Do not take any medicine, including cold or flu medicine, unless directed by your health care provider.  If you take antiviral medicine, make sure you finish it even if you start to feel better.  Drink enough fluid to keep your urine clear or pale yellow.  Get plenty of rest. When would I seek immediate medical care if I have the flu?  You have trouble breathing.  You have chest pain.  You begin to have labor pains.  You have a high fever that does not go down after you take medicine.  You do not feel your baby move.  You have diarrhea or vomiting that will not go away. This information is not intended to replace advice given to you by your health care provider. Make sure you discuss any questions you have with your health care provider. Document Released: 10/14/2008 Document Revised: 05/19/2016 Document Reviewed: 11/08/2013 Elsevier Interactive Patient Education  2017 Elsevier Inc.  

## 2018-12-08 NOTE — OB Triage Note (Signed)
Patient here for complaints of shaking X 1 day, being unable to get warm, fever 101.4, nausea vomiting, headache, dizziness. She denies ctx, lof or bleeding.

## 2018-12-11 ENCOUNTER — Telehealth: Payer: Self-pay | Admitting: Obstetrics and Gynecology

## 2018-12-11 NOTE — Telephone Encounter (Signed)
The patient was in the hospital over the weekend, and she states that Dr. Valentino Saxonherry said if she needed a doctor's note to call our office and get it.  She is needing it and wants to pick it up at lunchtime today, please advise, thanks.

## 2018-12-11 NOTE — Telephone Encounter (Signed)
Pt was called and informed that her excuse letter has been completed, printed and placed up front for pick up.

## 2018-12-13 ENCOUNTER — Telehealth: Payer: Self-pay | Admitting: Obstetrics and Gynecology

## 2018-12-13 NOTE — Telephone Encounter (Signed)
The patient called to check on the status of her light duty paperwork. Please advise.

## 2018-12-14 NOTE — Telephone Encounter (Signed)
Pt called no answer and was unable to leave a message due to no voicemail being set up.  

## 2018-12-18 ENCOUNTER — Encounter: Payer: Self-pay | Admitting: Obstetrics and Gynecology

## 2018-12-18 ENCOUNTER — Ambulatory Visit (INDEPENDENT_AMBULATORY_CARE_PROVIDER_SITE_OTHER): Payer: BLUE CROSS/BLUE SHIELD | Admitting: Obstetrics and Gynecology

## 2018-12-18 VITALS — BP 110/72 | HR 98 | Wt 222.0 lb

## 2018-12-18 DIAGNOSIS — N3001 Acute cystitis with hematuria: Secondary | ICD-10-CM

## 2018-12-18 DIAGNOSIS — Z3A32 32 weeks gestation of pregnancy: Secondary | ICD-10-CM

## 2018-12-18 DIAGNOSIS — O9989 Other specified diseases and conditions complicating pregnancy, childbirth and the puerperium: Secondary | ICD-10-CM

## 2018-12-18 DIAGNOSIS — Z3483 Encounter for supervision of other normal pregnancy, third trimester: Secondary | ICD-10-CM

## 2018-12-18 LAB — POCT URINALYSIS DIPSTICK OB
Bilirubin, UA: NEGATIVE
Glucose, UA: NEGATIVE
Ketones, UA: NEGATIVE
Nitrite, UA: NEGATIVE
Spec Grav, UA: 1.015 (ref 1.010–1.025)
Urobilinogen, UA: 0.2 E.U./dL
pH, UA: 6.5 (ref 5.0–8.0)

## 2018-12-18 MED ORDER — NITROFURANTOIN MONOHYD MACRO 100 MG PO CAPS: 100.0000 mg | ORAL_CAPSULE | Freq: Two times a day (BID) | ORAL | 0 refills | Status: DC

## 2018-12-18 NOTE — Progress Notes (Signed)
ROB:  Taking Fe as dir.  UTI based on urine dip - MacroBID.  U/S for growth next visit.

## 2018-12-18 NOTE — Progress Notes (Signed)
Patient is here for her 32 week OB visit. Patient states that she is having light blood when she wipes. Denies any pain at this time.

## 2018-12-21 NOTE — Telephone Encounter (Signed)
Spoke with pt via mychart on Tuesday, Dec 18, 2018 and informed pt that her [employer] paper work had been faxed in.

## 2018-12-26 NOTE — L&D Delivery Note (Signed)
       Delivery Note   [name] is a 28 y.o. [id number] at [redacted]w[redacted]d Estimated Date of Delivery: 02/12/19  PRE-OPERATIVE DIAGNOSIS:  1) [redacted]w[redacted]d pregnancy.    POST-OPERATIVE DIAGNOSIS:  1) [redacted]w[redacted]d pregnancy s/p Vaginal, Spontaneous    Delivery Type: Vaginal, Spontaneous    Delivery Anesthesia: Epidural   Labor Complications:       ESTIMATED BLOOD LOSS: 200  ml    FINDINGS:   1) female infant, Apgar scores of 7    at 1 minute and 9    at 5 minutes  SPECIMENS:   PLACENTA:   Appearance: Intact    Removal: Spontaneous      Disposition:     DISPOSITION:  Infant to left in stable condition in the delivery room, with L&D personnel and mother,  NARRATIVE SUMMARY: Labor course:  Rhonda Davies is a [id number] at [redacted]w[redacted]d who presented for induction of labor.  She progressed well in labor without pitocin.  She received the appropriate anesthesia and proceeded to complete dilation. She evidenced good maternal expulsive effort during the second stage. She went on to deliver a viable infant. The placenta delivered without problems and was noted to be complete. A perineal and vaginal examination was performed. Episiotomy/Lacerations: None   [name], M.D. 02/18/2019 3:35 PM

## 2019-01-02 ENCOUNTER — Ambulatory Visit (INDEPENDENT_AMBULATORY_CARE_PROVIDER_SITE_OTHER): Payer: BLUE CROSS/BLUE SHIELD

## 2019-01-02 ENCOUNTER — Ambulatory Visit (INDEPENDENT_AMBULATORY_CARE_PROVIDER_SITE_OTHER): Payer: BLUE CROSS/BLUE SHIELD | Admitting: Obstetrics and Gynecology

## 2019-01-02 VITALS — BP 109/68 | HR 96 | Wt 221.9 lb

## 2019-01-02 DIAGNOSIS — Z3483 Encounter for supervision of other normal pregnancy, third trimester: Secondary | ICD-10-CM

## 2019-01-02 DIAGNOSIS — Z362 Encounter for other antenatal screening follow-up: Secondary | ICD-10-CM

## 2019-01-02 DIAGNOSIS — O09293 Supervision of pregnancy with other poor reproductive or obstetric history, third trimester: Secondary | ICD-10-CM

## 2019-01-02 DIAGNOSIS — O2613 Low weight gain in pregnancy, third trimester: Secondary | ICD-10-CM

## 2019-01-02 DIAGNOSIS — O09299 Supervision of pregnancy with other poor reproductive or obstetric history, unspecified trimester: Secondary | ICD-10-CM

## 2019-01-02 LAB — POCT URINALYSIS DIPSTICK OB
Bilirubin, UA: NEGATIVE
Glucose, UA: NEGATIVE
Ketones, UA: NEGATIVE
Nitrite, UA: NEGATIVE
PH UA: 7.5 (ref 5.0–8.0)
Spec Grav, UA: 1.015 (ref 1.010–1.025)
Urobilinogen, UA: 0.2 E.U./dL

## 2019-01-02 MED ORDER — ONDANSETRON HCL 4 MG PO TABS: 4.0000 mg | ORAL_TABLET | Freq: Three times a day (TID) | ORAL | 2 refills | Status: DC | PRN

## 2019-01-02 NOTE — Progress Notes (Signed)
ROB-Pt stated that she is having pain with sexual intercourse.

## 2019-01-02 NOTE — Progress Notes (Signed)
ROB: Notes difficulty with intercourse (notes it is uncomfortable, not due to lubrication).  Discussed alternating positions. Still with poor weight gain of pregnancy and h/o IUGR in prior pregnancy, but growth scan performed today notes growth in 63%ile. RTC in 2 weeks for routine visit.

## 2019-01-11 ENCOUNTER — Other Ambulatory Visit: Payer: Self-pay | Admitting: Obstetrics and Gynecology

## 2019-01-16 ENCOUNTER — Ambulatory Visit (INDEPENDENT_AMBULATORY_CARE_PROVIDER_SITE_OTHER): Payer: BLUE CROSS/BLUE SHIELD | Admitting: Obstetrics and Gynecology

## 2019-01-16 ENCOUNTER — Encounter: Payer: Self-pay | Admitting: Obstetrics and Gynecology

## 2019-01-16 VITALS — BP 123/75 | HR 73 | Ht 64.0 in | Wt 233.0 lb

## 2019-01-16 DIAGNOSIS — Z3483 Encounter for supervision of other normal pregnancy, third trimester: Secondary | ICD-10-CM

## 2019-01-16 LAB — POCT URINALYSIS DIPSTICK OB
Glucose, UA: NEGATIVE
KETONES UA: 40
Nitrite, UA: NEGATIVE
SPEC GRAV UA: 1.01 (ref 1.010–1.025)
Urobilinogen, UA: 0.2 E.U./dL
pH, UA: 7 (ref 5.0–8.0)

## 2019-01-16 NOTE — Progress Notes (Signed)
Patient comes in today for her ROB. She will be getting GBS and GC/CH swabs today. Patient states that she is having more than usual pelvis pressure.

## 2019-01-16 NOTE — Progress Notes (Signed)
ROB: She has pelvic pressure symptoms "worse than usual. "  UA shows white cells and some blood.  Possible UTI.  Will treat with Macrobid and send C&S.  GC/CT-GBS performed.

## 2019-01-17 ENCOUNTER — Telehealth: Payer: Self-pay | Admitting: Obstetrics and Gynecology

## 2019-01-17 NOTE — Telephone Encounter (Signed)
The patient called and stated that Dr. Logan Bores never sent in a prescription for her bladder infection. The patient is hoping to receive a call back if possible. Please advise.

## 2019-01-18 LAB — STREP GP B NAA: Strep Gp B NAA: NEGATIVE

## 2019-01-18 LAB — URINE CULTURE

## 2019-01-18 LAB — GC/CHLAMYDIA PROBE AMP
Chlamydia trachomatis, NAA: NEGATIVE
Neisseria gonorrhoeae by PCR: NEGATIVE

## 2019-01-18 MED ORDER — NITROFURANTOIN MONOHYD MACRO 100 MG PO CAPS: 100.0000 mg | ORAL_CAPSULE | Freq: Two times a day (BID) | ORAL | 0 refills | Status: DC

## 2019-01-18 NOTE — Telephone Encounter (Signed)
Sent pt a mychart message stating that her medication has been sent to the pharmacy.

## 2019-01-18 NOTE — Telephone Encounter (Signed)
Pt called no answer and unable to leave a voicemail due to one not being set up. Was calling to let pt know that her medication has been called in to [facility] in [place].

## 2019-01-23 ENCOUNTER — Ambulatory Visit (INDEPENDENT_AMBULATORY_CARE_PROVIDER_SITE_OTHER): Payer: BLUE CROSS/BLUE SHIELD | Admitting: Obstetrics and Gynecology

## 2019-01-23 ENCOUNTER — Encounter: Payer: Self-pay | Admitting: Obstetrics and Gynecology

## 2019-01-23 VITALS — BP 126/83 | HR 80 | Wt 223.8 lb

## 2019-01-23 DIAGNOSIS — M7989 Other specified soft tissue disorders: Secondary | ICD-10-CM

## 2019-01-23 DIAGNOSIS — Z3483 Encounter for supervision of other normal pregnancy, third trimester: Secondary | ICD-10-CM

## 2019-01-23 LAB — POCT URINALYSIS DIPSTICK OB
Bilirubin, UA: NEGATIVE
Glucose, UA: NEGATIVE
Ketones, UA: NEGATIVE
Nitrite, UA: NEGATIVE
Spec Grav, UA: 1.015 (ref 1.010–1.025)
Urobilinogen, UA: 0.2 E.U./dL
pH, UA: 7.5 (ref 5.0–8.0)

## 2019-01-23 NOTE — Progress Notes (Signed)
ROB-pt for routine prenatal care. Pt stated that she is having a lot pain in the lower back. Pt stated that she has noticed that her feet has been swollen x 1 week.

## 2019-01-30 ENCOUNTER — Encounter: Payer: Self-pay | Admitting: Obstetrics and Gynecology

## 2019-01-30 ENCOUNTER — Ambulatory Visit (INDEPENDENT_AMBULATORY_CARE_PROVIDER_SITE_OTHER): Payer: BLUE CROSS/BLUE SHIELD | Admitting: Obstetrics and Gynecology

## 2019-01-30 VITALS — BP 108/71 | HR 99 | Wt 225.8 lb

## 2019-01-30 DIAGNOSIS — Z3483 Encounter for supervision of other normal pregnancy, third trimester: Secondary | ICD-10-CM | POA: Diagnosis not present

## 2019-01-30 LAB — POCT URINALYSIS DIPSTICK OB
Bilirubin, UA: NEGATIVE
Glucose, UA: NEGATIVE
Nitrite, UA: NEGATIVE
SPEC GRAV UA: 1.015 (ref 1.010–1.025)
Urobilinogen, UA: 0.2 E.U./dL
pH, UA: 6.5 (ref 5.0–8.0)

## 2019-01-30 NOTE — Progress Notes (Signed)
ROB-  Patient constant lower back pain x2 weeks.

## 2019-01-30 NOTE — Progress Notes (Signed)
ROB: Patient has uncomfortable back pain but denies contractions.  Reports daily fetal movement.  Taking vitamins and aspirin as directed.  Cervix unchanged from last week.  Signs and symptoms of labor discussed.

## 2019-02-06 ENCOUNTER — Ambulatory Visit: Payer: BLUE CROSS/BLUE SHIELD | Admitting: Obstetrics and Gynecology

## 2019-02-06 ENCOUNTER — Encounter: Payer: Self-pay | Admitting: Obstetrics and Gynecology

## 2019-02-06 VITALS — BP 126/75 | HR 91 | Ht 64.0 in | Wt 228.5 lb

## 2019-02-06 DIAGNOSIS — Z3483 Encounter for supervision of other normal pregnancy, third trimester: Secondary | ICD-10-CM

## 2019-02-06 LAB — POCT URINALYSIS DIPSTICK OB
BILIRUBIN UA: NEGATIVE
Glucose, UA: NEGATIVE
Ketones, UA: NEGATIVE
Nitrite, UA: NEGATIVE
Spec Grav, UA: 1.02 (ref 1.010–1.025)
UROBILINOGEN UA: NEGATIVE U/dL — AB
pH, UA: 7 (ref 5.0–8.0)

## 2019-02-06 NOTE — Progress Notes (Signed)
Patient comes in today for ROB visit. Patient has been having contractions since Sunday. The contractions ar getting more regular now. Patient says they are about 15 minutes apart.

## 2019-02-06 NOTE — Progress Notes (Addendum)
ROB: Patient complains of contractions every 10 minutes.  Reports normal fetal movement.  Denies leakage of fluid or bleeding.  Induction discussed in detail risk benefits reviewed scheduled for 2/24.  BPP 1 week

## 2019-02-06 NOTE — Addendum Note (Signed)
Addended by: [name] on: 02/06/2019 09:36 AM   Modules accepted: Orders, SmartSet

## 2019-02-13 ENCOUNTER — Other Ambulatory Visit: Payer: Self-pay | Admitting: Obstetrics and Gynecology

## 2019-02-13 ENCOUNTER — Encounter: Payer: Self-pay | Admitting: Obstetrics and Gynecology

## 2019-02-13 ENCOUNTER — Other Ambulatory Visit: Payer: Self-pay | Admitting: Surgical

## 2019-02-13 ENCOUNTER — Ambulatory Visit (INDEPENDENT_AMBULATORY_CARE_PROVIDER_SITE_OTHER): Payer: BLUE CROSS/BLUE SHIELD | Admitting: Obstetrics and Gynecology

## 2019-02-13 ENCOUNTER — Other Ambulatory Visit (INDEPENDENT_AMBULATORY_CARE_PROVIDER_SITE_OTHER): Payer: BLUE CROSS/BLUE SHIELD

## 2019-02-13 VITALS — BP 121/75 | HR 90 | Ht 64.0 in | Wt 228.0 lb

## 2019-02-13 DIAGNOSIS — O480 Post-term pregnancy: Secondary | ICD-10-CM | POA: Diagnosis not present

## 2019-02-13 DIAGNOSIS — Z3483 Encounter for supervision of other normal pregnancy, third trimester: Secondary | ICD-10-CM | POA: Diagnosis not present

## 2019-02-13 DIAGNOSIS — Z3A40 40 weeks gestation of pregnancy: Secondary | ICD-10-CM | POA: Diagnosis not present

## 2019-02-13 LAB — POCT URINALYSIS DIPSTICK OB
Bilirubin, UA: NEGATIVE
Glucose, UA: NEGATIVE
KETONES UA: NEGATIVE
Nitrite, UA: NEGATIVE
Spec Grav, UA: 1.01 (ref 1.010–1.025)
Urobilinogen, UA: 0.2 E.U./dL
pH, UA: 6 (ref 5.0–8.0)

## 2019-02-13 NOTE — Progress Notes (Signed)
ROB: Patient with occasional contractions.  Biophysical profile today 8 out of 8.  Induction scheduled for Monday-discussed Cytotec and AROM.  Signs and symptoms of labor reviewed.

## 2019-02-13 NOTE — Progress Notes (Signed)
Patient comes in today for 40 week ROB visit. Patient states that she is still having back pain.

## 2019-02-18 ENCOUNTER — Inpatient Hospital Stay: Payer: BLUE CROSS/BLUE SHIELD | Admitting: Anesthesiology

## 2019-02-18 ENCOUNTER — Other Ambulatory Visit: Payer: Self-pay

## 2019-02-18 ENCOUNTER — Inpatient Hospital Stay
Admission: RE | Admit: 2019-02-18 | Discharge: 2019-02-20 | DRG: 807 | Disposition: A | Discharge status: Home / Self Care | Payer: BLUE CROSS/BLUE SHIELD | Attending: Obstetrics and Gynecology | Admitting: Obstetrics and Gynecology

## 2019-02-18 DIAGNOSIS — Z3A40 40 weeks gestation of pregnancy: Secondary | ICD-10-CM | POA: Diagnosis not present

## 2019-02-18 DIAGNOSIS — O480 Post-term pregnancy: Secondary | ICD-10-CM | POA: Diagnosis not present

## 2019-02-18 DIAGNOSIS — O99214 Obesity complicating childbirth: Secondary | ICD-10-CM | POA: Diagnosis present

## 2019-02-18 LAB — TYPE AND SCREEN
ABO/RH(D): A NEG
Antibody Screen: NEGATIVE

## 2019-02-18 LAB — CBC
HCT: 33.6 % — ABNORMAL LOW (ref 36.0–46.0)
Hemoglobin: 10.8 g/dL — ABNORMAL LOW (ref 12.0–15.0)
MCH: 27.5 pg (ref 26.0–34.0)
MCHC: 32.1 g/dL (ref 30.0–36.0)
MCV: 85.5 fL (ref 80.0–100.0)
Platelets: 239 10*3/uL (ref 150–400)
RBC: 3.93 MIL/uL (ref 3.87–5.11)
RDW: 14.2 % (ref 11.5–15.5)
WBC: 8.5 10*3/uL (ref 4.0–10.5)
nRBC: 0 % (ref 0.0–0.2)

## 2019-02-18 MED ORDER — CARBOPROST TROMETHAMINE 250 MCG/ML IM SOLN
INTRAMUSCULAR | Status: AC
  Filled 2019-02-18: qty 1

## 2019-02-18 MED ORDER — LIDOCAINE HCL (PF) 1 % IJ SOLN
30.0000 mL | INTRAMUSCULAR | Status: DC | PRN
  Filled 2019-02-18: qty 30

## 2019-02-18 MED ORDER — SIMETHICONE 80 MG PO CHEW: 80.0000 mg | CHEWABLE_TABLET | ORAL | Status: DC | PRN

## 2019-02-18 MED ORDER — LIDOCAINE-EPINEPHRINE (PF) 1.5 %-1:200000 IJ SOLN
INTRAMUSCULAR | Status: DC | PRN
  Administered 2019-02-18: 3 mL via PERINEURAL

## 2019-02-18 MED ORDER — FENTANYL 2.5 MCG/ML W/ROPIVACAINE 0.15% IN NS 100 ML EPIDURAL (ARMC)
12.0000 mL/h | EPIDURAL | Status: DC
  Administered 2019-02-18 (×2): 12 mL/h via EPIDURAL

## 2019-02-18 MED ORDER — MISOPROSTOL 25 MCG QUARTER TABLET
50.0000 ug | ORAL_TABLET | ORAL | Status: DC | PRN
  Administered 2019-02-18 (×2): 50 ug via VAGINAL
  Filled 2019-02-18 (×2): qty 1

## 2019-02-18 MED ORDER — ONDANSETRON HCL 4 MG/2ML IJ SOLN
4.0000 mg | Freq: Four times a day (QID) | INTRAMUSCULAR | Status: DC | PRN
  Administered 2019-02-18: 4 mg via INTRAVENOUS
  Filled 2019-02-18: qty 2

## 2019-02-18 MED ORDER — PHENYLEPHRINE 40 MCG/ML (10ML) SYRINGE FOR IV PUSH (FOR BLOOD PRESSURE SUPPORT)
80.0000 ug | PREFILLED_SYRINGE | INTRAVENOUS | Status: DC | PRN
  Filled 2019-02-18: qty 10

## 2019-02-18 MED ORDER — DIPHENHYDRAMINE HCL 25 MG PO CAPS: 25.0000 mg | ORAL_CAPSULE | Freq: Four times a day (QID) | ORAL | Status: DC | PRN

## 2019-02-18 MED ORDER — EPHEDRINE 5 MG/ML INJ
10.0000 mg | INTRAVENOUS | Status: DC | PRN
  Filled 2019-02-18: qty 2

## 2019-02-18 MED ORDER — ZOLPIDEM TARTRATE 5 MG PO TABS: 5.0000 mg | ORAL_TABLET | Freq: Every evening | ORAL | Status: DC | PRN

## 2019-02-18 MED ORDER — IBUPROFEN 600 MG PO TABS
600.0000 mg | ORAL_TABLET | Freq: Four times a day (QID) | ORAL | Status: DC
  Administered 2019-02-18 – 2019-02-20 (×8): 600 mg via ORAL
  Filled 2019-02-18 (×7): qty 1

## 2019-02-18 MED ORDER — SOD CITRATE-CITRIC ACID 500-334 MG/5ML PO SOLN: 30.0000 mL | ORAL | Status: DC | PRN

## 2019-02-18 MED ORDER — BUPIVACAINE HCL (PF) 0.25 % IJ SOLN
INTRAMUSCULAR | Status: DC | PRN
  Administered 2019-02-18: 4 mL via EPIDURAL
  Administered 2019-02-18: 5 mL via EPIDURAL

## 2019-02-18 MED ORDER — LIDOCAINE HCL (PF) 1 % IJ SOLN
INTRAMUSCULAR | Status: DC | PRN
  Administered 2019-02-18: 3 mL via SUBCUTANEOUS
  Administered 2019-02-18: 2 mL via SUBCUTANEOUS

## 2019-02-18 MED ORDER — PRENATAL MULTIVITAMIN CH
1.0000 | ORAL_TABLET | Freq: Every day | ORAL | Status: DC
  Administered 2019-02-19 – 2019-02-20 (×2): 1 via ORAL
  Filled 2019-02-18 (×2): qty 1

## 2019-02-18 MED ORDER — ACETAMINOPHEN 500 MG PO TABS
ORAL_TABLET | ORAL | Status: AC
  Filled 2019-02-18: qty 2

## 2019-02-18 MED ORDER — LACTATED RINGERS IV SOLN
INTRAVENOUS | Status: DC
  Administered 2019-02-18 (×2): via INTRAVENOUS

## 2019-02-18 MED ORDER — OXYTOCIN 40 UNITS IN NORMAL SALINE INFUSION - SIMPLE MED
2.5000 [IU]/h | INTRAVENOUS | Status: DC
  Administered 2019-02-18: 2.5 [IU]/h via INTRAVENOUS
  Filled 2019-02-18: qty 1000

## 2019-02-18 MED ORDER — IBUPROFEN 600 MG PO TABS
ORAL_TABLET | ORAL | Status: AC
  Filled 2019-02-18: qty 1

## 2019-02-18 MED ORDER — BENZOCAINE-MENTHOL 20-0.5 % EX AERO: 1.0000 "application " | INHALATION_SPRAY | CUTANEOUS | Status: DC | PRN

## 2019-02-18 MED ORDER — METHYLERGONOVINE MALEATE 0.2 MG/ML IJ SOLN
INTRAMUSCULAR | Status: AC
  Filled 2019-02-18: qty 1

## 2019-02-18 MED ORDER — DOCUSATE SODIUM 100 MG PO CAPS
100.0000 mg | ORAL_CAPSULE | Freq: Two times a day (BID) | ORAL | Status: DC
  Administered 2019-02-18 – 2019-02-19 (×2): 100 mg via ORAL
  Filled 2019-02-18 (×2): qty 1

## 2019-02-18 MED ORDER — LACTATED RINGERS IV SOLN
500.0000 mL | INTRAVENOUS | Status: DC | PRN
  Administered 2019-02-18: 1000 mL via INTRAVENOUS

## 2019-02-18 MED ORDER — OXYCODONE-ACETAMINOPHEN 5-325 MG PO TABS: 1.0000 | ORAL_TABLET | ORAL | Status: DC | PRN

## 2019-02-18 MED ORDER — ACETAMINOPHEN 325 MG PO TABS
650.0000 mg | ORAL_TABLET | ORAL | Status: DC | PRN
  Administered 2019-02-19: 650 mg via ORAL
  Filled 2019-02-18: qty 2

## 2019-02-18 MED ORDER — TETANUS-DIPHTH-ACELL PERTUSSIS 5-2.5-18.5 LF-MCG/0.5 IM SUSP: 0.5000 mL | Freq: Once | INTRAMUSCULAR | Status: DC

## 2019-02-18 MED ORDER — ACETAMINOPHEN 325 MG PO TABS: 650.0000 mg | ORAL_TABLET | ORAL | Status: DC | PRN

## 2019-02-18 MED ORDER — MISOPROSTOL 200 MCG PO TABS
ORAL_TABLET | ORAL | Status: AC
  Administered 2019-02-18: 50 ug via VAGINAL
  Filled 2019-02-18: qty 5

## 2019-02-18 MED ORDER — OXYTOCIN BOLUS FROM INFUSION
500.0000 mL | Freq: Once | INTRAVENOUS | Status: AC
  Administered 2019-02-18: 500 mL via INTRAVENOUS

## 2019-02-18 MED ORDER — SERTRALINE HCL 100 MG PO TABS
100.0000 mg | ORAL_TABLET | Freq: Every day | ORAL | Status: DC
  Administered 2019-02-19 – 2019-02-20 (×2): 100 mg via ORAL
  Filled 2019-02-18 (×2): qty 1

## 2019-02-18 MED ORDER — OXYTOCIN 40 UNITS IN NORMAL SALINE INFUSION - SIMPLE MED
2.5000 [IU]/h | INTRAVENOUS | Status: DC | PRN
  Filled 2019-02-18: qty 1000

## 2019-02-18 MED ORDER — FENTANYL 2.5 MCG/ML W/ROPIVACAINE 0.15% IN NS 100 ML EPIDURAL (ARMC)
EPIDURAL | Status: AC
  Filled 2019-02-18: qty 100

## 2019-02-18 MED ORDER — DIPHENHYDRAMINE HCL 50 MG/ML IJ SOLN: 12.5000 mg | INTRAMUSCULAR | Status: DC | PRN

## 2019-02-18 MED ORDER — SERTRALINE HCL 100 MG PO TABS: 100.0000 mg | ORAL_TABLET | Freq: Every day | ORAL | Status: DC

## 2019-02-18 MED ORDER — LACTATED RINGERS IV SOLN: 500.0000 mL | Freq: Once | INTRAVENOUS | Status: DC

## 2019-02-18 MED ORDER — ACETAMINOPHEN 500 MG PO TABS
1000.0000 mg | ORAL_TABLET | Freq: Once | ORAL | Status: AC
  Administered 2019-02-18: 1000 mg via ORAL

## 2019-02-18 MED ORDER — BUTORPHANOL TARTRATE 1 MG/ML IJ SOLN: 1.0000 mg | INTRAMUSCULAR | Status: DC | PRN

## 2019-02-18 NOTE — Progress Notes (Signed)
   02/18/19 1000  Clinical Encounter Type  Visited With Patient and family together  Visit Type Initial;Spiritual support  Referral From Nurse  Consult/Referral To Chaplain  Spiritual Encounters  Spiritual Needs Prayer;Emotional  Stress Factors  Patient Stress Factors Other (Comment)  Family Stress Factors Other (Comment)  Chaplain received OR for prayer and visit the patient. Patient family was present with her. Father, mother and father of baby. Chaplain introduced herself to everyone , very pleasant family! Father of patient was very interactive with Chaplain, he ask did Chaplain have a direct line to God. Chaplain said yes because she needs Him. Father agreed. Chaplain offered words of encouragement and prayer for a safe and peaceful deliver.

## 2019-02-18 NOTE — Anesthesia Preprocedure Evaluation (Signed)
Anesthesia Evaluation  Patient identified by MRN, date of birth, ID band Patient awake and Patient confused    Reviewed: Allergy & Precautions, H&P , NPO status , Patient's Chart, lab work & pertinent test results  Airway Mallampati: III  TM Distance: >3 FB Neck ROM: full    Dental  (+) Teeth Intact   Pulmonary           Cardiovascular hypertension,      Neuro/Psych  Headaches, PSYCHIATRIC DISORDERS Anxiety Depression    GI/Hepatic   Endo/Other    Renal/GU      Musculoskeletal   Abdominal   Peds  Hematology   Anesthesia Other Findings   Reproductive/Obstetrics (+) Pregnancy                             Anesthesia Physical Anesthesia Plan  ASA: II  Anesthesia Plan:    Post-op Pain Management:    Induction:   PONV Risk Score and Plan:   Airway Management Planned:   Additional Equipment:   Intra-op Plan:   Post-operative Plan:   Informed Consent: I have reviewed the patients History and Physical, chart, labs and discussed the procedure including the risks, benefits and alternatives for the proposed anesthesia with the patient or authorized representative who has indicated his/her understanding and acceptance.     Dental Advisory Given  Plan Discussed with: Anesthesiologist and CRNA  Anesthesia Plan Comments:         Anesthesia Quick Evaluation

## 2019-02-18 NOTE — Anesthesia Procedure Notes (Signed)
Epidural Patient location during procedure: OB  Staffing Anesthesiologist: Lenard Simmer, MD Resident/CRNA: Irving Burton, CRNA Performed: resident/CRNA   Preanesthetic Checklist Completed: patient identified, site marked, surgical consent, pre-op evaluation, timeout performed, IV checked, risks and benefits discussed and monitors and equipment checked  Epidural Patient position: sitting Prep: ChloraPrep Patient monitoring: heart rate, continuous pulse ox and blood pressure Approach: midline Location: L3-L4 Injection technique: LOR saline  Needle:  Needle type: Tuohy  Needle gauge: 17 G Needle length: 9 cm and 9 Needle insertion depth: 7 cm Catheter type: closed end flexible Catheter size: 19 Gauge Catheter at skin depth: 12 cm Test dose: negative and 1.5% lidocaine with Epi 1:200 K  Assessment Events: blood not aspirated, injection not painful, no injection resistance, negative IV test and no paresthesia  Additional Notes 1 attempt Pt. Evaluated and documentation done after procedure finished. Patient identified. Risks/Benefits/Options discussed with patient including but not limited to bleeding, infection, nerve damage, paralysis, failed block, incomplete pain control, headache, blood pressure changes, nausea, vomiting, reactions to medication both or allergic, itching and postpartum back pain. Confirmed with bedside nurse the patient's most recent platelet count. Confirmed with patient that they are not currently taking any anticoagulation, have any bleeding history or any family history of bleeding disorders. Patient expressed understanding and wished to proceed. All questions were answered. Sterile technique was used throughout the entire procedure. Please see nursing notes for vital signs. Test dose was given through epidural catheter and negative prior to continuing to dose epidural or start infusion. Warning signs of high block given to the patient including shortness  of breath, tingling/numbness in hands, complete motor block, or any concerning symptoms with instructions to call for help. Patient was given instructions on fall risk and not to get out of bed. All questions and concerns addressed with instructions to call with any issues or inadequate analgesia.   Patient tolerated the insertion well without immediate complications.Reason for block:procedure for pain

## 2019-02-18 NOTE — Lactation Note (Signed)
This note was copied from a baby's chart. Lactation Consultation Note  Patient Name: Rhonda Davies [id number] Date: 02/18/2019 Reason for consult: Initial assessment;Term Assisted mom with first breast feeding after delivery.  Charlene latched with minimal assistance and began strong rhythmic sucking with occasional swallow.  Mom denies any pain.  Mom breast fed her first for 3 1/2 months but supplemented from the beginning.  She was born at only 4 lbs 15 oz because mom had severe pre eclampsia.  Mom really wanting to breast feed this baby longer and without supplementing.  She is waiting for the doctor to write an order for her to get her DEBP from her [employer].  Reviewed supply and demand, normal course of lactation and routine newborn feeding patterns.    Maternal Data Formula Feeding for Exclusion: No Has patient been taught Hand Expression?: Yes(Can easily hand express colostrum) Does the patient have breastfeeding experience prior to this delivery?: Yes  Feeding Feeding Type: Breast Fed  LATCH Score Latch: Repeated attempts needed to sustain latch, nipple held in mouth throughout feeding, stimulation needed to elicit sucking reflex.  Audible Swallowing: A few with stimulation  Type of Nipple: Everted at rest and after stimulation(Large nipple)  Comfort (Breast/Nipple): Soft / non-tender  Hold (Positioning): Assistance needed to correctly position infant at breast and maintain latch.  LATCH Score: 7  Interventions Interventions: Breast feeding basics reviewed;Assisted with latch;Skin to skin;Breast massage;Hand express;Reverse pressure;Breast compression;Adjust position;Support pillows;Position options  Lactation Tools Discussed/Used WIC Program: No(BCBS Insurance)   Consult Status Consult Status: PRN    Louis Meckel 02/18/2019, 8:09 PM

## 2019-02-18 NOTE — Progress Notes (Signed)
Patient ID: Rhonda Davies, female   DOB: 22-May-1991, 28 y.o.   MRN: [id number]   AROM - clear Accels noted. Pt not feeling contractions cvx - 3cm 75% Misoprostol placed.

## 2019-02-18 NOTE — H&P (Signed)
History and Physical   HPI  Rhonda Davies is a 28 y.o. G2P1001 at [redacted]w[redacted]d Estimated Date of Delivery: 02/12/19 who is being admitted for  induction of labor for post -dates.   OB History  OB History  Gravida Para Term Preterm AB Living  2 1 1  0 0 1  SAB TAB Ectopic Multiple Live Births  0 0 0 0 1    # Outcome Date GA Lbr Len/2nd Weight Sex Delivery Anes PTL Lv  2 Current           1 Term 10/30/16 [redacted]w[redacted]d 00:55 2230 g F Vag-Spont EPI  LIV     Name: Newbern,GIRL Alyanna     Apgar1: 2  Apgar5: 7    Obstetric Comments  1. Growth restriction, PIH in pregnancy with severe-pre-eclampsia developing in labor.    PROBLEM LIST  Pregnancy complications or risks: Patient Active Problem List   Diagnosis Date Noted  . Post-dates pregnancy 02/18/2019  . Influenza B 12/08/2018  . Anemia of pregnancy, third trimester 12/05/2018  . Current mild episode of major depressive disorder without prior episode (HCC) 06/12/2018  . Anxiety 04/03/2018  . Situational depression 04/03/2018  . Stress headaches 04/03/2018  . Family history of diabetes mellitus 03/20/2018  . H/O trichomoniasis 12/14/2016  . ASCUS with positive high risk HPV 05/29/2016  . Morbid obesity with BMI of 40.0-44.9, adult (HCC) 04/26/2016  . H/O chlamydia infection 01/27/2016     Prenatal labs and studies: ABO, Rh: A/Negative/-- (07/15 0933) Antibody: Negative (07/15 0933) Rubella: 1.09 (07/15 0933) RPR: Non Reactive (11/27 1113)  HBsAg: Negative (07/15 0933)  HIV: Non Reactive (07/15 0933)  CXK:GYJEHUDJ (01/22 1036)   Past Medical History:  Diagnosis Date  . H/O chlamydia infection 01/27/2016   Chlamydia infection 12/2015, treated.    . H/O trichomoniasis 12/14/2016  . Kidney stone   . Left groin pain   . Personal history UTI   . Pregnancy induced hypertension      Past Surgical History:  Procedure Laterality Date  . CYST REMOVED  2013   IN NECK  . TONSILLECTOMY AND ADENOIDECTOMY  2007      Medications    Current Discharge Medication List    CONTINUE these medications which have NOT CHANGED   Details  acetaminophen (TYLENOL 8 HOUR) 650 MG CR tablet Take 1 tablet (650 mg total) by mouth every 8 (eight) hours as needed for pain. Qty: 30 tablet, Refills: 1    aspirin EC 81 MG tablet Take 1 tablet (81 mg total) by mouth daily. Take after 12 weeks for prevention of preeclampssia later in pregnancy Qty: 300 tablet, Refills: 2    Fe Cbn-Fe Gluc-FA-B12-C-DSS (FERRALET 90) 90-1 MG TABS Take 1 tablet by mouth daily. Qty: 30 each, Refills: 3    ondansetron (ZOFRAN) 4 MG tablet Take 1 tablet (4 mg total) by mouth every 8 (eight) hours as needed for nausea or vomiting. Qty: 20 tablet, Refills: 2    Prenatal w/o A Vit-Fe Fum-FA (PRENATAL VITAMIN W/FE, FA) 29-1 MG CHEW Chew 1 tablet by mouth daily. Qty: 90 tablet, Refills: 4   Associated Diagnoses: Positive pregnancy test    sertraline (ZOLOFT) 100 MG tablet Take 1 tablet (100 mg total) by mouth daily. Qty: 30 tablet, Refills: 9    Doxylamine-Pyridoxine ER (BONJESTA) 20-20 MG TBCR Take 20 mg by mouth at bedtime. Qty: 60 tablet, Refills: 2    NYSTATIN powder APPLY TOPICALLY TO THE AFFECTED AREA TWICE DAILY Qty:  15 g, Refills: 1         Allergies  Benzonatate  Review of Systems  Pertinent items are noted in HPI.  Physical Exam  BP 122/83   Pulse (!) 106   Temp 98 F (36.7 C) (Oral)   Resp 16   Ht 5\' 4"  (1.626 m)   Wt 103.4 kg   LMP 03/06/2018   BMI 39.14 kg/m   Lungs:  CTA B Cardio: RRR without M/R/G Abd: Soft, gravid, NT Presentation: cephalic EXT: No C/C/ 1+ Edema DTRs: 2+ B CERVIX: Dilation: 2.5 Effacement (%): 50 Station: -3 Presentation: Vertex Exam by:: Charlena Cross, MD  50 mcg misoprostol placed.   See Prenatal records for more detailed PE.     FHR:  Accelerations: Reactive  Toco: Uterine Contractions: rare mild   Test Results  No results found for this or any previous visit  (from the past 24 hour(s)).   Assessment   G2P1001 at [redacted]w[redacted]d Estimated Date of Delivery: 02/12/19  The fetus is reassuring.    Patient Active Problem List   Diagnosis Date Noted  . Post-dates pregnancy 02/18/2019  . Influenza B 12/08/2018  . Anemia of pregnancy, third trimester 12/05/2018  . Current mild episode of major depressive disorder without prior episode (HCC) 06/12/2018  . Anxiety 04/03/2018  . Situational depression 04/03/2018  . Stress headaches 04/03/2018  . Family history of diabetes mellitus 03/20/2018  . H/O trichomoniasis 12/14/2016  . ASCUS with positive high risk HPV 05/29/2016  . Morbid obesity with BMI of 40.0-44.9, adult (HCC) 04/26/2016  . H/O chlamydia infection 01/27/2016    Plan  1. Admit to L&D :   induction 2. EFM: -- Category 1 3. Epidural if desired.  Stadol for IV pain until epidural requested. 4. Admission labs    [name], M.D. 02/18/2019 7:49 AM

## 2019-02-19 ENCOUNTER — Other Ambulatory Visit: Payer: Self-pay | Admitting: Surgical

## 2019-02-19 LAB — FETAL SCREEN: Fetal Screen: NEGATIVE

## 2019-02-19 LAB — RPR: RPR Ser Ql: NONREACTIVE

## 2019-02-19 MED ORDER — RHO D IMMUNE GLOBULIN 1500 UNIT/2ML IJ SOSY
300.0000 ug | PREFILLED_SYRINGE | Freq: Once | INTRAMUSCULAR | Status: AC
  Administered 2019-02-19: 300 ug via INTRAMUSCULAR
  Filled 2019-02-19: qty 2

## 2019-02-19 MED ORDER — BREAST PUMP MISC: 0 refills | Status: DC

## 2019-02-19 NOTE — Anesthesia Postprocedure Evaluation (Signed)
Anesthesia Post Note  Patient: Rhonda Davies  Procedure(s) Performed: AN AD HOC LABOR EPIDURAL  Patient location during evaluation: Mother Baby Anesthesia Type: Spinal Level of consciousness: oriented and awake and alert Pain management: pain level controlled Vital Signs Assessment: post-procedure vital signs reviewed and stable Respiratory status: spontaneous breathing and respiratory function stable Cardiovascular status: blood pressure returned to baseline and stable Postop Assessment: no headache, no backache, no apparent nausea or vomiting and able to ambulate Anesthetic complications: no     Last Vitals:  Vitals:   02/18/19 2351 02/19/19 0340  BP: (!) 106/52 (!) 119/59  Pulse: 74 64  Resp: 18 18  Temp: 37.1 C 36.8 C  SpO2: 99% 100%    Last Pain:  Vitals:   02/19/19 0340  TempSrc: Oral  PainSc:                  Starling Manns

## 2019-02-20 LAB — RHOGAM INJECTION: Unit division: 0

## 2019-02-20 MED ORDER — VARICELLA VIRUS VACCINE LIVE 1350 PFU/0.5ML IJ SUSR
0.5000 mL | Freq: Once | INTRAMUSCULAR | Status: AC
  Administered 2019-02-20: 0.5 mL via SUBCUTANEOUS
  Filled 2019-02-20 (×2): qty 0.5

## 2019-02-20 MED ORDER — COCONUT OIL OIL
1.0000 "application " | TOPICAL_OIL | Status: DC | PRN
  Filled 2019-02-20: qty 120

## 2019-02-20 NOTE — Progress Notes (Signed)
Patient discharged home with infant. Discharge instructions and prescriptions given and reviewed with patient. Patient verbalized understanding. Escorted out by auxillary.  

## 2019-02-20 NOTE — Discharge Summary (Signed)
                               Discharge Summary  Date of Admission: 02/18/2019  Date of Discharge: 02/20/2019  Admitting Diagnosis: Induction of labor at [redacted]w[redacted]d  Mode of Delivery: normal spontaneous vaginal delivery                 Discharge Diagnosis: No other diagnosis   Intrapartum Procedures: Atificial rupture of membranes and epidural   Post partum procedures:   Complications: none                      Discharge Day SOAP Note:  Progress Note - Vaginal Delivery  Rhonda Davies is a 28 y.o. G2P2002 now PP day 2 s/p Vaginal, Spontaneous . Delivery was uncomplicated  Subjective  The patient has the following complaints: has no unusual complaints  Pain is controlled with current medications.   Patient is urinating without difficulty.  She is ambulating well.    Objective  Vital signs: BP 119/70 (BP Location: Left Arm)   Pulse 75   Temp 98.7 F (37.1 C)   Resp 18   Ht 5\' 4"  (1.626 m)   Wt 103.4 kg   LMP 03/06/2018   SpO2 100%   Breastfeeding Unknown   BMI 39.14 kg/m   Physical Exam: Gen: NAD Fundus Fundal Tone: Firm  Lochia Amount: Small  Perineum Appearance: Edematous     Data Review Labs: CBC Latest Ref Rng & Units 02/18/2019 12/08/2018 11/21/2018  WBC 4.0 - 10.5 K/uL 8.5 7.7 10.0  Hemoglobin 12.0 - 15.0 g/dL 10.8(L) 9.9(L) 10.6(L)  Hematocrit 36.0 - 46.0 % 33.6(L) 30.3(L) 30.9(L)  Platelets 150 - 400 K/uL 239 224 279   A NEG  Assessment/Plan  Active Problems:   Post-dates pregnancy    Plan for discharge today.   Discharge Instructions: Per After Visit Summary. Activity: Advance as tolerated. Pelvic rest for 6 weeks.  Also refer to After Visit Summary Diet: Regular Medications: Allergies as of 02/20/2019      Reactions   Benzonatate Swelling      Medication List    STOP taking these medications   acetaminophen 650 MG CR tablet Commonly known as:  TYLENOL 8 HOUR   aspirin EC 81 MG tablet   Doxylamine-Pyridoxine ER 20-20 MG  Tbcr Commonly known as:  BONJESTA   FERRALET 90 90-1 MG Tabs   nystatin powder Generic drug:  nystatin   ondansetron 4 MG tablet Commonly known as:  ZOFRAN   prenatal vitamin w/FE, FA 29-1 MG Chew   sertraline 100 MG tablet Commonly known as:  ZOLOFT      Outpatient follow up:  Follow-up Information    Linzie Collin, MD Follow up in 6 week(s).   Specialties:  Obstetrics and Gynecology, Radiology Contact information: [address] [place] [address] [phone]          Postpartum contraception: Will discuss at first office visit post-partum  Discharged Condition: good  Discharged to: home  Newborn Data: Disposition:home with mother  Apgars: APGAR (1 MIN): 7   APGAR (5 MINS): 9   APGAR (10 MINS):    Baby Feeding: Breast    [name], M.D. 02/20/2019 2:17 PM

## 2019-04-01 ENCOUNTER — Encounter: Payer: Self-pay | Admitting: Obstetrics and Gynecology

## 2019-04-01 ENCOUNTER — Other Ambulatory Visit: Payer: Self-pay

## 2019-04-01 ENCOUNTER — Ambulatory Visit (INDEPENDENT_AMBULATORY_CARE_PROVIDER_SITE_OTHER): Payer: BLUE CROSS/BLUE SHIELD | Admitting: Obstetrics and Gynecology

## 2019-04-01 NOTE — Progress Notes (Signed)
HPI:      [name] is a 28 y.o. [id number] who LMP was No LMP recorded. (Menstrual status: Lactating).  Subjective:   She presents today for a postpartum visit.  She continues to breast-feed full-time.  She has not resumed intercourse.  She distinctly does not want OCPs or Nexplanon for birth control.  She is requesting IUD as soon as possible.    Hx: The following portions of the patient's history were reviewed and updated as appropriate:             She  has a past medical history of H/O chlamydia infection (01/27/2016), H/O trichomoniasis (12/14/2016), Kidney stone, Left groin pain, Personal history UTI, and Pregnancy induced hypertension. She does not have any pertinent problems on file. She  has a past surgical history that includes Tonsillectomy and adenoidectomy (2007) and CYST REMOVED (2013). Her family history includes Aneurysm in her paternal uncle; Anxiety disorder in her brother, father, and paternal aunt; Bipolar disorder in her paternal aunt; Diabetes in her paternal aunt and paternal grandmother; Heart disease in her father, maternal grandfather, and paternal grandfather. She  reports that she has never smoked. She has never used smokeless tobacco. She reports that she does not drink alcohol or use drugs. She currently has no medications in their medication list. She is allergic to benzonatate.       Review of Systems:  Review of Systems  Constitutional: Denied constitutional symptoms, night sweats, recent illness, fatigue, fever, insomnia and weight loss.  Eyes: Denied eye symptoms, eye pain, photophobia, vision change and visual disturbance.  Ears/Nose/Throat/Neck: Denied ear, nose, throat or neck symptoms, hearing loss, nasal discharge, sinus congestion and sore throat.  Cardiovascular: Denied cardiovascular symptoms, arrhythmia, chest pain/pressure, edema, exercise intolerance, orthopnea and palpitations.  Respiratory: Denied pulmonary symptoms, asthma, pleuritic  pain, productive sputum, cough, dyspnea and wheezing.  Gastrointestinal: Denied, gastro-esophageal reflux, melena, nausea and vomiting.  Genitourinary: Denied genitourinary symptoms including symptomatic vaginal discharge, pelvic relaxation issues, and urinary complaints.  Musculoskeletal: Denied musculoskeletal symptoms, stiffness, swelling, muscle weakness and myalgia.  Dermatologic: Denied dermatology symptoms, rash and scar.  Neurologic: Denied neurology symptoms, dizziness, headache, neck pain and syncope.  Psychiatric: Denied psychiatric symptoms, anxiety and depression.  Endocrine: Denied endocrine symptoms including hot flashes and night sweats.   Meds:   No current outpatient medications on file prior to visit.   No current facility-administered medications on file prior to visit.     Objective:     Vitals:   04/01/19 1003  BP: 128/76  Pulse: 92              Examination deferred until IUD placement  Assessment:    G2P2002 Patient Active Problem List   Diagnosis Date Noted  . Post-dates pregnancy 02/18/2019  . Influenza B 12/08/2018  . Anemia of pregnancy, third trimester 12/05/2018  . Current mild episode of major depressive disorder without prior episode (HCC) 06/12/2018  . Anxiety 04/03/2018  . Situational depression 04/03/2018  . Stress headaches 04/03/2018  . Family history of diabetes mellitus 03/20/2018  . H/O trichomoniasis 12/14/2016  . ASCUS with positive high risk HPV 05/29/2016  . Morbid obesity with BMI of 40.0-44.9, adult (HCC) 04/26/2016  . H/O chlamydia infection 01/27/2016     1. Postpartum care and examination immediately after delivery     Patient doing well without issue   Plan:            1.  Birth Control I discussed multiple birth control options  and methods with the patient.  The risks and benefits of each were reviewed. IUD Literature on Mirena given.  Risks and benefits discussed.  She is considering IUD as an option for  birth/cycle control. Patient has chosen Mirena IUD for birth control and would like it placed as soon as possible. Orders No orders of the defined types were placed in this encounter.   No orders of the defined types were placed in this encounter.     F/U  Return in about 1 week (around 04/08/2019).  [name], M.D. 04/01/2019 10:42 AM

## 2019-04-01 NOTE — Progress Notes (Signed)
Patient comes in today for PPV. Patient is nursing her daughter. She has not had intercourse since delivery. She would like to discuss IUD for birth control. Nexplanon caused the patient to have migraines and BC pills made her gain weight.

## 2019-04-02 ENCOUNTER — Telehealth: Payer: Self-pay | Admitting: Obstetrics and Gynecology

## 2019-04-02 NOTE — Telephone Encounter (Signed)
Pt called and informed that her work release form had been completed, signed and faxed to her job.

## 2019-04-02 NOTE — Telephone Encounter (Signed)
Patient called stating she needs a release to return to work from maternity leave faxed to her job. She states she forgot to ask for it yesterday. The fax number is [phone]. Thanks

## 2019-04-08 ENCOUNTER — Telehealth: Payer: Self-pay

## 2019-04-08 NOTE — Telephone Encounter (Signed)
Pt called and went over COVID-19 symptom questions with pt. Pt stated that she was not having any of those symptoms. Pt is also aware of the no visitor policy.

## 2019-04-09 ENCOUNTER — Ambulatory Visit (INDEPENDENT_AMBULATORY_CARE_PROVIDER_SITE_OTHER): Payer: BLUE CROSS/BLUE SHIELD | Admitting: Obstetrics and Gynecology

## 2019-04-09 ENCOUNTER — Encounter: Payer: Self-pay | Admitting: Obstetrics and Gynecology

## 2019-04-09 ENCOUNTER — Other Ambulatory Visit: Payer: Self-pay

## 2019-04-09 VITALS — BP 108/74 | HR 85 | Wt 217.8 lb

## 2019-04-09 DIAGNOSIS — Z30430 Encounter for insertion of intrauterine contraceptive device: Secondary | ICD-10-CM

## 2019-04-09 DIAGNOSIS — Z3202 Encounter for pregnancy test, result negative: Secondary | ICD-10-CM | POA: Diagnosis not present

## 2019-04-09 LAB — POCT URINE PREGNANCY: Preg Test, Ur: NEGATIVE

## 2019-04-09 NOTE — Progress Notes (Signed)
HPI:      Ms. Avlynn Rahaman is a 28 y.o. [id number] who LMP was Patient's last menstrual period was 04/08/2019.  Subjective:   She presents today for IUD insertion.  She has not resumed intercourse since the birth of her child.  She started her period yesterday.    Hx: The following portions of the patient's history were reviewed and updated as appropriate:             She  has a past medical history of H/O chlamydia infection (01/27/2016), H/O trichomoniasis (12/14/2016), Kidney stone, Left groin pain, Personal history UTI, and Pregnancy induced hypertension. She does not have any pertinent problems on file. She  has a past surgical history that includes Tonsillectomy and adenoidectomy (2007) and CYST REMOVED (2013). Her family history includes Aneurysm in her paternal uncle; Anxiety disorder in her brother, father, and paternal aunt; Bipolar disorder in her paternal aunt; Diabetes in her paternal aunt and paternal grandmother; Heart disease in her father, maternal grandfather, and paternal grandfather. She  reports that she has never smoked. She has never used smokeless tobacco. She reports that she does not drink alcohol or use drugs. She currently has no medications in their medication list. She is allergic to benzonatate.       Review of Systems:  Review of Systems  Constitutional: Denied constitutional symptoms, night sweats, recent illness, fatigue, fever, insomnia and weight loss.  Eyes: Denied eye symptoms, eye pain, photophobia, vision change and visual disturbance.  Ears/Nose/Throat/Neck: Denied ear, nose, throat or neck symptoms, hearing loss, nasal discharge, sinus congestion and sore throat.  Cardiovascular: Denied cardiovascular symptoms, arrhythmia, chest pain/pressure, edema, exercise intolerance, orthopnea and palpitations.  Respiratory: Denied pulmonary symptoms, asthma, pleuritic pain, productive sputum, cough, dyspnea and wheezing.  Gastrointestinal: Denied,  gastro-esophageal reflux, melena, nausea and vomiting.  Genitourinary: Denied genitourinary symptoms including symptomatic vaginal discharge, pelvic relaxation issues, and urinary complaints.  Musculoskeletal: Denied musculoskeletal symptoms, stiffness, swelling, muscle weakness and myalgia.  Dermatologic: Denied dermatology symptoms, rash and scar.  Neurologic: Denied neurology symptoms, dizziness, headache, neck pain and syncope.  Psychiatric: Denied psychiatric symptoms, anxiety and depression.  Endocrine: Denied endocrine symptoms including hot flashes and night sweats.   Meds:   No current outpatient medications on file prior to visit.   No current facility-administered medications on file prior to visit.     Objective:     Vitals:   04/09/19 0827  BP: 108/74  Pulse: 85    Physical examination   Pelvic:   Vulva: Normal appearance.  No lesions.  Vagina: No lesions or abnormalities noted.  Support: Normal pelvic support.  Urethra No masses tenderness or scarring.  Meatus Normal size without lesions or prolapse.  Cervix: Normal appearance.  No lesions.  Anus: Normal exam.  No lesions.  Perineum: Normal exam.  No lesions.        Bimanual   Uterus: Normal size.  Non-tender.  Mobile.  AV.  Adnexae: No masses.  Non-tender to palpation.  Cul-de-sac: Negative for abnormality.   IUD Procedure Pt has read the booklet and signed the appropriate forms regarding the Mirena IUD.  All of her questions have been answered.   The cervix was cleansed with betadine solution.  After sounding the uterus and noting the position, the IUD was placed in the usual manner without problem.  The string was cut to the appropriate length.  The patient tolerated the procedure well.  Assessment:    [id number] Patient Active Problem List   Diagnosis Date Noted  . Post-dates pregnancy 02/18/2019  . Influenza B 12/08/2018  . Anemia of pregnancy, third trimester 12/05/2018  . Current mild  episode of major depressive disorder without prior episode (HCC) 06/12/2018  . Anxiety 04/03/2018  . Situational depression 04/03/2018  . Stress headaches 04/03/2018  . Family history of diabetes mellitus 03/20/2018  . H/O trichomoniasis 12/14/2016  . ASCUS with positive high risk HPV 05/29/2016  . Morbid obesity with BMI of 40.0-44.9, adult (HCC) 04/26/2016  . H/O chlamydia infection 01/27/2016     1. Encounter for IUD insertion       Plan:             F/U  No follow-ups on file.  Elonda Huskyavid J. Julien Oscar, M.D. 04/09/2019 9:05 AM

## 2019-04-09 NOTE — Progress Notes (Signed)
Patient comes in today for IUD insertion. Denies having intercourse since baby was born. UPT negative today.

## 2019-07-30 ENCOUNTER — Telehealth: Payer: Self-pay | Admitting: Obstetrics and Gynecology

## 2019-07-30 NOTE — Telephone Encounter (Signed)
The patient is requesting to come in for a urine drop off the patient believes she has a UTI.Pt is requesting a call back,  Please advise.

## 2019-07-31 ENCOUNTER — Other Ambulatory Visit: Payer: Self-pay | Admitting: Surgical

## 2019-07-31 ENCOUNTER — Other Ambulatory Visit: Payer: Self-pay

## 2019-07-31 ENCOUNTER — Other Ambulatory Visit (INDEPENDENT_AMBULATORY_CARE_PROVIDER_SITE_OTHER): Payer: BLUE CROSS/BLUE SHIELD

## 2019-07-31 DIAGNOSIS — R300 Dysuria: Secondary | ICD-10-CM | POA: Diagnosis not present

## 2019-07-31 LAB — POCT URINALYSIS DIPSTICK
Bilirubin, UA: NEGATIVE
Glucose, UA: NEGATIVE
Ketones, UA: NEGATIVE
Nitrite, UA: NEGATIVE
Protein, UA: NEGATIVE
Spec Grav, UA: 1.01 (ref 1.010–1.025)
Urobilinogen, UA: 0.2 E.U./dL
pH, UA: 7.5 (ref 5.0–8.0)

## 2019-07-31 MED ORDER — NITROFURANTOIN MONOHYD MACRO 100 MG PO CAPS: 100.0000 mg | ORAL_CAPSULE | Freq: Two times a day (BID) | ORAL | 0 refills | Status: DC

## 2019-07-31 NOTE — Telephone Encounter (Signed)
Patient dropped off urine

## 2019-08-27 ENCOUNTER — Ambulatory Visit (INDEPENDENT_AMBULATORY_CARE_PROVIDER_SITE_OTHER): Payer: BC Managed Care – PPO

## 2019-08-27 ENCOUNTER — Other Ambulatory Visit: Payer: Self-pay

## 2019-08-27 ENCOUNTER — Other Ambulatory Visit: Payer: Self-pay | Admitting: Obstetrics and Gynecology

## 2019-08-27 DIAGNOSIS — Z30431 Encounter for routine checking of intrauterine contraceptive device: Secondary | ICD-10-CM | POA: Diagnosis not present

## 2019-09-24 ENCOUNTER — Encounter: Payer: Self-pay | Admitting: Obstetrics and Gynecology

## 2019-09-24 ENCOUNTER — Ambulatory Visit (INDEPENDENT_AMBULATORY_CARE_PROVIDER_SITE_OTHER): Payer: BC Managed Care – PPO | Admitting: Obstetrics and Gynecology

## 2019-09-24 ENCOUNTER — Other Ambulatory Visit: Payer: Self-pay

## 2019-09-24 VITALS — BP 126/79 | HR 98 | Wt 227.6 lb

## 2019-09-24 DIAGNOSIS — Z30430 Encounter for insertion of intrauterine contraceptive device: Secondary | ICD-10-CM | POA: Diagnosis not present

## 2019-09-24 DIAGNOSIS — N921 Excessive and frequent menstruation with irregular cycle: Secondary | ICD-10-CM

## 2019-09-24 NOTE — Progress Notes (Signed)
Patient comes today for IUD removal and replacement.

## 2019-09-24 NOTE — Progress Notes (Signed)
HPI:      Rhonda Davies is a 28 y.o. [phone] who LMP was No LMP recorded. (Menstrual status: Lactating).  Subjective:   She presents today for IUD insertion.  She has an IUD that has been causing irregular bleeding for several months.  An ultrasound reveals the IUD to appear low in the uterus likely not positioned high enough.  Patient likes her IUD but is tired of the daily bleeding.  She presents today for removal and reinsertion.    Hx: The following portions of the patient's history were reviewed and updated as appropriate:             She  has a past medical history of H/O chlamydia infection (01/27/2016), H/O trichomoniasis (12/14/2016), Kidney stone, Left groin pain, Personal history UTI, and Pregnancy induced hypertension. She does not have any pertinent problems on file. She  has a past surgical history that includes Tonsillectomy and adenoidectomy (2007) and CYST REMOVED (2013). Her family history includes Aneurysm in her paternal uncle; Anxiety disorder in her brother, father, and paternal aunt; Bipolar disorder in her paternal aunt; Diabetes in her paternal aunt and paternal grandmother; Heart disease in her father, maternal grandfather, and paternal grandfather. She  reports that she has never smoked. She has never used smokeless tobacco. She reports that she does not drink alcohol or use drugs. She has a current medication list which includes the following prescription(s): nitrofurantoin (macrocrystal-monohydrate). She is allergic to benzonatate.       Review of Systems:  Review of Systems  Constitutional: Denied constitutional symptoms, night sweats, recent illness, fatigue, fever, insomnia and weight loss.  Eyes: Denied eye symptoms, eye pain, photophobia, vision change and visual disturbance.  Ears/Nose/Throat/Neck: Denied ear, nose, throat or neck symptoms, hearing loss, nasal discharge, sinus congestion and sore throat.  Cardiovascular: Denied cardiovascular  symptoms, arrhythmia, chest pain/pressure, edema, exercise intolerance, orthopnea and palpitations.  Respiratory: Denied pulmonary symptoms, asthma, pleuritic pain, productive sputum, cough, dyspnea and wheezing.  Gastrointestinal: Denied, gastro-esophageal reflux, melena, nausea and vomiting.  Genitourinary: See HPI for additional information.  Musculoskeletal: Denied musculoskeletal symptoms, stiffness, swelling, muscle weakness and myalgia.  Dermatologic: Denied dermatology symptoms, rash and scar.  Neurologic: Denied neurology symptoms, dizziness, headache, neck pain and syncope.  Psychiatric: Denied psychiatric symptoms, anxiety and depression.  Endocrine: Denied endocrine symptoms including hot flashes and night sweats.   Meds:   Current Outpatient Medications on File Prior to Visit  Medication Sig Dispense Refill  . nitrofurantoin, macrocrystal-monohydrate, (MACROBID) 100 MG capsule Take 1 capsule (100 mg total) by mouth 2 (two) times daily. 14 capsule 0   No current facility-administered medications on file prior to visit.     Objective:     Vitals:   09/24/19 1558  BP: 126/79  Pulse: 98    Physical examination   Pelvic:   Vulva: Normal appearance.  No lesions.  Vagina: No lesions or abnormalities noted.  Support: Normal pelvic support.  Urethra No masses tenderness or scarring.  Meatus Normal size without lesions or prolapse.  Cervix: Normal appearance.  No lesions.  Anus: Normal exam.  No lesions.  Perineum: Normal exam.  No lesions.        Bimanual   Uterus: Normal size.  Non-tender.  Mobile.  AV.  Adnexae: No masses.  Non-tender to palpation.  Cul-de-sac: Negative for abnormality.    IUD Removal Strings of IUD identified and grasped.  IUD removed without problem.  Pt tolerated this well.  IUD noted to be intact.  IUD Procedure The cervix was cleansed with betadine solution.  After sounding the uterus and noting the position, the IUD was placed in the usual  manner without problem.  The string was cut to the appropriate length.  The patient tolerated the procedure well.              Assessment:    [id number] Patient Active Problem List   Diagnosis Date Noted  . Post-dates pregnancy 02/18/2019  . Influenza B 12/08/2018  . Anemia of pregnancy, third trimester 12/05/2018  . Current mild episode of major depressive disorder without prior episode ([place]) 06/12/2018  . Anxiety 04/03/2018  . Situational depression 04/03/2018  . Stress headaches 04/03/2018  . Family history of diabetes mellitus 03/20/2018  . H/O trichomoniasis 12/14/2016  . ASCUS with positive high risk HPV 05/29/2016  . Morbid obesity with BMI of 40.0-44.9, adult ([place]) 04/26/2016  . H/O chlamydia infection 01/27/2016     1. Breakthrough bleeding associated with intrauterine device (IUD)       Plan:             F/U  Return in about 4 weeks (around 10/22/2019) for For IUD f/u.  Finis Bud, M.D. 09/24/2019 4:22 PM

## 2019-10-17 ENCOUNTER — Encounter: Payer: Self-pay | Admitting: Family Medicine

## 2019-10-22 ENCOUNTER — Other Ambulatory Visit: Payer: Self-pay

## 2019-10-22 ENCOUNTER — Ambulatory Visit (INDEPENDENT_AMBULATORY_CARE_PROVIDER_SITE_OTHER): Payer: BC Managed Care – PPO | Admitting: Obstetrics and Gynecology

## 2019-10-22 ENCOUNTER — Encounter: Payer: Self-pay | Admitting: Obstetrics and Gynecology

## 2019-10-22 VITALS — BP 114/78 | HR 93 | Ht 64.0 in | Wt 226.3 lb

## 2019-10-22 DIAGNOSIS — N921 Excessive and frequent menstruation with irregular cycle: Secondary | ICD-10-CM

## 2019-10-22 DIAGNOSIS — Z975 Presence of (intrauterine) contraceptive device: Secondary | ICD-10-CM | POA: Diagnosis not present

## 2019-10-22 DIAGNOSIS — Z30431 Encounter for routine checking of intrauterine contraceptive device: Secondary | ICD-10-CM | POA: Diagnosis not present

## 2019-10-22 NOTE — Progress Notes (Signed)
HPI:      [name] is a 28 y.o. [phone] who LMP was No LMP recorded. (Menstrual status: IUD).  Subjective:   She presents today for follow-up of her IUD.  This is an IUD that replaced another that was causing irregular monthly bleeding.  Patient says that she had 2 weeks of bleeding after insertion but has had no bleeding since.  She has had intercourse without issue.  She says that she "likes this 1 a lot better".    Hx: The following portions of the patient's history were reviewed and updated as appropriate:             She  has a past medical history of H/O chlamydia infection (01/27/2016), H/O trichomoniasis (12/14/2016), Kidney stone, Left groin pain, Personal history UTI, and Pregnancy induced hypertension. She does not have any pertinent problems on file. She  has a past surgical history that includes Tonsillectomy and adenoidectomy (2007) and CYST REMOVED (2013). Her family history includes Aneurysm in her paternal uncle; Anxiety disorder in her brother, father, and paternal aunt; Bipolar disorder in her paternal aunt; Diabetes in her paternal aunt and paternal grandmother; Heart disease in her father, maternal grandfather, and paternal grandfather. She  reports that she has never smoked. She has never used smokeless tobacco. She reports that she does not drink alcohol or use drugs. She currently has no medications in their medication list. She is allergic to benzonatate.       Review of Systems:  Review of Systems  Constitutional: Denied constitutional symptoms, night sweats, recent illness, fatigue, fever, insomnia and weight loss.  Eyes: Denied eye symptoms, eye pain, photophobia, vision change and visual disturbance.  Ears/Nose/Throat/Neck: Denied ear, nose, throat or neck symptoms, hearing loss, nasal discharge, sinus congestion and sore throat.  Cardiovascular: Denied cardiovascular symptoms, arrhythmia, chest pain/pressure, edema, exercise intolerance, orthopnea  and palpitations.  Respiratory: Denied pulmonary symptoms, asthma, pleuritic pain, productive sputum, cough, dyspnea and wheezing.  Gastrointestinal: Denied, gastro-esophageal reflux, melena, nausea and vomiting.  Genitourinary: Denied genitourinary symptoms including symptomatic vaginal discharge, pelvic relaxation issues, and urinary complaints.  Musculoskeletal: Denied musculoskeletal symptoms, stiffness, swelling, muscle weakness and myalgia.  Dermatologic: Denied dermatology symptoms, rash and scar.  Neurologic: Denied neurology symptoms, dizziness, headache, neck pain and syncope.  Psychiatric: Denied psychiatric symptoms, anxiety and depression.  Endocrine: Denied endocrine symptoms including hot flashes and night sweats.   Meds:   No current outpatient medications on file prior to visit.   No current facility-administered medications on file prior to visit.     Objective:     Vitals:   10/22/19 0806  BP: 114/78  Pulse: 93              Physical examination   Pelvic:   Vulva: Normal appearance.  No lesions.  Vagina: No lesions or abnormalities noted.  Support: Normal pelvic support.  Urethra No masses tenderness or scarring.  Meatus Normal size without lesions or prolapse.  Cervix: Normal appearance.  No lesions. IUD strings noted at cervical os.  Anus: Normal exam.  No lesions.  Perineum: Normal exam.  No lesions.        Bimanual   Uterus: Normal size.  Non-tender.  Mobile.  AV.  Adnexae: No masses.  Non-tender to palpation.  Cul-de-sac: Negative for abnormality.     Assessment:    [id number] Patient Active Problem List   Diagnosis Date Noted  . Post-dates pregnancy 02/18/2019  . Influenza B 12/08/2018  . Anemia of pregnancy,  third trimester 12/05/2018  . Current mild episode of major depressive disorder without prior episode (HCC) 06/12/2018  . Anxiety 04/03/2018  . Situational depression 04/03/2018  . Stress headaches 04/03/2018  . Family history of  diabetes mellitus 03/20/2018  . H/O trichomoniasis 12/14/2016  . ASCUS with positive high risk HPV 05/29/2016  . Morbid obesity with BMI of 40.0-44.9, adult (HCC) 04/26/2016  . H/O chlamydia infection 01/27/2016     1. Surveillance of previously prescribed intrauterine contraceptive device   2. Breakthrough bleeding associated with intrauterine device (IUD)     Breakthrough bleeding seems to be resolved with current location of new IUD.   Plan:            1.  Follow-up for annual examination Orders No orders of the defined types were placed in this encounter.   No orders of the defined types were placed in this encounter.     F/U  Return in about 4 months (around 02/22/2020) for Annual Physical. I spent 17 minutes involved in the care of this patient of which greater than 50% was spent discussing IUD, breakthrough bleeding with IUD, headaches, intercourse issues with IUD.  [name], M.D. 10/22/2019 8:32 AM

## 2019-11-06 ENCOUNTER — Other Ambulatory Visit: Payer: Self-pay

## 2019-11-06 ENCOUNTER — Ambulatory Visit (INDEPENDENT_AMBULATORY_CARE_PROVIDER_SITE_OTHER): Payer: BC Managed Care – PPO | Admitting: Family Medicine

## 2019-11-06 ENCOUNTER — Encounter: Payer: Self-pay | Admitting: Family Medicine

## 2019-11-06 DIAGNOSIS — F320 Major depressive disorder, single episode, mild: Secondary | ICD-10-CM

## 2019-11-06 DIAGNOSIS — F419 Anxiety disorder, unspecified: Secondary | ICD-10-CM | POA: Diagnosis not present

## 2019-11-06 MED ORDER — ESCITALOPRAM OXALATE 10 MG PO TABS: ORAL_TABLET | ORAL | 1 refills | Status: DC

## 2019-11-06 NOTE — Progress Notes (Signed)
Name: Rhonda Davies   MRN: [id number]    DOB: 11/23/1991   Date:11/06/2019       Progress Note  Subjective  Chief Complaint  Chief Complaint  Patient presents with  . Depression    dicuss going back on Lexapro. Been off 1 year since child birth    I connected with  [name]  on 11/06/19 at  9:20 AM EST by a video enabled telemedicine application and verified that I am speaking with the correct person using two identifiers.  I discussed the limitations of evaluation and management by telemedicine and the availability of in person appointments. The patient expressed understanding and agreed to proceed. Staff also discussed with the patient that there may be a patient responsible charge related to this service. Patient Location: Home Provider Location: Home Office Additional Individuals present: None  HPI  Pt presents with concern for depression recurrence.  She has 13mo at home who is formula fed and on solids - she is not breastfeeding.  She also has a 26yo at home.  She denies SI/HI, she feels safe caring for her children.  She notes that she has had similar symptoms in the past prior to using Lexapro.  She works full time, in school full time ([employer] - started back in June). She notes that she has trouble controlling her irritability at times, has some crying spells. No panic attacks.   Patient Active Problem List   Diagnosis Date Noted  . Post-dates pregnancy 02/18/2019  . Influenza B 12/08/2018  . Anemia of pregnancy, third trimester 12/05/2018  . Current mild episode of major depressive disorder without prior episode (HCC) 06/12/2018  . Anxiety 04/03/2018  . Situational depression 04/03/2018  . Stress headaches 04/03/2018  . Family history of diabetes mellitus 03/20/2018  . H/O trichomoniasis 12/14/2016  . ASCUS with positive high risk HPV 05/29/2016  . Morbid obesity with BMI of 40.0-44.9, adult (HCC) 04/26/2016  . H/O chlamydia infection  01/27/2016    Past Surgical History:  Procedure Laterality Date  . CYST REMOVED  2013   IN NECK  . TONSILLECTOMY AND ADENOIDECTOMY  2007    Family History  Problem Relation Age of Onset  . Heart disease Father   . Anxiety disorder Father   . Heart disease Maternal Grandfather   . Diabetes Paternal Grandmother   . Heart disease Paternal Grandfather   . Anxiety disorder Paternal Aunt   . Bipolar disorder Paternal Aunt   . Diabetes Paternal Aunt   . Anxiety disorder Brother   . Aneurysm Paternal Uncle   . Breast cancer Neg Hx   . Colon cancer Neg Hx   . Ovarian cancer Neg Hx     Social History   Socioeconomic History  . Marital status: Significant Other    Spouse name: Not on file  . Number of children: 1  . Years of education: Not on file  . Highest education level: 12th grade  Occupational History  . Not on file  Social Needs  . Financial resource strain: Not hard at all  . Food insecurity    Worry: Never true    Inability: Never true  . Transportation needs    Medical: No    Non-medical: No  Tobacco Use  . Smoking status: Never Smoker  . Smokeless tobacco: Never Used  Substance and Sexual Activity  . Alcohol use: No    Alcohol/week: 1.0 standard drinks    Types: 1 Standard drinks or equivalent per  week    Frequency: Never  . Drug use: No  . Sexual activity: Yes    Partners: Male    Birth control/protection: Condom  Lifestyle  . Physical activity    Days per week: 2 days    Minutes per session: 30 min  . Stress: Rather much  Relationships  . Social connections    Talks on phone: More than three times a week    Gets together: More than three times a week    Attends religious service: Never    Active member of club or organization: No    Attends meetings of clubs or organizations: Never    Relationship status: Never married  . Intimate partner violence    Fear of current or ex partner: No    Emotionally abused: No    Physically abused: No     Forced sexual activity: No  Other Topics Concern  . Not on file  Social History Narrative   Single.    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     No current outpatient medications on file.  Allergies  Allergen Reactions  . Benzonatate Swelling    I personally reviewed active problem list, medication list, allergies, notes from last encounter, lab results with the patient/caregiver today.   ROS  Constitutional: Negative for fever or weight change.  Respiratory: Negative for cough and shortness of breath.   Cardiovascular: Negative for chest pain or palpitations.  Gastrointestinal: Negative for abdominal pain, no bowel changes.  Musculoskeletal: Negative for gait problem or joint swelling.  Skin: Negative for rash.  Neurological: Negative for dizziness or headache.  No other specific complaints in a complete review of systems (except as listed in HPI above).  Objective  Virtual encounter, vitals not obtained.  There is no height or weight on file to calculate BMI.  Physical Exam  Constitutional: Patient appears well-developed and well-nourished. No distress.  HENT: Head: Normocephalic and atraumatic.  Neck: Normal range of motion. Pulmonary/Chest: Effort normal. No respiratory distress. Speaking in complete sentences Neurological: Pt is alert and oriented to person, place, and time. Coordination, speech are normal. Psychiatric: Patient has a normal mood and affect. behavior is normal. Judgment and thought content normal.  No results found for this or any previous visit (from the past 72 hour(s)).  PHQ2/9: Depression screen [facility] 2/9 11/06/2019 04/01/2019 06/12/2018 05/07/2018 05/07/2018  Decreased Interest 1 0 1 - 3  Down, Depressed, Hopeless 1 1 2  - 3  PHQ - 2 Score 2 1 3  - 6  Altered sleeping 1 1 3  - 3  Tired, decreased energy 1 0 2 - 3  Change in appetite 0 0 2 - 3  Feeling bad or failure about  yourself  0 0 2 - 3  Trouble concentrating 0 0 1 - 3  Moving slowly or fidgety/restless 0 0 0 - 3  Suicidal thoughts 0 0 0 0 0  PHQ-9 Score 4 2 13  - 24  Difficult doing work/chores Not difficult at all Not difficult at all Somewhat difficult - Very difficult   PHQ-2/9 Result is positive.    Fall Risk: Fall Risk  11/06/2019 05/07/2018 03/20/2018  Falls in the past year? 0 No No  Number falls in past yr: 0 - -  Injury with Fall? 0 - -  Follow up Falls evaluation completed - -    Assessment & Plan  1. Anxiety -  We will restart Lexapro as she has done well on this in the past.  She has some support from her mom and spouse.  She is not breastfeeding. - escitalopram (LEXAPRO) 10 MG tablet; Take 1/2 tablet once daily for 7 days, then increase to 1 tablet once daily  Dispense: 30 tablet; Refill: 1  2. Current mild episode of major depressive disorder without prior episode (HCC) - escitalopram (LEXAPRO) 10 MG tablet; Take 1/2 tablet once daily for 7 days, then increase to 1 tablet once daily  Dispense: 30 tablet; Refill: 1  I discussed the assessment and treatment plan with the patient. The patient was provided an opportunity to ask questions and all were answered. The patient agreed with the plan and demonstrated an understanding of the instructions.  The patient was advised to call back or seek an in-person evaluation if the symptoms worsen or if the condition fails to improve as anticipated.  I provided 12 minutes of non-face-to-face time during this encounter.

## 2019-12-02 ENCOUNTER — Encounter: Payer: Self-pay | Admitting: Family Medicine

## 2019-12-09 ENCOUNTER — Encounter: Payer: Self-pay | Admitting: Family Medicine

## 2019-12-09 ENCOUNTER — Ambulatory Visit (INDEPENDENT_AMBULATORY_CARE_PROVIDER_SITE_OTHER): Payer: BC Managed Care – PPO | Admitting: Family Medicine

## 2019-12-09 ENCOUNTER — Other Ambulatory Visit: Payer: Self-pay

## 2019-12-09 DIAGNOSIS — F419 Anxiety disorder, unspecified: Secondary | ICD-10-CM

## 2019-12-09 DIAGNOSIS — F320 Major depressive disorder, single episode, mild: Secondary | ICD-10-CM

## 2019-12-09 MED ORDER — ESCITALOPRAM OXALATE 10 MG PO TABS: ORAL_TABLET | ORAL | 1 refills | Status: DC

## 2019-12-09 MED ORDER — ESCITALOPRAM OXALATE 10 MG PO TABS: 10.0000 mg | ORAL_TABLET | Freq: Every day | ORAL | 1 refills | Status: DC

## 2019-12-09 NOTE — Progress Notes (Signed)
Name: Rhonda Davies   MRN: [id number]    DOB: 17-Jan-1991   Date:12/09/2019       Progress Note  Subjective  Chief Complaint  Chief Complaint  Patient presents with  . Follow-up       . Depression    restarted Lexapro    I connected with  Rhonda Davies  on 12/09/19 at  9:20 AM EST by a video enabled telemedicine application and verified that I am speaking with the correct person using two identifiers.  I discussed the limitations of evaluation and management by telemedicine and the availability of in person appointments. The patient expressed understanding and agreed to proceed. Staff also discussed with the patient that there may be a patient responsible charge related to this service. Patient Location: Home Provider Location: Office Additional Individuals present: None  HPI  Pt presents for depression and anxiety follow up - she was restarted on 10mg  lexapro on 11/06/2019 after noting recurrence of her depression symptoms.  Since then, she has been taking the Lexapro 10mg  and has noticed improvement in her irritability and much fewer crying spells.  She denies headaches, nausea; no SI/HI. PHQ-9 score is essentially unchanged from 1 month ago, however she reports significant improvement in symptoms.  - Patient also requests a concealed carry letter for her license.  She has held a concealed carry license for years and has never had any issues.  She states she has never had any suicidal or homicidal ideation, she has never had thoughts of self harm or harming of others.  Uses concealed carry for protection.  Interim history from 11/06/2019: Pt presents with concern for depression recurrence.  She has 60mo at home who is formula fed and on solids - she is not breastfeeding.  She also has a 26yo at home.  She denies SI/HI, she feels safe caring for her children.  She notes that she has had similar symptoms in the past prior to using Lexapro.  She works full time, in school full  time ([employer] - started back in June). She notes that she has trouble controlling her irritability at times, has some crying spells. No panic attacks.    Patient Active Problem List   Diagnosis Date Noted  . Post-dates pregnancy 02/18/2019  . Current mild episode of major depressive disorder without prior episode ([place]) 06/12/2018  . Anxiety 04/03/2018  . Stress headaches 04/03/2018  . Family history of diabetes mellitus 03/20/2018  . H/O trichomoniasis 12/14/2016  . ASCUS with positive high risk HPV 05/29/2016  . Morbid obesity with BMI of 40.0-44.9, adult ([place]) 04/26/2016  . H/O chlamydia infection 01/27/2016    Past Surgical History:  Procedure Laterality Date  . CYST REMOVED  2013   IN NECK  . TONSILLECTOMY AND ADENOIDECTOMY  2007    Family History  Problem Relation Age of Onset  . Heart disease Father   . Anxiety disorder Father   . Heart disease Maternal Grandfather   . Diabetes Paternal Grandmother   . Heart disease Paternal Grandfather   . Anxiety disorder Paternal Aunt   . Bipolar disorder Paternal Aunt   . Diabetes Paternal Aunt   . Anxiety disorder Brother   . Aneurysm Paternal Uncle   . Breast cancer Neg Hx   . Colon cancer Neg Hx   . Ovarian cancer Neg Hx     Social History   Socioeconomic History  . Marital status: Significant Other    Spouse name: Not on file  .  Number of children: 1  . Years of education: Not on file  . Highest education level: 12th grade  Occupational History  . Not on file  Tobacco Use  . Smoking status: Never Smoker  . Smokeless tobacco: Never Used  Substance and Sexual Activity  . Alcohol use: No    Alcohol/week: 1.0 standard drinks    Types: 1 Standard drinks or equivalent per week  . Drug use: No  . Sexual activity: Yes    Partners: Male    Birth control/protection: Condom  Other Topics Concern  . Not on file  Social History Narrative   Single.    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    Social Determinants of Health   Financial Resource Strain:   . Difficulty of Paying Living Expenses: Not on file  Food Insecurity:   . Worried About Programme researcher, broadcasting/film/videounning Out of Food in the Last Year: Not on file  . Ran Out of Food in the Last Year: Not on file  Transportation Needs:   . Lack of Transportation (Medical): Not on file  . Lack of Transportation (Non-Medical): Not on file  Physical Activity:   . Days of Exercise per Week: Not on file  . Minutes of Exercise per Session: Not on file  Stress:   . Feeling of Stress : Not on file  Social Connections:   . Frequency of Communication with Friends and Family: Not on file  . Frequency of Social Gatherings with Friends and Family: Not on file  . Attends Religious Services: Not on file  . Active Member of Clubs or Organizations: Not on file  . Attends BankerClub or Organization Meetings: Not on file  . Marital Status: Not on file  Intimate Partner Violence:   . Fear of Current or Ex-Partner: Not on file  . Emotionally Abused: Not on file  . Physically Abused: Not on file  . Sexually Abused: Not on file     Current Outpatient Medications:  .  escitalopram (LEXAPRO) 10 MG tablet, Take 1/2 tablet once daily for 7 days, then increase to 1 tablet once daily, Disp: 30 tablet, Rfl: 1  Allergies  Allergen Reactions  . Benzonatate Swelling    I personally reviewed active problem list, medication list, allergies, notes from last encounter with the patient/caregiver today.   ROS  Ten systems reviewed and is negative except as mentioned in HPI   Objective  Virtual encounter, vitals not obtained.  There is no height or weight on file to calculate BMI.  Physical Exam Constitutional: Patient appears well-developed and well-nourished. No distress.  HENT: Head: Normocephalic and atraumatic.  Neck: Normal range of motion. Pulmonary/Chest: Effort normal. No respiratory distress. Speaking in  complete sentences Neurological: Pt is alert and oriented to person, place, and time. Coordination, speech are normal. Psychiatric: Patient has a normal mood and affect. behavior is normal. Judgment and thought content normal.  No results found for this or any previous visit (from the past 72 hour(s)).  PHQ2/9: [facility] 12/09/2019 11/06/2019 04/01/2019 06/12/2018 05/07/2018  Decreased Interest 1 1 0 1 -  Down, Depressed, Hopeless 0 1 1 2  -  PHQ - 2 Score 1 2 1 3  -  Altered sleeping 2 1 1 3  -  Tired, decreased energy 1 1 0 2 -  Change in appetite 0 0 0 2 -  Feeling bad or failure about yourself  0 0 0 2 -  Trouble concentrating 1 0 0 1 -  Moving slowly or fidgety/restless 0 0 0 0 -  Suicidal thoughts 0 0 0 0 0  PHQ-9 Score 5 4 2 13  -  Difficult doing work/chores Somewhat difficult Not difficult at all Not difficult at all Somewhat difficult -   PHQ-2/9 Result is positive.    Fall Risk: Fall Risk  12/09/2019 11/06/2019 05/07/2018 03/20/2018  Falls in the past year? 0 0 No No  Number falls in past yr: 0 0 - -  Injury with Fall? 0 0 - -  Follow up Falls evaluation completed Falls evaluation completed - -    Assessment & Plan  1. Anxiety - Much improved with Lexapro; maintain current dose - escitalopram (LEXAPRO) 10 MG tablet; Take 1  Dispense: 90 tablet; Refill: 1  2. Current mild episode of major depressive disorder without prior episode ([facility]) - PHQ-9 score does not show improvement, however she reports significant improvement in her mood, irritability, ability to control emotions, and crying spells.  Will maintain dose and follow up in 3-4 months. - escitalopram (LEXAPRO) 10 MG tablet; Take 1 tablet once daily Dispense: 90 tablet; Refill: 1  I discussed the assessment and treatment plan with the patient. The patient was provided an opportunity to ask questions and all were answered. The patient agreed with the plan and demonstrated an understanding of the  instructions.  The patient was advised to call back or seek an in-person evaluation if the symptoms worsen or if the condition fails to improve as anticipated.  I provided 10 minutes of non-face-to-face time during this encounter.

## 2020-02-25 ENCOUNTER — Other Ambulatory Visit: Payer: Self-pay

## 2020-02-25 ENCOUNTER — Other Ambulatory Visit (HOSPITAL_COMMUNITY)
Admission: RE | Admit: 2020-02-25 | Discharge: 2020-02-25 | Disposition: A | Discharge status: Home / Self Care | Payer: BC Managed Care – PPO | Source: Ambulatory Visit | Attending: Obstetrics and Gynecology | Admitting: Obstetrics and Gynecology

## 2020-02-25 ENCOUNTER — Ambulatory Visit (INDEPENDENT_AMBULATORY_CARE_PROVIDER_SITE_OTHER): Payer: BC Managed Care – PPO | Admitting: Obstetrics and Gynecology

## 2020-02-25 ENCOUNTER — Encounter: Payer: Self-pay | Admitting: Obstetrics and Gynecology

## 2020-02-25 VITALS — BP 112/71 | HR 96 | Ht 64.0 in | Wt 241.6 lb

## 2020-02-25 DIAGNOSIS — Z01419 Encounter for gynecological examination (general) (routine) without abnormal findings: Secondary | ICD-10-CM | POA: Diagnosis not present

## 2020-02-25 DIAGNOSIS — Z202 Contact with and (suspected) exposure to infections with a predominantly sexual mode of transmission: Secondary | ICD-10-CM | POA: Insufficient documentation

## 2020-02-25 NOTE — Progress Notes (Signed)
HPI:      [name] is a 29 y.o. Rhonda Davies who LMP was Patient's last menstrual period was 02/23/2020.  Subjective:   She presents today for her annual examination.  She feels well.  She has no complaints.  She has an IUD for birth control.  She is not experiencing any problems with that.  Her last IUD was regularly/constantly problematic but this one is much better.    Hx: The following portions of the patient's history were reviewed and updated as appropriate:             She  has a past medical history of Anxiety, H/O chlamydia infection (01/27/2016), H/O trichomoniasis (12/14/2016), Kidney stone, Left groin pain, Personal history UTI, and Pregnancy induced hypertension. She does not have any pertinent problems on file. She  has a past surgical history that includes Tonsillectomy and adenoidectomy (2007) and CYST REMOVED (2013). Her family history includes Aneurysm in her paternal uncle; Anxiety disorder in her brother, father, and paternal aunt; Bipolar disorder in her paternal aunt; Diabetes in her paternal aunt and paternal grandmother; Heart disease in her father, maternal grandfather, and paternal grandfather. She  reports that she has never smoked. She has never used smokeless tobacco. She reports that she does not drink alcohol or use drugs. She has a current medication list which includes the following prescription(s): escitalopram and levonorgestrel. She is allergic to benzonatate.       Review of Systems:  Review of Systems  Constitutional: Denied constitutional symptoms, night sweats, recent illness, fatigue, fever, insomnia and weight loss.  Eyes: Denied eye symptoms, eye pain, photophobia, vision change and visual disturbance.  Ears/Nose/Throat/Neck: Denied ear, nose, throat or neck symptoms, hearing loss, nasal discharge, sinus congestion and sore throat.  Cardiovascular: Denied cardiovascular symptoms, arrhythmia, chest pain/pressure, edema, exercise intolerance,  orthopnea and palpitations.  Respiratory: Denied pulmonary symptoms, asthma, pleuritic pain, productive sputum, cough, dyspnea and wheezing.  Gastrointestinal: Denied, gastro-esophageal reflux, melena, nausea and vomiting.  Genitourinary: Denied genitourinary symptoms including symptomatic vaginal discharge, pelvic relaxation issues, and urinary complaints.  Musculoskeletal: Denied musculoskeletal symptoms, stiffness, swelling, muscle weakness and myalgia.  Dermatologic: Denied dermatology symptoms, rash and scar.  Neurologic: Denied neurology symptoms, dizziness, headache, neck pain and syncope.  Psychiatric: Denied psychiatric symptoms, anxiety and depression.  Endocrine: Denied endocrine symptoms including hot flashes and night sweats.   Meds:   Current Outpatient Medications on File Prior to Visit  Medication Sig Dispense Refill  . escitalopram (LEXAPRO) 10 MG tablet Take 1 tablet (10 mg total) by mouth daily. 90 tablet 1  . levonorgestrel (MIRENA) 20 MCG/24HR IUD 1 each by Intrauterine route once.     No current facility-administered medications on file prior to visit.    Objective:     Vitals:   02/25/20 1521  BP: 112/71  Pulse: 96              Physical examination General NAD, Conversant  HEENT Atraumatic; Op clear with mmm.  Normo-cephalic. Pupils reactive. Anicteric sclerae  Thyroid/Neck Smooth without nodularity or enlargement. Normal ROM.  Neck Supple.  Skin No rashes, lesions or ulceration. Normal palpated skin turgor. No nodularity.  Breasts: No masses or discharge.  Symmetric.  No axillary adenopathy.  Lungs: Clear to auscultation.No rales or wheezes. Normal Respiratory effort, no retractions.  Heart: NSR.  No murmurs or rubs appreciated. No periferal edema  Abdomen: Soft.  Non-tender.  No masses.  No HSM. No hernia  Extremities: Moves all appropriately.  Normal ROM  for age. No lymphadenopathy.  Neuro: Oriented to PPT.  Normal mood. Normal affect.     Pelvic:    Vulva: Normal appearance.  No lesions.  Vagina: No lesions or abnormalities noted.  Support: Normal pelvic support.  Urethra No masses tenderness or scarring.  Meatus Normal size without lesions or prolapse.  Cervix: Normal appearance.  No lesions.  IUD strings noted at cervical os.  And shortened  Anus: Normal exam.  No lesions.  Perineum: Normal exam.  No lesions.        Bimanual   Uterus: Normal size.  Non-tender.  Mobile.  AV.  Adnexae: No masses.  Non-tender to palpation.  Cul-de-sac: Negative for abnormality.      Assessment:    Rhonda Davies Patient Active Problem List   Diagnosis Date Noted  . Post-dates pregnancy 02/18/2019  . Current mild episode of major depressive disorder without prior episode ([facility]) 06/12/2018  . Anxiety 04/03/2018  . Stress headaches 04/03/2018  . Family history of diabetes mellitus 03/20/2018  . H/O trichomoniasis 12/14/2016  . ASCUS with positive high risk HPV 05/29/2016  . Morbid obesity with BMI of 40.0-44.9, adult ([facility]) 04/26/2016  . H/O chlamydia infection 01/27/2016     1. Well woman exam with routine gynecological exam        Plan:            1.  Basic Screening Recommendations The basic screening recommendations for asymptomatic women were discussed with the patient during her visit.  The age-appropriate recommendations were discussed with her and the rational for the tests reviewed.  When I am informed by the patient that another primary care physician has previously obtained the age-appropriate tests and they are up-to-date, only outstanding tests are ordered and referrals given as necessary.  Abnormal results of tests will be discussed with her when all of her results are completed.  Routine preventative health maintenance measures emphasized: Exercise/Diet/Weight control, Tobacco Warnings, Alcohol/Substance use risks and Stress Management GC/CT -mammogram next year Orders No orders of the defined types were placed in this  encounter.   No orders of the defined types were placed in this encounter.       F/U  Return in about 1 year (around 02/24/2021) for Annual Physical.  [name], M.D. 02/25/2020 3:35 PM

## 2020-02-28 LAB — GC/CHLAMYDIA PROBE AMP (~~LOC~~) NOT AT ARMC
Chlamydia: NEGATIVE
Comment: NEGATIVE
Comment: NORMAL
Neisseria Gonorrhea: NEGATIVE

## 2020-04-03 ENCOUNTER — Encounter: Payer: Self-pay | Admitting: Family Medicine

## 2020-04-08 ENCOUNTER — Ambulatory Visit: Payer: BC Managed Care – PPO | Admitting: Family Medicine

## 2020-04-15 ENCOUNTER — Other Ambulatory Visit: Payer: Self-pay

## 2020-04-15 ENCOUNTER — Telehealth (INDEPENDENT_AMBULATORY_CARE_PROVIDER_SITE_OTHER): Payer: BC Managed Care – PPO | Admitting: Family Medicine

## 2020-04-15 ENCOUNTER — Encounter: Payer: Self-pay | Admitting: Family Medicine

## 2020-04-15 VITALS — BP 166/85 | HR 97 | Ht 64.0 in | Wt 241.0 lb

## 2020-04-15 DIAGNOSIS — F439 Reaction to severe stress, unspecified: Secondary | ICD-10-CM

## 2020-04-15 DIAGNOSIS — F320 Major depressive disorder, single episode, mild: Secondary | ICD-10-CM | POA: Diagnosis not present

## 2020-04-15 DIAGNOSIS — R030 Elevated blood-pressure reading, without diagnosis of hypertension: Secondary | ICD-10-CM | POA: Diagnosis not present

## 2020-04-15 DIAGNOSIS — F419 Anxiety disorder, unspecified: Secondary | ICD-10-CM

## 2020-04-15 DIAGNOSIS — Z8249 Family history of ischemic heart disease and other diseases of the circulatory system: Secondary | ICD-10-CM

## 2020-04-15 DIAGNOSIS — Z8759 Personal history of other complications of pregnancy, childbirth and the puerperium: Secondary | ICD-10-CM

## 2020-04-15 MED ORDER — BUSPIRONE HCL 5 MG PO TABS: 5.0000 mg | ORAL_TABLET | Freq: Two times a day (BID) | ORAL | 2 refills | Status: DC | PRN

## 2020-04-15 MED ORDER — ESCITALOPRAM OXALATE 20 MG PO TABS: 20.0000 mg | ORAL_TABLET | Freq: Every day | ORAL | 3 refills | Status: DC

## 2020-04-15 NOTE — Patient Instructions (Addendum)
Increase your lexapro to 20 mg daily (you can finish what you have of the 10 mg by taking 2 pills at the same time)  Try buspar 5-10 mg up to three times a day as needed for anxiety, stress, irritability or agitation.   This can be daily if you feel like its helpful and effective, or you can try on more stressful days.  Buspar is in addition to your lexapro.  If has flexible dosing.  Can try 5 mg once a day up to 60 mg in divided doses with max total dose 60 in 24 hours (usually high dose is 20 mg dose three times a day).  Most women have told me that 5- 10 mg once or twice a day has been helpful for their symptoms. See info below on managing anxiety symptoms. Let me know if you want a referral to specialist or therapist.  Monitor your blood pressure several times a day when relaxed at home please see the below handout on how to monitor your blood pressure at home. Send me in your blood pressure readings about 1 week from now. If your blood pressure readings are high I will want to have you come into the office to check your blood pressure here and compare to your blood pressure cuff before starting medications.  Managing Anxiety, Adult After being diagnosed with an anxiety disorder, you may be relieved to know why you have felt or behaved a certain way. You may also feel overwhelmed about the treatment ahead and what it will mean for your life. With care and support, you can manage this condition and recover from it. How to manage lifestyle changes Managing stress and anxiety  Stress is your body's reaction to life changes and events, both good and bad. Most stress will last just a few hours, but stress can be ongoing and can lead to more than just stress. Although stress can play a major role in anxiety, it is not the same as anxiety. Stress is usually caused by something external, such as a deadline, test, or competition. Stress normally passes after the triggering event has ended.  Anxiety  is caused by something internal, such as imagining a terrible outcome or worrying that something will go wrong that will devastate you. Anxiety often does not go away even after the triggering event is over, and it can become long-term (chronic) worry. It is important to understand the differences between stress and anxiety and to manage your stress effectively so that it does not lead to an anxious response. Talk with your health care provider or a counselor to learn more about reducing anxiety and stress. He or she may suggest tension reduction techniques, such as:  Music therapy. This can include creating or listening to music that you enjoy and that inspires you.  Mindfulness-based meditation. This involves being aware of your normal breaths while not trying to control your breathing. It can be done while sitting or walking.  Centering prayer. This involves focusing on a word, phrase, or sacred image that means something to you and brings you peace.  Deep breathing. To do this, expand your stomach and inhale slowly through your nose. Hold your breath for 3-5 seconds. Then exhale slowly, letting your stomach muscles relax.  Self-talk. This involves identifying thought patterns that lead to anxiety reactions and changing those patterns.  Muscle relaxation. This involves tensing muscles and then relaxing them. Choose a tension reduction technique that suits your lifestyle and personality. These techniques take  time and practice. Set aside 5-15 minutes a day to do them. Therapists can offer counseling and training in these techniques. The training to help with anxiety may be covered by some insurance plans. Other things you can do to manage stress and anxiety include:  Keeping a stress/anxiety diary. This can help you learn what triggers your reaction and then learn ways to manage your response.  Thinking about how you react to certain situations. You may not be able to control everything, but you  can control your response.  Making time for activities that help you relax and not feeling guilty about spending your time in this way.  Visual imagery and yoga can help you stay calm and relax.  Medicines Medicines can help ease symptoms. Medicines for anxiety include:  Anti-anxiety drugs.  Antidepressants. Medicines are often used as a primary treatment for anxiety disorder. Medicines will be prescribed by a health care provider. When used together, medicines, psychotherapy, and tension reduction techniques may be the most effective treatment. Relationships Relationships can play a big part in helping you recover. Try to spend more time connecting with trusted friends and family members. Consider going to couples counseling, taking family education classes, or going to family therapy. Therapy can help you and others better understand your condition. How to recognize changes in your anxiety Everyone responds differently to treatment for anxiety. Recovery from anxiety happens when symptoms decrease and stop interfering with your daily activities at home or work. This may mean that you will start to:  Have better concentration and focus. Worry will interfere less in your daily thinking.  Sleep better.  Be less irritable.  Have more energy.  Have improved memory. It is important to recognize when your condition is getting worse. Contact your health care provider if your symptoms interfere with home or work and you feel like your condition is not improving. Follow these instructions at home: Activity  Exercise. Most adults should do the following: ? Exercise for at least 150 minutes each week. The exercise should increase your heart rate and make you sweat (moderate-intensity exercise). ? Strengthening exercises at least twice a week.  Get the right amount and quality of sleep. Most adults need 7-9 hours of sleep each night. Lifestyle   Eat a healthy diet that includes plenty of  vegetables, fruits, whole grains, low-fat dairy products, and lean protein. Do not eat a lot of foods that are high in solid fats, added sugars, or salt.  Make choices that simplify your life.  Do not use any products that contain nicotine or tobacco, such as cigarettes, e-cigarettes, and chewing tobacco. If you need help quitting, ask your health care provider.  Avoid caffeine, alcohol, and certain over-the-counter cold medicines. These may make you feel worse. Ask your pharmacist which medicines to avoid. General instructions  Take over-the-counter and prescription medicines only as told by your health care provider.  Keep all follow-up visits as told by your health care provider. This is important. Where to find support You can get help and support from these sources:  Self-help groups.  Online and [employer].  A trusted spiritual leader.  Couples counseling.  Family education classes.  Family therapy. Where to find more information You may find that joining a support group helps you deal with your anxiety. The following sources can help you locate counselors or support groups near you:  Mental Health America: www.mentalhealthamerica.net  Anxiety and Depression Association of [place] (ADAA): ProgramCam.de  [employer] Mental Illness (  NAMI): www.nami.org Contact a health care provider if you:  Have a hard time staying focused or finishing daily tasks.  Spend many hours a day feeling worried about everyday life.  Become exhausted by worry.  Start to have headaches, feel tense, or have nausea.  Urinate more than normal.  Have diarrhea. Get help right away if you have:  A racing heart and shortness of breath.  Thoughts of hurting yourself or others. If you ever feel like you may hurt yourself or others, or have thoughts about taking your own life, get help right away. You can go to your nearest emergency department or call:  Your local  emergency services (911 in the [place]  A suicide crisis helpline, such as the National Suicide Prevention Lifeline at 641-178-4620. This is open 24 hours a day. Summary  Taking steps to learn and use tension reduction techniques can help calm you and help prevent triggering an anxiety reaction.  When used together, medicines, psychotherapy, and tension reduction techniques may be the most effective treatment.  Family, friends, and partners can play a big part in helping you recover from an anxiety disorder. This information is not intended to replace advice given to you by your health care provider. Make sure you discuss any questions you have with your health care provider. Document Revised: 05/14/2019 Document Reviewed: 05/14/2019 Elsevier Patient Education  2020 [employer].    How to Take Your Blood Pressure You can take your blood pressure at home with a machine. You may need to check your blood pressure at home:  To check if you have high blood pressure (hypertension).  To check your blood pressure over time.  To make sure your blood pressure medicine is working. Supplies needed: You will need a blood pressure machine, or monitor. You can buy one at a drugstore or online. When choosing one:  Choose one with an arm cuff.  Choose one that wraps around your upper arm. Only one finger should fit between your arm and the cuff.  Do not choose one that measures your blood pressure from your wrist or finger. Your doctor can suggest a monitor. How to prepare Avoid these things for 30 minutes before checking your blood pressure:  Drinking caffeine.  Drinking alcohol.  Eating.  Smoking.  Exercising. Five minutes before checking your blood pressure:  Pee.  Sit in a dining chair. Avoid sitting in a soft couch or armchair.  Be quiet. Do not talk. How to take your blood pressure Follow the instructions that came with your machine. If you have a digital blood pressure  monitor, these may be the instructions: 1. Sit up straight. 2. Place your feet on the floor. Do not cross your ankles or legs. 3. Rest your left arm at the level of your heart. You may rest it on a table, desk, or chair. 4. Pull up your shirt sleeve. 5. Wrap the blood pressure cuff around the upper part of your left arm. The cuff should be 1 inch (2.5 cm) above your elbow. It is best to wrap the cuff around bare skin. 6. Fit the cuff snugly around your arm. You should be able to place only one finger between the cuff and your arm. 7. Put the cord inside the groove of your elbow. 8. Press the power button. 9. Sit quietly while the cuff fills with air and loses air. 10. Write down the numbers on the screen. 11. Wait 2-3 minutes and then repeat steps 1-10. What do the  numbers mean? Two numbers make up your blood pressure. The first number is called systolic pressure. The second is called diastolic pressure. An example of a blood pressure reading is "120 over 80" (or 120/80). If you are an adult and do not have a medical condition, use this guide to find out if your blood pressure is normal: Normal  First number: below 120.  Second number: below 80. Elevated  First number: 120-129.  Second number: below 80. Hypertension stage 1  First number: 130-139.  Second number: 80-89. Hypertension stage 2  First number: 140 or above.  Second number: 53 or above. Your blood pressure is above normal even if only the top or bottom number is above normal. Follow these instructions at home:  Check your blood pressure as often as your doctor tells you to.  Take your monitor to your next doctor's appointment. Your doctor will: ? Make sure you are using it correctly. ? Make sure it is working right.  Make sure you understand what your blood pressure numbers should be.  Tell your doctor if your medicines are causing side effects. Contact a doctor if:  Your blood pressure keeps being  high. Get help right away if:  Your first blood pressure number is higher than 180.  Your second blood pressure number is higher than 120. This information is not intended to replace advice given to you by your health care provider. Make sure you discuss any questions you have with your health care provider. Document Revised: 11/24/2017 Document Reviewed: 05/20/2016 Elsevier Patient Education  [place].   Preventing Hypertension Hypertension, commonly called high blood pressure, is when the force of blood pumping through the arteries is too strong. Arteries are blood vessels that carry blood from the heart throughout the body. Over time, hypertension can damage the arteries and decrease blood flow to important parts of the body, including the brain, heart, and kidneys. Often, hypertension does not cause symptoms until blood pressure is very high. For this reason, it is important to have your blood pressure checked on a regular basis. Hypertension can often be prevented with diet and lifestyle changes. If you already have hypertension, you can control it with diet and lifestyle changes, as well as medicine. What nutrition changes can be made? Maintain a healthy diet. This includes:  Eating less salt (sodium). Ask your health care provider how much sodium is safe for you to have. The general recommendation is to consume less than 1 tsp (2,300 mg) of sodium a day. ? Do not add salt to your food. ? Choose low-sodium options when grocery shopping and eating out.  Limiting fats in your diet. You can do this by eating low-fat or fat-free dairy products and by eating less red meat.  Eating more fruits, vegetables, and whole grains. Make a goal to eat: ? 1-2 cups of fresh fruits and vegetables each day. ? 3-4 servings of whole grains each day.  Avoiding foods and beverages that have added sugars.  Eating fish that contain healthy fats (omega-3 fatty acids), such as mackerel or  salmon. If you need help putting together a healthy eating plan, try the DASH diet. This diet is high in fruits, vegetables, and whole grains. It is low in sodium, red meat, and added sugars. DASH stands for Dietary Approaches to Stop Hypertension. What lifestyle changes can be made?   Lose weight if you are overweight. Losing just 3?5% of your body weight can help prevent or control hypertension. ? For  example, if your present weight is 200 lb (91 kg), a loss of 3-5% of your weight means losing 6-10 lb (2.7-4.5 kg). ? Ask your health care provider to help you with a diet and exercise plan to safely lose weight.  Get enough exercise. Do at least 150 minutes of moderate-intensity exercise each week. ? You could do this in short exercise sessions several times a day, or you could do longer exercise sessions a few times a week. For example, you could take a brisk 10-minute walk or bike ride, 3 times a day, for 5 days a week.  Find ways to reduce stress, such as exercising, meditating, listening to music, or taking a yoga class. If you need help reducing stress, ask your health care provider.  Do not smoke. This includes e-cigarettes. Chemicals in tobacco and nicotine products raise your blood pressure each time you smoke. If you need help quitting, ask your health care provider.  Avoid alcohol. If you drink alcohol, limit alcohol intake to no more than 1 drink a day for nonpregnant women and 2 drinks a day for men. One drink equals 12 oz of beer, 5 oz of wine, or 1 oz of hard liquor. Why are these changes important? Diet and lifestyle changes can help you prevent hypertension, and they may make you feel better overall and improve your quality of life. If you have hypertension, making these changes will help you control it and help prevent major complications, such as:  Hardening and narrowing of arteries that supply blood to: ? Your heart. This can cause a heart attack. ? Your brain. This can  cause a stroke. ? Your kidneys. This can cause kidney failure.  Stress on your heart muscle, which can cause heart failure. What can I do to lower my risk?  Work with your health care provider to make a hypertension prevention plan that works for you. Follow your plan and keep all follow-up visits as told by your health care provider.  Learn how to check your blood pressure at home. Make sure that you know your personal target blood pressure, as told by your health care provider. How is this treated? In addition to diet and lifestyle changes, your health care provider may recommend medicines to help lower your blood pressure. You may need to try a few different medicines to find what works best for you. You also may need to take more than one medicine. Take over-the-counter and prescription medicines only as told by your health care provider. Where to find support Your health care provider can help you prevent hypertension and help you keep your blood pressure at a healthy level. Your local hospital or your community may also provide support services and prevention programs. The American Heart Association offers an online support network at: https://www.lee.net/ Where to find more information Learn more about hypertension from:  National Heart, Lung, and Blood Institute: https://www.peterson.org/  Centers for Disease Control and Prevention: AboutHD.co.nz  American Academy of Family Physicians: http://familydoctor.org/familydoctor/en/diseases-conditions/high-blood-pressure.printerview.all.html Learn more about the DASH diet from:  National Heart, Lung, and Blood Institute: WedMap.it Contact a health care provider if:  You think you are having a reaction to medicines you have taken.  You have recurrent headaches or feel dizzy.  You have swelling in your ankles.  You have trouble  with your vision. Summary  Hypertension often does not cause any symptoms until blood pressure is very high. It is important to get your blood pressure checked regularly.  Diet and lifestyle changes  are the most important steps in preventing hypertension.  By keeping your blood pressure in a healthy range, you can prevent complications like heart attack, heart failure, stroke, and kidney failure.  Work with your health care provider to make a hypertension prevention plan that works for you. This information is not intended to replace advice given to you by your health care provider. Make sure you discuss any questions you have with your health care provider. Document Revised: 04/05/2019 Document Reviewed: 08/22/2016 Elsevier Patient Education  2020 [employer].

## 2020-04-15 NOTE — Progress Notes (Signed)
Name: Rhonda Davies   MRN: [id number]    DOB: Jun 30, 1991   Date:04/15/2020       Progress Note  Subjective:    I connected with  Rhonda Davies  on 04/15/20 at 11:00 AM EDT by a video enabled telemedicine application and verified that I am speaking with the correct person using two identifiers.  I discussed the limitations of evaluation and management by telemedicine and the availability of in person appointments. The patient expressed understanding and agreed to proceed. Staff also discussed with the patient that there may be a patient responsible charge related to this service. Patient Location: home  Provider Location: [facility] Additional Individuals present: none  Chief Complaint  Patient presents with  . Follow-up  . Depression    Rhonda Davies is a 29 y.o. female, presents for virtual visit for routine follow up on the conditions listed above.  Pt here for routine f/up virtually  She has hx of anxiety and depression,  Restarted lexapro 10 mg   Depression screen [facility] 2/9 04/15/2020 12/09/2019 11/06/2019  Decreased Interest 0 1 1  Down, Depressed, Hopeless 1 0 1  PHQ - 2 Score 1 1 2   Altered sleeping 3 2 1   Tired, decreased energy 3 1 1   Change in appetite 0 0 0  Feeling bad or failure about yourself  0 0 0  Trouble concentrating 1 1 0  Moving slowly or fidgety/restless 0 0 0  Suicidal thoughts 0 0 0  PHQ-9 Score 8 5 4   Difficult doing work/chores Somewhat difficult Somewhat difficult Not difficult at all  Some recent data might be hidden    Dad has declining health - she helps take care of her dad with her mom.  Dad recently had his 3 back surgery after an injury on the job.  is also helping care for dad.   She is back in school full time and has 2 kids 1 y/o and 3 y/o   BP high today - she has started a log of her BP readings 4/18 163/85 4/19 133/65 4/20 165/85 4/21 166/85 Had PIH with last pregnancy and first pregnancy she did have mild  hypertension Was on labetalol with last pregnancy  She did recently have her well woman with her OB/GYN Dr. 5/18,  her blood pressure at that visit was normal and excellent.  She does have increased stress in her life.  She mostly has been doing readings in the evenings.  She does not smoke or drink caffeine she rarely drinks alcohol.  She denies any associated symptoms with the elevated blood pressure.  She has been checking once a day for only the past few days. BP Readings from Last 3 Encounters:  04/15/20 (!) 166/85  02/25/20 112/71  10/22/19 114/78    Patient Active Problem List   Diagnosis Date Noted  . Post-dates pregnancy 02/18/2019  . Current mild episode of major depressive disorder without prior episode (HCC) 06/12/2018  . Anxiety 04/03/2018  . Stress headaches 04/03/2018  . Family history of diabetes mellitus 03/20/2018  . H/O trichomoniasis 12/14/2016  . ASCUS with positive high risk HPV 05/29/2016  . Morbid obesity with BMI of 40.0-44.9, adult (HCC) 04/26/2016  . H/O chlamydia infection 01/27/2016    Current Outpatient Medications:  .  levonorgestrel (MIRENA) 20 MCG/24HR IUD, 1 each by Intrauterine route once., Disp: , Rfl:  .  busPIRone (BUSPAR) 5 MG tablet, Take 1-2 tablets (5-10 mg total) by mouth 2 (two) times daily  as needed (for anxiety/stress/agitation sx)., Disp: 60 tablet, Rfl: 2 .  escitalopram (LEXAPRO) 20 MG tablet, Take 1 tablet (20 mg total) by mouth daily., Disp: 90 tablet, Rfl: 3 Allergies  Allergen Reactions  . Benzonatate Swelling    Past Surgical History:  Procedure Laterality Date  . CYST REMOVED  2013   IN NECK  . TONSILLECTOMY AND ADENOIDECTOMY  2007   Family History  Problem Relation Age of Onset  . Heart disease Father   . Anxiety disorder Father   . Heart disease Maternal Grandfather   . Diabetes Paternal Grandmother   . Heart disease Paternal Grandfather   . Anxiety disorder Paternal Aunt   . Bipolar disorder Paternal Aunt   .  Diabetes Paternal Aunt   . Anxiety disorder Brother   . Aneurysm Paternal Uncle   . Breast cancer Neg Hx   . Colon cancer Neg Hx   . Ovarian cancer Neg Hx    Social History   Socioeconomic History  . Marital status: Significant Other    Spouse name: Not on file  . Number of children: 1  . Years of education: Not on file  . Highest education level: 12th grade  Occupational History  . Not on file  Tobacco Use  . Smoking status: Never Smoker  . Smokeless tobacco: Never Used  Substance and Sexual Activity  . Alcohol use: No    Alcohol/week: 1.0 standard drinks    Types: 1 Standard drinks or equivalent per week  . Drug use: No  . Sexual activity: Yes    Partners: Male    Birth control/protection: I.U.D.  Other Topics Concern  . Not on file  Social History Narrative   Single.    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    Social Determinants of Health   Financial Resource Strain:   . Difficulty of Paying Living Expenses:   Food Insecurity:   . Worried About Programme researcher, broadcasting/film/video in the Last Year:   . Barista in the Last Year:   Transportation Needs:   . Freight forwarder (Medical):   Marland Kitchen Lack of Transportation (Non-Medical):   Physical Activity:   . Days of Exercise per Week:   . Minutes of Exercise per Session:   Stress:   . Feeling of Stress :   Social Connections:   . Frequency of Communication with Friends and Family:   . Frequency of Social Gatherings with Friends and Family:   . Attends Religious Services:   . Active Member of Clubs or Organizations:   . Attends Banker Meetings:   Marland Kitchen Marital Status:   Intimate Partner Violence:   . Fear of Current or Ex-Partner:   . Emotionally Abused:   Marland Kitchen Physically Abused:   . Sexually Abused:     Chart Review Today: I personally reviewed active problem list, medication list, allergies, family history, social history, health  maintenance, notes from last encounter, lab results, imaging with the patient/caregiver today.   Review of Systems  10 Systems reviewed and are negative for acute change except as noted in the HPI.   Objective:    Virtual encounter, vitals limited, only able to obtain the following Today's Vitals   04/15/20 1105  BP: (!) 166/85  Pulse: 97  Weight: 241 lb (109.3 kg)  Height: 5\' 4"  (1.626 m)   Body mass index is 41.37 kg/m.  Nursing Note and Vital Signs reviewed.  Physical Exam Nursing note reviewed.  Constitutional:      General: She is not in acute distress.    Appearance: Normal appearance. She is not ill-appearing, toxic-appearing or diaphoretic.  HENT:     Head: Normocephalic and atraumatic.  Pulmonary:     Breath sounds: Normal breath sounds.  Neurological:     Mental Status: She is alert.  Psychiatric:        Attention and Perception: Attention normal.        Mood and Affect: Mood and affect normal. Mood is not anxious or depressed.        Speech: Speech normal.        Behavior: Behavior is cooperative.        Thought Content: Thought content normal. Thought content does not include suicidal ideation. Thought content does not include suicidal plan.        Cognition and Memory: Cognition normal.     PE limited by telephone encounter  No results found for this or any previous visit (from the past 72 hour(s)).  PHQ2/9: Depression screen [facility] 2/9 04/15/2020 12/09/2019 11/06/2019 04/01/2019 06/12/2018  Decreased Interest 0 1 1 0 1  Down, Depressed, Hopeless 1 0 1 1 2   PHQ - 2 Score 1 1 2 1 3   Altered sleeping 3 2 1 1 3   Tired, decreased energy 3 1 1  0 2  Change in appetite 0 0 0 0 2  Feeling bad or failure about yourself  0 0 0 0 2  Trouble concentrating 1 1 0 0 1  Moving slowly or fidgety/restless 0 0 0 0 0  Suicidal thoughts 0 0 0 0 0  PHQ-9 Score 8 5 4 2 13   Difficult doing work/chores Somewhat difficult Somewhat difficult Not difficult at all Not difficult at  all Somewhat difficult  Some recent data might be hidden   PHQ-2/9 Result is positive  Fall Risk: Fall Risk  04/15/2020 12/09/2019 11/06/2019 05/07/2018 03/20/2018  Falls in the past year? 0 0 0 No No  Number falls in past yr: 0 0 0 - -  Injury with Fall? 0 0 0 - -  Follow up - Falls evaluation completed Falls evaluation completed - -     Assessment and Plan:     ICD-10-CM   1. Current mild episode of major depressive disorder without prior episode (HCC)  F32.0    worsening depressive sx, phq score increased 5 to 8, mild depressive sx, worsening fatigue and altered sleep, increase lexapro dose to 20 mg  2. Anxiety disorder, unspecified type  F41.9 busPIRone (BUSPAR) 5 MG tablet   anxiety sx worsening, more stressed, anxious, edgy and "short" she has been told.  increase lexapro to 20 mg add buspar 5-10 TID prn  3. Situational stress  F43.9 busPIRone (BUSPAR) 5 MG tablet   two young children, in school full time, helping care for her father with health issues, very busy, stressed and anxious  4. Blood pressure elevated without history of HTN  R03.0    elevated home BP readings with increased anxiety, no cardiac sx, recent prior BP readings in office have been normal, monitor and BP check here if still high  5. History of pregnancy induced hypertension  Z87.59    PIH that resolved after delivery with her 2 pregnancies her readings over the past year when in person in office have been normal  6. Family history of hypertension  Z82.49    Patient is  concerned with a family history of hypertension   For anxiety depression and situational stress did discuss with the patient changing medications, following up in the next 6 weeks for recheck.  Encouraged her to find healthy coping skills for her stress and anxiety including exercise, journaling, encouraged her to talk to a therapist.  It is understandable with 2 toddlers, going to school full-time and helping with her father's health care that she  would have increased stress and anxiety.  Encouraged her to reach out sooner if symptoms are worsening or if medications are not effective or she has concerning side effects.  Her blood pressure was elevated today with home blood pressure readings, we discussed this at length she does not have any current symptoms and she is suspicious that they are elevated because of her anxiety and stress.  We discussed at length techniques for home blood pressure monitoring and a handout will be sent to her through her after visit summary through [employer].  She was encouraged to continue to monitor at home several times a day when very relaxed following the handout.  She will send [place] readings in 1 week.  If they are consistently elevated before initiating blood pressure medications I would want her to come in office to compare her home blood pressure cuff to a manual blood pressure done by her nursing staff since she recently had visits with completely wonderful and normal blood pressure.     I discussed the assessment and treatment plan with the patient. The patient was provided an opportunity to ask questions and all were answered. The patient agreed with the plan and demonstrated an understanding of the instructions.  The patient was advised to call back or seek an in-person evaluation if the symptoms worsen or if the condition fails to improve as anticipated.  I provided 30+ minutes of non-face-to-face time during this encounter.  Delsa Grana, PA-C 04/15/20 12:52 PM

## 2020-05-27 ENCOUNTER — Encounter: Payer: Self-pay | Admitting: Family Medicine

## 2020-05-27 ENCOUNTER — Telehealth (INDEPENDENT_AMBULATORY_CARE_PROVIDER_SITE_OTHER): Payer: BC Managed Care – PPO | Admitting: Family Medicine

## 2020-05-27 ENCOUNTER — Other Ambulatory Visit: Payer: Self-pay

## 2020-05-27 VITALS — Ht 64.0 in | Wt 242.0 lb

## 2020-05-27 DIAGNOSIS — R030 Elevated blood-pressure reading, without diagnosis of hypertension: Secondary | ICD-10-CM

## 2020-05-27 DIAGNOSIS — F320 Major depressive disorder, single episode, mild: Secondary | ICD-10-CM

## 2020-05-27 DIAGNOSIS — F439 Reaction to severe stress, unspecified: Secondary | ICD-10-CM | POA: Insufficient documentation

## 2020-05-27 DIAGNOSIS — F419 Anxiety disorder, unspecified: Secondary | ICD-10-CM | POA: Diagnosis not present

## 2020-05-27 NOTE — Telephone Encounter (Signed)
Please have her schedule an in person OV to address this

## 2020-05-27 NOTE — Progress Notes (Signed)
Name: Rhonda Davies   MRN: [id number]    DOB: 1991/10/31   Date:05/27/2020       Progress Note  Subjective:    Chief Complaint  Chief Complaint  Patient presents with  . Follow-up  . Depression    I connected with  [name]  on 05/27/20 at  9:20 AM EDT by a video enabled telemedicine application and verified that I am speaking with the correct person using two identifiers.  I discussed the limitations of evaluation and management by telemedicine and the availability of in person appointments. The patient expressed understanding and agreed to proceed. Staff also discussed with the patient that there may be a patient responsible charge related to this service. Patient Location: home Provider Location: [facility] Additional Individuals present: none  HPI  F/up on depression and anxiety about 6 weeks ago lexapro 10 mg increased to 20 mg daily and taking buspar prn, she feels like things are much better.  She is still helping with her dads care, taking care of kids and in school w/o breaks.  Feels like she is less anxious and less depressed.  Less anxiety attacks.  Sleep and energy are better Depression screen [facility] 2/9 05/27/2020 04/15/2020 12/09/2019  Decreased Interest 1 0 1  Down, Depressed, Hopeless 0 1 0  PHQ - 2 Score 1 1 1   Altered sleeping 1 3 2   Tired, decreased energy 1 3 1   Change in appetite 0 0 0  Feeling bad or failure about yourself  0 0 0  Trouble concentrating 0 1 1  Moving slowly or fidgety/restless 0 0 0  Suicidal thoughts 0 0 0  PHQ-9 Score 3 8 5   Difficult doing work/chores Not difficult at all Somewhat difficult Somewhat difficult  Some recent data might be hidden   BP was elevated last time after high BP at OBGYN office, she started monitoring at home prior to last OV BP was elevated at home similar to 4/21 OV Plan was to monitor, address stress/anxiety/depression and f/up BP Readings from Last 10 Encounters:  04/15/20 (!) 166/85  02/25/20  112/71  10/22/19 114/78  09/24/19 126/79  04/09/19 108/74  04/01/19 128/76  02/20/19 119/70  02/13/19 121/75  02/06/19 126/75  01/30/19 108/71  Pt denies CP, SOB, exertional sx, LE edema, palpitation, Ha's, visual disturbances,  lightheadedness, hypotension, syncope. She feels better than at last visit, she has decreased stress, she notes "no headache" though she did not previously report HA sx with elevated BP at last visit. Monitoring at home SBP decreased to 130's, she did not do a reading today.      Patient Active Problem List   Diagnosis Date Noted  . Post-dates pregnancy 02/18/2019  . Current mild episode of major depressive disorder without prior episode (HCC) 06/12/2018  . Anxiety 04/03/2018  . Stress headaches 04/03/2018  . Family history of diabetes mellitus 03/20/2018  . H/O trichomoniasis 12/14/2016  . ASCUS with positive high risk HPV 05/29/2016  . Morbid obesity with BMI of 40.0-44.9, adult (HCC) 04/26/2016  . H/O chlamydia infection 01/27/2016    Social History   Tobacco Use  . Smoking status: Never Smoker  . Smokeless tobacco: Never Used  Substance Use Topics  . Alcohol use: No    Alcohol/week: 1.0 standard drinks    Types: 1 Standard drinks or equivalent per week     Current Outpatient Medications:  .  busPIRone (BUSPAR) 5 MG tablet, Take 1-2 tablets (5-10 mg total) by mouth 2 (  two) times daily as needed (for anxiety/stress/agitation sx)., Disp: 60 tablet, Rfl: 2 .  escitalopram (LEXAPRO) 20 MG tablet, Take 1 tablet (20 mg total) by mouth daily., Disp: 90 tablet, Rfl: 3 .  levonorgestrel (MIRENA) 20 MCG/24HR IUD, 1 each by Intrauterine route once., Disp: , Rfl:   Allergies  Allergen Reactions  . Benzonatate Swelling    I personally reviewed active problem list, medication list, allergies, family history, social history, health maintenance, notes from last encounter, lab results, imaging with the patient/caregiver today.   Review of Systems  10  Systems reviewed and are negative for acute change except as noted in the HPI.   Objective:   Virtual encounter, vitals limited, only able to obtain the following Today's Vitals   05/27/20 0918  Weight: 242 lb (109.8 kg)  Height: 5\' 4"  (1.626 m)   Body mass index is 41.54 kg/m. Nursing Note and Vital Signs reviewed.  Physical Exam Vitals and nursing note reviewed.  Constitutional:      General: She is not in acute distress.    Appearance: Normal appearance. She is obese. She is not ill-appearing, toxic-appearing or diaphoretic.  Pulmonary:     Effort: No respiratory distress.  Neurological:     Mental Status: She is alert.  Psychiatric:        Mood and Affect: Mood normal.        Behavior: Behavior normal.     PE limited by telephone encounter  No results found for this or any previous visit (from the past 72 hour(s)).  Assessment and Plan:   1. Current mild episode of major depressive disorder without prior episode ([place]) Improving depressive sx with increased lexapro dose - continue 20 mg lexapro, continue to work on coping mechanisms, self care, f/up if any worsening depressive sx or any med SE or concerns - she has year supply of lexapro 20 mg  2. Anxiety disorder, unspecified type Improving anxiety sx with PRN use of buspar 5-10 mg.  Still has situational stress with school, kids and caring for father, but has been able to manage and deal with everything better.  Lexapro helping and on days where she feels like she may have a "panic attack" or "anxiety attack" she will take buspar and that seems to help calm her down and prevent it  3. Situational stress Per above - pt encouraged to reach out if she needs additional help - ie therapy/care give support etc.  4. Blood pressure elevated without history of HTN Home monitoring shows some improvement in BP - SBP 130's Encouraged her to continue to monitor and send in some BP readings - if still >130/80 would treat her or  want her to come into office to address and make a plan - goal for age would be <120/70    -Red flags and when to present for emergency care or RTC including fever >101.70F, chest pain, shortness of breath, new/worsening/un-resolving symptoms, reviewed with patient at time of visit. Follow up and care instructions discussed and provided in AVS. - I discussed the assessment and treatment plan with the patient. The patient was provided an opportunity to ask questions and all were answered. The patient agreed with the plan and demonstrated an understanding of the instructions.  I provided 30 minutes of non-face-to-face time during this encounter.  Delsa Grana, PA-C 05/27/20 9:57 AM

## 2020-07-03 ENCOUNTER — Encounter: Payer: Self-pay | Admitting: Family Medicine

## 2020-07-03 ENCOUNTER — Other Ambulatory Visit: Payer: Self-pay

## 2020-07-03 ENCOUNTER — Ambulatory Visit: Payer: BC Managed Care – PPO | Admitting: Family Medicine

## 2020-07-03 VITALS — BP 126/82 | HR 100 | Temp 97.1°F | Resp 18 | Ht 64.0 in | Wt 259.4 lb

## 2020-07-03 DIAGNOSIS — F320 Major depressive disorder, single episode, mild: Secondary | ICD-10-CM | POA: Diagnosis not present

## 2020-07-03 DIAGNOSIS — R030 Elevated blood-pressure reading, without diagnosis of hypertension: Secondary | ICD-10-CM

## 2020-07-03 DIAGNOSIS — F419 Anxiety disorder, unspecified: Secondary | ICD-10-CM | POA: Diagnosis not present

## 2020-07-03 DIAGNOSIS — Z7689 Persons encountering health services in other specified circumstances: Secondary | ICD-10-CM

## 2020-07-03 DIAGNOSIS — R635 Abnormal weight gain: Secondary | ICD-10-CM

## 2020-07-03 DIAGNOSIS — R600 Localized edema: Secondary | ICD-10-CM

## 2020-07-03 DIAGNOSIS — E8881 Metabolic syndrome: Secondary | ICD-10-CM

## 2020-07-03 DIAGNOSIS — Z6841 Body Mass Index (BMI) 40.0 and over, adult: Secondary | ICD-10-CM

## 2020-07-03 MED ORDER — PHENTERMINE HCL 37.5 MG PO TABS: 37.5000 mg | ORAL_TABLET | Freq: Every day | ORAL | 0 refills | Status: DC

## 2020-07-03 NOTE — Patient Instructions (Addendum)
Call your insurance and see if they cover saxenda for medical weight management and obesity  Follow up with OBGYN to see if your birth control could be contributing to weight gain.   Calorie Counting for Weight Loss Calories are units of energy. Your body needs a certain amount of calories from food to keep you going throughout the day. When you eat more calories than your body needs, your body stores the extra calories as fat. When you eat fewer calories than your body needs, your body burns fat to get the energy it needs. Calorie counting means keeping track of how many calories you eat and drink each day. Calorie counting can be helpful if you need to lose weight. If you make sure to eat fewer calories than your body needs, you should lose weight. Ask your health care provider what a healthy weight is for you. For calorie counting to work, you will need to eat the right number of calories in a day in order to lose a healthy amount of weight per week. A dietitian can help you determine how many calories you need in a day and will give you suggestions on how to reach your calorie goal.  A healthy amount of weight to lose per week is usually 1-2 lb (0.5-0.9 kg). This usually means that your daily calorie intake should be reduced by 500-750 calories.    What is my plan?  May need to start with trying to be at 2000 cal per day diet and take it from there.   My goal is to have __________ calories per day. If I have this many calories per day, I should lose around __________ pounds per week. What do I need to know about calorie counting? In order to meet your daily calorie goal, you will need to:  Find out how many calories are in each food you would like to eat. Try to do this before you eat.  Decide how much of the food you plan to eat.  Write down what you ate and how many calories it had. Doing this is called keeping a food log. To successfully lose weight, it is important to balance  calorie counting with a healthy lifestyle that includes regular activity. Aim for 150 minutes of moderate exercise (such as walking) or 75 minutes of vigorous exercise (such as running) each week. Where do I find calorie information?  The number of calories in a food can be found on a Nutrition Facts label. If a food does not have a Nutrition Facts label, try to look up the calories online or ask your dietitian for help. Remember that calories are listed per serving. If you choose to have more than one serving of a food, you will have to multiply the calories per serving by the amount of servings you plan to eat. For example, the label on a package of bread might say that a serving size is 1 slice and that there are 90 calories in a serving. If you eat 1 slice, you will have eaten 90 calories. If you eat 2 slices, you will have eaten 180 calories. How do I keep a food log? Immediately after each meal, record the following information in your food log:  What you ate. Don't forget to include toppings, sauces, and other extras on the food.  How much you ate. This can be measured in cups, ounces, or number of items.  How many calories each food and drink had.  The total number  of calories in the meal. Keep your food log near you, such as in a small notebook in your pocket, or use a mobile app or website. Some programs will calculate calories for you and show you how many calories you have left for the day to meet your goal. What are some calorie counting tips?   Use your calories on foods and drinks that will fill you up and not leave you hungry: ? Some examples of foods that fill you up are nuts and nut butters, vegetables, lean proteins, and high-fiber foods like whole grains. High-fiber foods are foods with more than 5 g fiber per serving. ? Drinks such as sodas, specialty coffee drinks, alcohol, and juices have a lot of calories, yet do not fill you up.  Eat nutritious foods and avoid empty  calories. Empty calories are calories you get from foods or beverages that do not have many vitamins or protein, such as candy, sweets, and soda. It is better to have a nutritious high-calorie food (such as an avocado) than a food with few nutrients (such as a bag of chips).  Know how many calories are in the foods you eat most often. This will help you calculate calorie counts faster.  Pay attention to calories in drinks. Low-calorie drinks include water and unsweetened drinks.  Pay attention to nutrition labels for "low fat" or "fat free" foods. These foods sometimes have the same amount of calories or more calories than the full fat versions. They also often have added sugar, starch, or salt, to make up for flavor that was removed with the fat.  Find a way of tracking calories that works for you. Get creative. Try different apps or programs if writing down calories does not work for you. What are some portion control tips?  Know how many calories are in a serving. This will help you know how many servings of a certain food you can have.  Use a measuring cup to measure serving sizes. You could also try weighing out portions on a kitchen scale. With time, you will be able to estimate serving sizes for some foods.  Take some time to put servings of different foods on your favorite plates, bowls, and cups so you know what a serving looks like.  Try not to eat straight from a bag or box. Doing this can lead to overeating. Put the amount you would like to eat in a cup or on a plate to make sure you are eating the right portion.  Use smaller plates, glasses, and bowls to prevent overeating.  Try not to multitask (for example, watch TV or use your computer) while eating. If it is time to eat, sit down at a table and enjoy your food. This will help you to know when you are full. It will also help you to be aware of what you are eating and how much you are eating. What are tips for following this  plan? Reading food labels  Check the calorie count compared to the serving size. The serving size may be smaller than what you are used to eating.  Check the source of the calories. Make sure the food you are eating is high in vitamins and protein and low in saturated and trans fats. Shopping  Read nutrition labels while you shop. This will help you make healthy decisions before you decide to purchase your food.  Make a grocery list and stick to it. Cooking  Try to [employer] your favorite foods  in a healthier way. For example, try baking instead of frying.  Use low-fat dairy products. Meal planning  Use more fruits and vegetables. Half of your plate should be fruits and vegetables.  Include lean proteins like poultry and fish. How do I count calories when eating out?  Ask for smaller portion sizes.  Consider sharing an entree and sides instead of getting your own entree.  If you get your own entree, eat only half. Ask for a box at the beginning of your meal and put the rest of your entree in it so you are not tempted to eat it.  If calories are listed on the menu, choose the lower calorie options.  Choose dishes that include vegetables, fruits, whole grains, low-fat dairy products, and lean protein.  Choose items that are boiled, broiled, grilled, or steamed. Stay away from items that are buttered, battered, fried, or served with cream sauce. Items labeled "crispy" are usually fried, unless stated otherwise.  Choose water, low-fat milk, unsweetened iced tea, or other drinks without added sugar. If you want an alcoholic beverage, choose a lower calorie option such as a glass of wine or light beer.  Ask for dressings, sauces, and syrups on the side. These are usually high in calories, so you should limit the amount you eat.  If you want a salad, choose a garden salad and ask for grilled meats. Avoid extra toppings like bacon, cheese, or fried items. Ask for the dressing on the side,  or ask for olive oil and vinegar or lemon to use as dressing.  Estimate how many servings of a food you are given. For example, a serving of cooked rice is  cup or about the size of half a baseball. Knowing serving sizes will help you be aware of how much food you are eating at restaurants. The list below tells you how big or small some common portion sizes are based on everyday objects: ? 1 oz--4 stacked dice. ? 3 oz--1 deck of cards. ? 1 tsp--1 die. ? 1 Tbsp-- a ping-pong ball. ? 2 Tbsp--1 ping-pong ball. ?  cup-- baseball. ? 1 cup--1 baseball. Summary  Calorie counting means keeping track of how many calories you eat and drink each day. If you eat fewer calories than your body needs, you should lose weight.  A healthy amount of weight to lose per week is usually 1-2 lb (0.5-0.9 kg). This usually means reducing your daily calorie intake by 500-750 calories.  The number of calories in a food can be found on a Nutrition Facts label. If a food does not have a Nutrition Facts label, try to look up the calories online or ask your dietitian for help.  Use your calories on foods and drinks that will fill you up, and not on foods and drinks that will leave you hungry.  Use smaller plates, glasses, and bowls to prevent overeating. This information is not intended to replace advice given to you by your health care provider. Make sure you discuss any questions you have with your health care provider. Document Revised: 08/31/2018 Document Reviewed: 11/11/2016 Elsevier Patient Education  2020 Else[employer] Inc.  Liraglutide injection (Weight Management) What is this medicine? LIRAGLUTIDE (LIR a GLOO tide) is used to help people lose weight and maintain weight loss. It is used with a reduced-calorie diet and exercise. This medicine may be used for other purposes; ask your health care provider or pharmacist if you have questions. COMMON BRAND NAME(S): Saxenda What should I tell  my health care  provider before I take this medicine? They need to know if you have any of these conditions:  endocrine tumors (MEN 2) or if someone in your family had these tumors  gallbladder disease  high cholesterol  history of alcohol abuse problem  history of pancreatitis  kidney disease or if you are on dialysis  liver disease  previous swelling of the tongue, face, or lips with difficulty breathing, difficulty swallowing, hoarseness, or tightening of the throat  stomach problems  suicidal thoughts, plans, or attempt; a previous suicide attempt by you or a family member  thyroid cancer or if someone in your family had thyroid cancer  an unusual or allergic reaction to liraglutide, other medicines, foods, dyes, or preservatives  pregnant or trying to get pregnant  breast-feeding How should I use this medicine? This medicine is for injection under the skin of your upper leg, stomach area, or upper arm. You will be taught how to prepare and give this medicine. Use exactly as directed. Take your medicine at regular intervals. Do not take it more often than directed. This drug comes with INSTRUCTIONS FOR USE. Ask your pharmacist for directions on how to use this drug. Read the information carefully. Talk to your pharmacist or health care provider if you have questions. It is important that you put your used needles and syringes in a special sharps container. Do not put them in a trash can. If you do not have a sharps container, call your pharmacist or healthcare provider to get one. A special MedGuide will be given to you by the pharmacist with each prescription and refill. Be sure to read this information carefully each time. Talk to your pediatrician regarding the use of this medicine in children. Special care may be needed. Overdosage: If you think you have taken too much of this medicine contact a poison control center or emergency room at once. NOTE: This medicine is only for you. Do not  share this medicine with others. What if I miss a dose? If you miss a dose, take it as soon as you can. If it is almost time for your next dose, take only that dose. Do not take double or extra doses. If you miss your dose for 3 days or more, call your doctor or health care professional to talk about how to restart this medicine. What may interact with this medicine?  insulin and other medicines for diabetes This list may not describe all possible interactions. Give your health care provider a list of all the medicines, herbs, non-prescription drugs, or dietary supplements you use. Also tell them if you smoke, drink alcohol, or use illegal drugs. Some items may interact with your medicine. What should I watch for while using this medicine? Visit your doctor or health care professional for regular checks on your progress. Drink plenty of fluids while taking this medicine. Check with your doctor or health care professional if you get an attack of severe diarrhea, nausea, and vomiting. The loss of too much body fluid can make it dangerous for you to take this medicine. This medicine may affect blood sugar levels. Ask your healthcare provider if changes in diet or medicines are needed if you have diabetes. Patients and their families should watch out for worsening depression or thoughts of suicide. Also watch out for sudden changes in feelings such as feeling anxious, agitated, panicky, irritable, hostile, aggressive, impulsive, severely restless, overly excited and hyperactive, or not being able to sleep. If this  happens, especially at the beginning of treatment or after a change in dose, call your health care professional. Women should inform their health care provider if they wish to become pregnant or think they might be pregnant. Losing weight while pregnant is not advised and may cause harm to the unborn child. Talk to your health care provider for more information. What side effects may I notice from  receiving this medicine? Side effects that you should report to your doctor or health care professional as soon as possible:  allergic reactions like skin rash, itching or hives, swelling of the face, lips, or tongue  breathing problems  diarrhea that continues or is severe  lump or swelling on the neck  severe nausea  signs and symptoms of infection like fever or chills; cough; sore throat; pain or trouble passing urine  signs and symptoms of low blood sugar such as feeling anxious; confusion; dizziness; increased hunger; unusually weak or tired; increased sweating; shakiness; cold, clammy skin; irritable; headache; blurred vision; fast heartbeat; loss of consciousness  signs and symptoms of kidney injury like trouble passing urine or change in the amount of urine  trouble swallowing  unusual stomach upset or pain  vomiting Side effects that usually do not require medical attention (report to your doctor or health care professional if they continue or are bothersome):  constipation  decreased appetite  diarrhea  fatigue  headache  nausea  pain, redness, or irritation at site where injected  stomach upset  stuffy or runny nose This list may not describe all possible side effects. Call your doctor for medical advice about side effects. You may report side effects to FDA at 1-800-FDA-1088. Where should I keep my medicine? Keep out of the reach of children. Store unopened pen in a refrigerator between 2 and 8 degrees C (36 and 46 degrees F). Do not freeze or use if the medicine has been frozen. Protect from light and excessive heat. After you first use the pen, it can be stored at room temperature between 15 and 30 degrees C (59 and 86 degrees F) or in a refrigerator. Throw away your used pen after 30 days or after the expiration date, whichever comes first. Do not store your pen with the needle attached. If the needle is left on, medicine may leak from the pen. NOTE:  This sheet is a summary. It may not cover all possible information. If you have questions about this medicine, talk to your doctor, pharmacist, or health care provider.  2020 Elsevier/Gold Standard (2019-10-17 21:16:59)   Preventing Unhealthy Weight Gain, Adult Staying at a healthy weight is important to your overall health. When fat builds up in your body, you may become overweight or obese. Being overweight or obese increases your risk of developing certain health problems, such as heart disease, diabetes, sleeping problems, joint problems, and some types of cancer. Unhealthy weight gain is often the result of making unhealthy food choices or not getting enough exercise. You can make changes to your lifestyle to prevent obesity and stay as healthy as possible. What nutrition changes can be made?   Eat only as much as your body needs. To do this: ? Pay attention to signs that you are hungry or full. Stop eating as soon as you feel full. ? If you feel hungry, try drinking water first before eating. Drink enough water so your urine is clear or pale yellow. ? Eat smaller portions. Pay attention to portion sizes when eating out. ? Look at  serving sizes on food labels. Most foods contain more than one serving per container. ? Eat the recommended number of calories for your gender and activity level. For most active people, a daily total of 2,000 calories is appropriate. If you are trying to lose weight or are not very active, you may need to eat fewer calories. Talk with your health care provider or a diet and nutrition specialist (dietitian) about how many calories you need each day.  Choose healthy foods, such as: ? Fruits and vegetables. At each meal, try to fill at least half of your plate with fruits and vegetables. ? Whole grains, such as whole-wheat bread, brown rice, and quinoa. ? Lean meats, such as chicken or fish. ? Other healthy proteins, such as beans, eggs, or tofu. ? Healthy fats,  such as nuts, seeds, fatty fish, and olive oil. ? Low-fat or fat-free dairy products.  Check food labels, and avoid food and drinks that: ? Are high in calories. ? Have added sugar. ? Are high in sodium. ? Have saturated fats or trans fats.  Cook foods in healthier ways, such as by baking, broiling, or grilling.  Make a meal plan for the week, and shop with a grocery list to help you stay on track with your purchases. Try to avoid going to the grocery store when you are hungry.  When grocery shopping, try to shop around the outside of the store first, where the fresh foods are. Doing this helps you to avoid prepackaged foods, which can be high in sugar, salt (sodium), and fat. What lifestyle changes can be made?   Exercise for 30 or more minutes on 5 or more days each week. Exercising may include brisk walking, yard work, biking, running, swimming, and team sports like basketball and soccer. Ask your health care provider which exercises are safe for you.  Do muscle-strengthening activities, such as lifting weights or using resistance bands, on 2 or more days a week.  Do not use any products that contain nicotine or tobacco, such as cigarettes and e-cigarettes. If you need help quitting, ask your health care provider.  Limit alcohol intake to no more than 1 drink a day for nonpregnant women and 2 drinks a day for men. One drink equals 12 oz of beer, 5 oz of wine, or 1 oz of hard liquor.  Try to get 7-9 hours of sleep each night. What other changes can be made?  Keep a food and activity journal to keep track of: ? What you ate and how many calories you had. Remember to count the calories in sauces, dressings, and side dishes. ? Whether you were active, and what exercises you did. ? Your calorie, weight, and activity goals.  Check your weight regularly. Track any changes. If you notice you have gained weight, make changes to your diet or activity routine.  Avoid taking weight-loss  medicines or supplements. Talk to your health care provider before starting any new medicine or supplement.  Talk to your health care provider before trying any new diet or exercise plan. Why are these changes important? Eating healthy, staying active, and having healthy habits can help you to prevent obesity. Those changes also:  Help you manage stress and emotions.  Help you connect with friends and family.  Improve your self-esteem.  Improve your sleep.  Prevent long-term health problems. What can happen if changes are not made? Being obese or overweight can cause you to develop joint or bone problems, which can make  it hard for you to stay active or do activities you enjoy. Being obese or overweight also puts stress on your heart and lungs and can lead to health problems like diabetes, heart disease, and some cancers. Where to find more information Talk with your health care provider or a dietitian about healthy eating and healthy lifestyle choices. You may also find information from:  [place] Department of Agriculture, MyPlate: https://ball-collins.biz/  American Heart Association: www.heart.org  Centers for Disease Control and Prevention: FootballExhibition.com.br Summary  Staying at a healthy weight is important to your overall health. It helps you to prevent certain diseases and health problems, such as heart disease, diabetes, joint problems, sleep disorders, and some types of cancer.  Being obese or overweight can cause you to develop joint or bone problems, which can make it hard for you to stay active or do activities you enjoy.  You can prevent unhealthy weight gain by eating a healthy diet, exercising regularly, not smoking, limiting alcohol, and getting enough sleep.  Talk with your health care provider or a dietitian for guidance about healthy eating and healthy lifestyle choices. This information is not intended to replace advice given to you by your health care provider. Make sure you  discuss any questions you have with your health care provider. Document Revised: 12/15/2017 Document Reviewed: 01/18/2017 Elsevier Patient Education  2020 Elsevier Inc.   Edema  Edema is when you have too much fluid in your body or under your skin. Edema may make your legs, feet, and ankles swell up. Swelling is also common in looser tissues, like around your eyes. This is a common condition. It gets more common as you get older. There are many possible causes of edema. Eating too much salt (sodium) and being on your feet or sitting for a long time can cause edema in your legs, feet, and ankles. Hot weather may make edema worse. Edema is usually painless. Your skin may look swollen or shiny. Follow these instructions at home:  Keep the swollen body part raised (elevated) above the level of your heart when you are sitting or lying down.  Do not sit still or stand for a long time.  Do not wear tight clothes. Do not wear garters on your upper legs.  Exercise your legs. This can help the swelling go down.  Wear elastic bandages or support stockings as told by your doctor.  Eat a low-salt (low-sodium) diet to reduce fluid as told by your doctor.  Depending on the cause of your swelling, you may need to limit how much fluid you drink (fluid restriction).  Take over-the-counter and prescription medicines only as told by your doctor. Contact a doctor if:  Treatment is not working.  You have heart, liver, or kidney disease and have symptoms of edema.  You have sudden and unexplained weight gain. Get help right away if:  You have shortness of breath or chest pain.  You cannot breathe when you lie down.  You have pain, redness, or warmth in the swollen areas.  You have heart, liver, or kidney disease and get edema all of a sudden.  You have a fever and your symptoms get worse all of a sudden. Summary  Edema is when you have too much fluid in your body or under your skin.  Edema  may make your legs, feet, and ankles swell up. Swelling is also common in looser tissues, like around your eyes.  Raise (elevate) the swollen body part above the level of your  heart when you are sitting or lying down.  Follow your doctor's instructions about diet and how much fluid you can drink (fluid restriction). This information is not intended to replace advice given to you by your health care provider. Make sure you discuss any questions you have with your health care provider. Document Revised: 12/15/2017 Document Reviewed: 12/30/2016 Elsevier Patient Education  2020 [employer].

## 2020-07-03 NOTE — Progress Notes (Signed)
Patient ID: Sinahi Knights, female    DOB: 18-Jul-1991, 29 y.o.   MRN: [id number]  PCP: Danelle Berry, PA-C  Chief Complaint  Patient presents with  . Obesity    discuss weight loss    Subjective:   Kennetta Pavlovic is a 29 y.o. female, presents to clinic with CC of the following:  Here for Obesity, increasing weight, wants to discuss weight management:  Pt has always struggled with weight, her mom has similar - obesity and had weight-loss surgery 2 years ago  WT increased significantly in the past year She was about 200 lbs coming out of high school, first pregnancy in 2017, delivered 10/2016, she lost all her weight from first pregnancy, and then with most recent pregnancy delivered 01/2019, lost some of the baby weight in the month to two after (April 2020 was 214, but then with covid pandemic, staying at home more, less active and eating more, she steadily gained about 40 lbs  No hx of gestational diabetes HTN has been a problem over the past year No recent labs for cholesterol, blood sugar.  She has significant abdominal obesity  Wt Readings from Last 10 Encounters:  07/03/20 259 lb 6.4 oz (117.7 kg)  05/27/20 242 lb (109.8 kg)  04/15/20 241 lb (109.3 kg)  02/25/20 241 lb 9.6 oz (109.6 kg)  10/22/19 226 lb 4.8 oz (102.6 kg)  09/24/19 227 lb 9.6 oz (103.2 kg)  04/09/19 217 lb 12.8 oz (98.8 kg)  04/01/19 214 lb 4.8 oz (97.2 kg)  02/18/19 228 lb (103.4 kg)  02/13/19 228 lb (103.4 kg)   BMI Readings from Last 5 Encounters:  07/03/20 44.53 kg/m  05/27/20 41.54 kg/m  04/15/20 41.37 kg/m  02/25/20 41.47 kg/m  10/22/19 38.84 kg/m    Discussion about saxenda vs phentermine short term, nutritionist referral or medical weight management referral  Explained some requirements to get saxenda - pt will need to check with employee and health insurance on coverage and other available resources Obesity: This medication authorization requires the following:  (1)  the requested drug is being used as an adjunct to lifestyle modification (e.g., dietary or caloric restriction, exercise, behavioral support, community based program)  AND (2) one of the following  (a) BMI greater than or equal to 30 kilograms / square meter  OR  (b) both of the following   (i) BMI greater than or equal to 27 kilograms/ square meter   AND   (ii) additional disorder related to weight (high cholesterol, hypertension, diabetes, sleep apnea)  Nutrition: Low carb - 15 g of carbs or less cut out pasta, bread Cup of tea with dinner everything else cut out [employer] - food program weight and nutrition program Beach body -  shakes She is taking the stairs at work, more steps, trying to do crunches planks etc - shes trying to find exercise that she can do at home with her little ones.   She also has new pedal edema, no improvement when she walks more, she has some improvement when she elevates legs  Hypertension:   Have been monitoring at home, BP readings were 130-140's, they have been a little better over the past month SBP 120-130 No meds, working on diet and lifestyle.  No lightheadedness, hypotension, syncope. Blood pressure today is well controlled. BP Readings from Last 3 Encounters:  07/03/20 126/82  04/15/20 (!) 166/85  02/25/20 112/71  Pt denies CP, SOB, exertional sx, LE edema, palpitation, Ha's, visual disturbances  Patient Active Problem List   Diagnosis Date Noted  . Situational stress 05/27/2020  . Blood pressure elevated without history of HTN 05/27/2020  . Post-dates pregnancy 02/18/2019  . Current mild episode of major depressive disorder without prior episode (HCC) 06/12/2018  . Anxiety disorder 04/03/2018  . Stress headaches 04/03/2018  . Family history of diabetes mellitus 03/20/2018  . H/O trichomoniasis 12/14/2016  . ASCUS with positive high risk HPV 05/29/2016  . Morbid obesity with BMI of 40.0-44.9, adult (HCC) 04/26/2016  . H/O chlamydia  infection 01/27/2016      Current Outpatient Medications:  .  busPIRone (BUSPAR) 5 MG tablet, Take 1-2 tablets (5-10 mg total) by mouth 2 (two) times daily as needed (for anxiety/stress/agitation sx)., Disp: 60 tablet, Rfl: 2 .  escitalopram (LEXAPRO) 20 MG tablet, Take 1 tablet (20 mg total) by mouth daily., Disp: 90 tablet, Rfl: 3 .  levonorgestrel (MIRENA) 20 MCG/24HR IUD, 1 each by Intrauterine route once., Disp: , Rfl:    Allergies  Allergen Reactions  . Benzonatate Swelling     Social History   Tobacco Use  . Smoking status: Never Smoker  . Smokeless tobacco: Never Used  Vaping Use  . Vaping Use: Never used  Substance Use Topics  . Alcohol use: No    Alcohol/week: 1.0 standard drink    Types: 1 Standard drinks or equivalent per week  . Drug use: No      Chart Review Today: I personally reviewed active problem list, medication list, allergies, family history, social history, health maintenance, notes from last encounter, lab results, imaging with the patient/caregiver today.   Review of Systems 10 Systems reviewed and are negative for acute change except as noted in the HPI.     Objective:   Vitals:   07/03/20 0816  BP: 126/82  Pulse: 100  Resp: 18  Temp: (!) 97.1 F (36.2 C)  TempSrc: Temporal  SpO2: 94%  Weight: 259 lb 6.4 oz (117.7 kg)  Height: 5\' 4"  (1.626 m)    Body mass index is 44.53 kg/m.  Physical Exam Vitals and nursing note reviewed.  Constitutional:      General: She is not in acute distress.    Appearance: Normal appearance. She is well-developed. She is obese. She is not ill-appearing, toxic-appearing or diaphoretic.  HENT:     Head: Normocephalic and atraumatic.     Nose: Nose normal.  Eyes:     General:        Right eye: No discharge.        Left eye: No discharge.     Conjunctiva/sclera: Conjunctivae normal.  Neck:     Trachea: No tracheal deviation.  Cardiovascular:     Rate and Rhythm: Normal rate and regular rhythm.      Pulses: Normal pulses.     Heart sounds: Normal heart sounds. No murmur heard.  No friction rub. No gallop.      Comments: HR normal at time of exam (no tachycardia) Pulmonary:     Effort: Pulmonary effort is normal. No respiratory distress.     Breath sounds: Normal breath sounds. No stridor. No wheezing, rhonchi or rales.  Chest:     Chest wall: No tenderness.  Abdominal:     General: Bowel sounds are normal.     Palpations: Abdomen is soft.     Comments: Obese abd  Musculoskeletal:     Right lower leg: Edema present.     Left lower leg: Edema present.  Skin:  General: Skin is warm and dry.     Coloration: Skin is not jaundiced or pale.     Findings: No rash.  Neurological:     Mental Status: She is alert. Mental status is at baseline.     Motor: No abnormal muscle tone.     Coordination: Coordination normal.     Gait: Gait normal.  Psychiatric:        Mood and Affect: Mood normal.        Behavior: Behavior normal.      Depression screen [facility] 2/9 07/03/2020 05/27/2020 04/15/2020  Decreased Interest 0 1 0  Down, Depressed, Hopeless 0 0 1  PHQ - 2 Score 0 1 1  Altered sleeping 0 1 3  Tired, decreased energy 0 1 3  Change in appetite 0 0 0  Feeling bad or failure about yourself  0 0 0  Trouble concentrating 0 0 1  Moving slowly or fidgety/restless 0 0 0  Suicidal thoughts 0 0 0  PHQ-9 Score 0 3 8  Difficult doing work/chores Not difficult at all Not difficult at all Somewhat difficult  Some recent data might be hidden   phq neg and reviewed today      Assessment & Plan:     ICD-10-CM   1. Morbid obesity with BMI of 40.0-44.9, adult (HCC)  E66.01 CBC with Differential/Platelet   Z68.41 Lipid panel    Hemoglobin A1c    TSH    Comprehensive metabolic panel    Insulin, random    phentermine (ADIPEX-P) 37.5 MG tablet   see plan below  2. Blood pressure elevated without history of HTN  R03.0 Comprehensive metabolic panel   well controlled today, continue DASH and  monitor intermittently  3. Current mild episode of major depressive disorder without prior episode (HCC)  F32.0    MDD doing well currently, continue lexapro and buspar PRN  4. Anxiety disorder, unspecified type  F41.9    doing better with some improvement in some of her stressors  5. Encounter for weight management  Z76.89 CBC with Differential/Platelet    Lipid panel    Hemoglobin A1c    TSH    Comprehensive metabolic panel    Insulin, random    phentermine (ADIPEX-P) 37.5 MG tablet   educated on healthy diet, calorie restriction, increase exercise, meds discussed benefit/risk - phentermine short term and saxenda   6. Bilateral lower extremity edema  R60.0 TSH    Comprehensive metabolic panel   new, check TSH and CMP today, no sx associated concerning for CHF, low salt, exercise, elevate, compression socks, f/up if worsening  7. Abnormal weight gain  R63.5 Lipid panel    Hemoglobin A1c    TSH    Comprehensive metabolic panel    Insulin, random    phentermine (ADIPEX-P) 37.5 MG tablet   significan weight gain , 17 lbs in the past month - r/o hypothyroid, concern for metabolic syndrome, reduce net calories and increase exercise  8. Metabolic syndrome  E88.81 CBC with Differential/Platelet    Lipid panel    Hemoglobin A1c    TSH    Comprehensive metabolic panel    Insulin, random   abdominal obesity, rapid weight gain with BMI 40+, labs to assess cholesterol, insulin resistance    Pt has tried limiting carbs, has not counted calories - encouraged her to count calories and reduce - will need net negative calories for weight loss, explained basal metabolic rate - encouraged her to look up and be  sure to be net negative calories over each week, diet should not be over 2000 daily. Increase exercise/physical activity Try phentermine - discussed med, risk, SE, monitoring, pt verbalized understanding - will start 1st week half dose - watch for tachycardia, HTN, anxiety worsening - encouraged  her to hydrate and still work on healthy nutrition - she is doing a lot of programs where she has to buy their shakes - I encouraged more fruit veggies and lean meats - ref to dietician  One month f/up for med check  Would like to see if she has other metabolic dysfunction - labs ordered today, not done for a few years  She was encouraged to f/up with OBGYN about possibility ask about SE of birth control?    she asks for vaccination records and a letter to her work/insurance to say she is working on [employer]/obesity  Mohsin Crum, PA-C 07/03/20 8:26 AM

## 2020-07-04 LAB — COMPREHENSIVE METABOLIC PANEL
ALT: 15 IU/L (ref 0–32)
AST: 12 IU/L (ref 0–40)
Albumin/Globulin Ratio: 2.5 — ABNORMAL HIGH (ref 1.2–2.2)
Albumin: 4.5 g/dL (ref 3.9–5.0)
Alkaline Phosphatase: 76 IU/L (ref 48–121)
BUN/Creatinine Ratio: 17 (ref 9–23)
BUN: 12 mg/dL (ref 6–20)
Bilirubin Total: 0.6 mg/dL (ref 0.0–1.2)
CO2: 23 mmol/L (ref 20–29)
Calcium: 8.6 mg/dL — ABNORMAL LOW (ref 8.7–10.2)
Chloride: 104 mmol/L (ref 96–106)
Creatinine, Ser: 0.7 mg/dL (ref 0.57–1.00)
GFR calc Af Amer: 138 mL/min/{1.73_m2} (ref >59)
GFR calc non Af Amer: 120 mL/min/{1.73_m2} (ref >59)
Globulin, Total: 1.8 g/dL (ref 1.5–4.5)
Glucose: 98 mg/dL (ref 65–99)
Potassium: 4.7 mmol/L (ref 3.5–5.2)
Sodium: 139 mmol/L (ref 134–144)
Total Protein: 6.3 g/dL (ref 6.0–8.5)

## 2020-07-04 LAB — LIPID PANEL
Chol/HDL Ratio: 3 ratio (ref 0.0–4.4)
Cholesterol, Total: 169 mg/dL (ref 100–199)
HDL: 56 mg/dL (ref >39)
LDL Chol Calc (NIH): 99 mg/dL (ref 0–99)
Triglycerides: 73 mg/dL (ref 0–149)
VLDL Cholesterol Cal: 14 mg/dL (ref 5–40)

## 2020-07-04 LAB — CBC WITH DIFFERENTIAL/PLATELET
Basophils Absolute: 0 10*3/uL (ref 0.0–0.2)
Basos: 0 %
EOS (ABSOLUTE): 0.1 10*3/uL (ref 0.0–0.4)
Eos: 2 %
Hematocrit: 37.7 % (ref 34.0–46.6)
Hemoglobin: 12.2 g/dL (ref 11.1–15.9)
Immature Grans (Abs): 0 10*3/uL (ref 0.0–0.1)
Immature Granulocytes: 1 %
Lymphocytes Absolute: 2 10*3/uL (ref 0.7–3.1)
Lymphs: 27 %
MCH: 26 pg — ABNORMAL LOW (ref 26.6–33.0)
MCHC: 32.4 g/dL (ref 31.5–35.7)
MCV: 80 fL (ref 79–97)
Monocytes Absolute: 0.4 10*3/uL (ref 0.1–0.9)
Monocytes: 6 %
Neutrophils Absolute: 4.7 10*3/uL (ref 1.4–7.0)
Neutrophils: 64 %
Platelets: 316 10*3/uL (ref 150–450)
RBC: 4.69 x10E6/uL (ref 3.77–5.28)
RDW: 13.5 % (ref 11.7–15.4)
WBC: 7.2 10*3/uL (ref 3.4–10.8)

## 2020-07-04 LAB — TSH: TSH: 1.3 u[IU]/mL (ref 0.450–4.500)

## 2020-07-04 LAB — INSULIN, RANDOM: INSULIN: 23.1 u[IU]/mL (ref 2.6–24.9)

## 2020-07-04 LAB — HEMOGLOBIN A1C
Est. average glucose Bld gHb Est-mCnc: 114 mg/dL
Hgb A1c MFr Bld: 5.6 % (ref 4.8–5.6)

## 2020-07-06 NOTE — Progress Notes (Signed)
Records printed. 

## 2020-07-08 ENCOUNTER — Encounter: Payer: Self-pay | Admitting: Family Medicine

## 2020-08-03 ENCOUNTER — Ambulatory Visit (INDEPENDENT_AMBULATORY_CARE_PROVIDER_SITE_OTHER): Payer: BC Managed Care – PPO | Admitting: Family Medicine

## 2020-08-03 ENCOUNTER — Encounter: Payer: Self-pay | Admitting: Family Medicine

## 2020-08-03 ENCOUNTER — Other Ambulatory Visit: Payer: Self-pay

## 2020-08-03 VITALS — BP 112/84 | HR 100 | Temp 98.5°F | Resp 16 | Ht 64.0 in | Wt 255.7 lb

## 2020-08-03 DIAGNOSIS — Z7689 Persons encountering health services in other specified circumstances: Secondary | ICD-10-CM

## 2020-08-03 DIAGNOSIS — F419 Anxiety disorder, unspecified: Secondary | ICD-10-CM | POA: Diagnosis not present

## 2020-08-03 DIAGNOSIS — Z6841 Body Mass Index (BMI) 40.0 and over, adult: Secondary | ICD-10-CM

## 2020-08-03 DIAGNOSIS — F439 Reaction to severe stress, unspecified: Secondary | ICD-10-CM

## 2020-08-03 MED ORDER — BUSPIRONE HCL 5 MG PO TABS: 5.0000 mg | ORAL_TABLET | Freq: Two times a day (BID) | ORAL | 2 refills | Status: DC | PRN

## 2020-08-03 MED ORDER — PHENTERMINE HCL 37.5 MG PO TABS: 37.5000 mg | ORAL_TABLET | Freq: Every day | ORAL | 0 refills | Status: DC

## 2020-08-03 NOTE — Progress Notes (Signed)
Name: Rhonda Davies   MRN: [id number]    DOB: Jul 20, 1991   Date:08/03/2020       Progress Note  Chief Complaint  Patient presents with  . Follow-up    1 month  . Obesity    med f/u     Subjective:   Rhonda Davies is a 29 y.o. female, presents to clinic for weight/med check   249 at home 1000-1200 cal, 2x a week - walking in the evenings  Here pt is down 4 lbs since last OV Wt Readings from Last 5 Encounters:  08/03/20 255 lb 11.2 oz (116 kg)  07/03/20 259 lb 6.4 oz (117.7 kg)  05/27/20 242 lb (109.8 kg)  04/15/20 241 lb (109.3 kg)  02/25/20 241 lb 9.6 oz (109.6 kg)   BMI Readings from Last 5 Encounters:  08/03/20 43.89 kg/m  07/03/20 44.53 kg/m  05/27/20 41.54 kg/m  04/15/20 41.37 kg/m  02/25/20 41.47 kg/m    Pt was able to get phentermine- taking, mostly tolerating, she has had a little bit of rapid HR with activity or once when rushing and 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  busPIRone (BUSPAR) 5 MG tablet, Take 1-2 tablets (5-10 mg total) by mouth 2 (two) times daily as needed (for anxiety/stress/agitation sx)., Disp: 60 tablet, Rfl: 2 .  escitalopram (LEXAPRO) 20 MG tablet, Take 1 tablet (20 mg total) by mouth daily., Disp: 90 tablet, Rfl: 3 .  levonorgestrel (MIRENA) 20 MCG/24HR IUD, 1 each by Intrauterine route once., Disp: , Rfl:  .  phentermine (ADIPEX-P) 37.5 MG tablet, Take 1 tablet (37.5 mg total) by mouth daily before breakfast., Disp: 30 tablet, Rfl: 0  Patient Active Problem List   Diagnosis Date Noted  . Situational stress 05/27/2020  . Blood pressure elevated without history of HTN 05/27/2020  . Post-dates pregnancy 02/18/2019  . Current mild episode of major depressive disorder  without prior episode (HCC) 06/12/2018  . Anxiety disorder 04/03/2018  . Stress headaches 04/03/2018  . Family history of diabetes mellitus 03/20/2018  . H/O trichomoniasis 12/14/2016  . ASCUS with positive high risk HPV 05/29/2016  . Morbid obesity with BMI of 40.0-44.9, adult (HCC) 04/26/2016  . H/O chlamydia infection 01/27/2016    Past Surgical History:  Procedure Laterality Date  . CYST REMOVED  2013   IN NECK  . TONSILLECTOMY AND ADENOIDECTOMY  2007    Family History  Problem Relation Age of Onset  . Heart disease Father   . Anxiety disorder Father   . Heart disease Maternal Grandfather   . Diabetes Paternal Grandmother   . Heart disease Paternal Grandfather   . Anxiety disorder Paternal Aunt   . Bipolar disorder Paternal Aunt   . Diabetes Paternal Aunt   . Anxiety disorder Brother   . Aneurysm Paternal Uncle   . Breast cancer Neg Hx   . Colon cancer Neg Hx   . Ovarian cancer Neg Hx     Social History   Tobacco Use  . Smoking status: Never Smoker  . Smokeless tobacco: Never Used  Vaping Use  . Vaping Use: Never used  Substance Use Topics  . Alcohol use: No    Alcohol/week: 1.0 standard drink  Types: 1 Standard drinks or equivalent per week  . Drug use: No     Allergies  Allergen Reactions  . Benzonatate Swelling    Health Maintenance  Topic Date Due  . Hepatitis C Screening  Never done  . INFLUENZA VACCINE  07/26/2020  . COVID-19 Vaccine (1) 08/19/2020 (Originally 08/30/2005)  . PAP-Cervical Cytology Screening  01/25/2021  . PAP SMEAR-Modifier  01/25/2021  . TETANUS/TDAP  11/21/2028  . HIV Screening  Completed    Chart Review Today: I personally reviewed active problem list, medication list, allergies, family history, social history, health maintenance, notes from last encounter, lab results, imaging with the patient/caregiver today.   Review of Systems  10 Systems reviewed and are negative for acute change except as noted in the  HPI.  Objective:   Vitals:   08/03/20 0919  BP: 112/84  Pulse: 100  Resp: 16  Temp: 98.5 F (36.9 C)  TempSrc: Temporal  SpO2: 95%  Weight: 255 lb 11.2 oz (116 kg)  Height: 5\' 4"  (1.626 m)    Body mass index is 43.89 kg/m.  Physical Exam Vitals and nursing note reviewed.  Constitutional:      General: She is not in acute distress.    Appearance: Normal appearance. She is obese. She is not ill-appearing, toxic-appearing or diaphoretic.  HENT:     Head: Normocephalic and atraumatic.     Right Ear: External ear normal.     Left Ear: External ear normal.     Mouth/Throat:     Mouth: Mucous membranes are moist.     Pharynx: Oropharynx is clear.  Eyes:     General:        Right eye: No discharge.        Left eye: No discharge.     Conjunctiva/sclera: Conjunctivae normal.  Cardiovascular:     Rate and Rhythm: Regular rhythm. Tachycardia present.     Pulses: Normal pulses.     Heart sounds: Normal heart sounds. No murmur heard.  No friction rub. No gallop.   Pulmonary:     Effort: Pulmonary effort is normal. No respiratory distress.     Breath sounds: Normal breath sounds. No stridor. No wheezing.  Skin:    General: Skin is warm and dry.     Coloration: Skin is not jaundiced or pale.  Neurological:     Mental Status: She is alert. Mental status is at baseline. She is disoriented.  Psychiatric:        Mood and Affect: Mood normal.        Behavior: Behavior normal.         Assessment & Plan:   1. Morbid obesity with BMI of 40.0-44.9, adult [facility]) Patient had 4 pound weight loss with phentermine she is tolerating side effects encouraged her to watch her heart rate and hydration status, refill sent in today - phentermine (ADIPEX-P) 37.5 MG tablet; Take 1 tablet (37.5 mg total) by mouth daily before breakfast.  Dispense: 30 tablet; Refill: 0  2. Anxiety disorder, unspecified type Refill on meds -she does note that her anxiety feels better controlled with increase  of Lexapro dose to 20 mg and BuSpar as needed - busPIRone (BUSPAR) 5 MG tablet; Take 1-2 tablets (5-10 mg total) by mouth 2 (two) times daily as needed (for anxiety/stress/agitation sx).  Dispense: 60 tablet; Refill: 2  3. Situational stress Refill on BuSpar - busPIRone (BUSPAR) 5 MG tablet; Take 1-2 tablets (5-10 mg total) by mouth 2 (two) times daily as  needed (for anxiety/stress/agitation sx).  Dispense: 60 tablet; Refill: 2  4.  Encounter for weight management Some successful weight loss encouraged her to increase her physical activity and continue to monitor her caloric intake also again reviewed side effects of medication and concerns which she should seek immediate follow-up, she verbalizes understanding of concerning signs and symptoms - phentermine (ADIPEX-P) 37.5 MG tablet; Take 1 tablet (37.5 mg total) by mouth daily before breakfast.  Dispense: 30 tablet; Refill: 0   No follow-ups on file.   Danelle Berry, PA-C 08/03/20 9:29 AM

## 2020-08-10 ENCOUNTER — Encounter: Payer: Self-pay | Admitting: Family Medicine

## 2020-09-08 NOTE — Progress Notes (Signed)
Name: Rhonda Davies   MRN: [id number]    DOB: May 16, 1991   Date:09/09/2020       Progress Note  Chief Complaint  Patient presents with  . Follow-up    1 month recheck weight     Subjective:   Rhonda Davies is a 29 y.o. female, presents to clinic for   Weight check taking phentermine  Down another 4 lbs, down 8 since starting phentermine. She's noticed some less effectiveness and she took the weekend off of meds, but it does seem to be helping with controlling appetite and with loosing weight.  She is still working very diligently on eating fruits, veggies and meats, still very low calorie 1200   45 min a nights walking    Wt Readings from Last 5 Encounters:  09/09/20 251 lb (113.9 kg)  08/03/20 255 lb 11.2 oz (116 kg)  07/03/20 259 lb 6.4 oz (117.7 kg)  05/27/20 242 lb (109.8 kg)  04/15/20 241 lb (109.3 kg)   BMI Readings from Last 5 Encounters:  09/09/20 43.08 kg/m  08/03/20 43.89 kg/m  07/03/20 44.53 kg/m  05/27/20 41.54 kg/m  04/15/20 41.37 kg/m    Continues to deny any CP, rapid HR, high BP of other SE or concerns     Current Outpatient Medications:  .  busPIRone (BUSPAR) 5 MG tablet, Take 1-2 tablets (5-10 mg total) by mouth 2 (two) times daily as needed (for anxiety/stress/agitation sx)., Disp: 60 tablet, Rfl: 2 .  escitalopram (LEXAPRO) 20 MG tablet, Take 1 tablet (20 mg total) by mouth daily., Disp: 90 tablet, Rfl: 3 .  levonorgestrel (MIRENA) 20 MCG/24HR IUD, 1 each by Intrauterine route once., Disp: , Rfl:  .  phentermine (ADIPEX-P) 37.5 MG tablet, Take 1 tablet (37.5 mg total) by mouth daily before breakfast., Disp: 30 tablet, Rfl: 0  Patient Active Problem List   Diagnosis Date Noted  . Situational stress 05/27/2020  . Blood pressure elevated without history of HTN 05/27/2020  . Post-dates pregnancy 02/18/2019  . Current mild episode of major depressive disorder without prior episode (HCC) 06/12/2018  . Anxiety disorder  04/03/2018  . Stress headaches 04/03/2018  . Family history of diabetes mellitus 03/20/2018  . H/O trichomoniasis 12/14/2016  . ASCUS with positive high risk HPV 05/29/2016  . Morbid obesity with BMI of 40.0-44.9, adult (HCC) 04/26/2016  . H/O chlamydia infection 01/27/2016    Past Surgical History:  Procedure Laterality Date  . CYST REMOVED  2013   IN NECK  . TONSILLECTOMY AND ADENOIDECTOMY  2007    Family History  Problem Relation Age of Onset  . Heart disease Father   . Anxiety disorder Father   . Heart disease Maternal Grandfather   . Diabetes Paternal Grandmother   . Heart disease Paternal Grandfather   . Anxiety disorder Paternal Aunt   . Bipolar disorder Paternal Aunt   . Diabetes Paternal Aunt   . Anxiety disorder Brother   . Aneurysm Paternal Uncle   . Breast cancer Neg Hx   . Colon cancer Neg Hx   . Ovarian cancer Neg Hx     Social History   Tobacco Use  . Smoking status: Never Smoker  . Smokeless tobacco: Never Used  Vaping Use  . Vaping Use: Never used  Substance Use Topics  . Alcohol use: No    Alcohol/week: 1.0 standard drink    Types: 1 Standard drinks or equivalent per week  . Drug use: No     Allergies  Allergen Reactions  . Benzonatate Swelling    Health Maintenance  Topic Date Due  . Hepatitis C Screening  Never done  . COVID-19 Vaccine (1) 09/25/2020 (Originally 08/30/2005)  . INFLUENZA VACCINE  03/25/2021 (Originally 07/26/2020)  . PAP-Cervical Cytology Screening  01/25/2021  . PAP SMEAR-Modifier  01/25/2021  . TETANUS/TDAP  11/21/2028  . HIV Screening  Completed    Chart Review Today: I personally reviewed active problem list, medication list, allergies, family history, social history, health maintenance, notes from last encounter, lab results, imaging with the patient/caregiver today.   Review of Systems  10 Systems reviewed and are negative for acute change except as noted in the HPI.    Objective:   Vitals:   09/09/20  0840  BP: 118/82  Pulse: 98  Resp: 16  Temp: 98.1 F (36.7 C)  TempSrc: Oral  SpO2: 98%  Weight: 251 lb (113.9 kg)  Height: 5\' 4"  (1.626 m)    Body mass index is 43.08 kg/m.  Physical Exam Vitals and nursing note reviewed.  Constitutional:      General: She is not in acute distress.    Appearance: Normal appearance. She is obese. She is not ill-appearing, toxic-appearing or diaphoretic.  HENT:     Head: Normocephalic and atraumatic.     Right Ear: External ear normal.     Left Ear: External ear normal.  Eyes:     General:        Right eye: No discharge.        Left eye: No discharge.     Conjunctiva/sclera: Conjunctivae normal.  Cardiovascular:     Rate and Rhythm: Normal rate and regular rhythm.     Pulses: Normal pulses.     Heart sounds: Normal heart sounds.  Pulmonary:     Effort: Pulmonary effort is normal.     Breath sounds: Normal breath sounds.  Neurological:     Mental Status: She is alert.     Gait: Gait normal.  Psychiatric:        Mood and Affect: Mood normal.        Behavior: Behavior normal.         Assessment & Plan:      ICD-10-CM   1. Morbid obesity with BMI of 40.0-44.9, adult (HCC)  E66.01    Z68.41    working hard on diet and exercise, lost 4 more lbs  2. Encounter for weight management  Z76.89 phentermine (ADIPEX-P) 37.5 MG tablet   continue healthy diet, increase exercise intensity/intervals/weights, meds discussed benefit/risk - phentermine short term - last Rx   Encouraged pt to let me know if she would like dietician or medical weight management referral over the next 3-4 months.  Return in about 4 months (around 01/09/2021) for Routine follow-up .   01/11/2021, PA-C 09/09/20 9:02 AM

## 2020-09-09 ENCOUNTER — Encounter: Payer: Self-pay | Admitting: Family Medicine

## 2020-09-09 ENCOUNTER — Other Ambulatory Visit: Payer: Self-pay

## 2020-09-09 ENCOUNTER — Ambulatory Visit: Payer: BC Managed Care – PPO | Admitting: Family Medicine

## 2020-09-09 VITALS — BP 118/82 | HR 98 | Temp 98.1°F | Resp 16 | Ht 64.0 in | Wt 251.0 lb

## 2020-09-09 DIAGNOSIS — Z6841 Body Mass Index (BMI) 40.0 and over, adult: Secondary | ICD-10-CM | POA: Diagnosis not present

## 2020-09-09 DIAGNOSIS — Z7689 Persons encountering health services in other specified circumstances: Secondary | ICD-10-CM

## 2020-09-09 MED ORDER — PHENTERMINE HCL 37.5 MG PO TABS: 37.5000 mg | ORAL_TABLET | Freq: Every day | ORAL | 0 refills | Status: DC

## 2020-09-09 NOTE — Patient Instructions (Signed)

## 2020-09-29 ENCOUNTER — Other Ambulatory Visit: Payer: Self-pay

## 2020-09-29 ENCOUNTER — Encounter: Payer: Self-pay | Admitting: Obstetrics and Gynecology

## 2020-09-29 ENCOUNTER — Ambulatory Visit (INDEPENDENT_AMBULATORY_CARE_PROVIDER_SITE_OTHER): Payer: BC Managed Care – PPO

## 2020-09-29 ENCOUNTER — Ambulatory Visit: Payer: BC Managed Care – PPO | Admitting: Obstetrics and Gynecology

## 2020-09-29 ENCOUNTER — Other Ambulatory Visit: Payer: Self-pay | Admitting: Obstetrics and Gynecology

## 2020-09-29 VITALS — BP 137/76 | HR 88 | Ht 64.0 in | Wt 250.8 lb

## 2020-09-29 DIAGNOSIS — Z30431 Encounter for routine checking of intrauterine contraceptive device: Secondary | ICD-10-CM

## 2020-09-29 DIAGNOSIS — N921 Excessive and frequent menstruation with irregular cycle: Secondary | ICD-10-CM | POA: Diagnosis not present

## 2020-09-29 DIAGNOSIS — Z975 Presence of (intrauterine) contraceptive device: Secondary | ICD-10-CM | POA: Diagnosis not present

## 2020-09-29 DIAGNOSIS — N9489 Other specified conditions associated with female genital organs and menstrual cycle: Secondary | ICD-10-CM

## 2020-09-29 NOTE — Progress Notes (Signed)
HPI:      [name] is a 29 y.o. [phone] who LMP was No LMP recorded. (Menstrual status: IUD).  Subjective:   She presents today because she had 2 weeks of bleeding and cramping and she was concerned that her IUD was misplaced.  Her bleeding and cramping has now resolved prior to her exam. She underwent ultrasonography today.  Please see below    Hx: The following portions of the patient's history were reviewed and updated as appropriate:             She  has a past medical history of Anxiety, H/O chlamydia infection (01/27/2016), H/O trichomoniasis (12/14/2016), Kidney stone, Left groin pain, Personal history UTI, and Pregnancy induced hypertension. She does not have any pertinent problems on file. She  has a past surgical history that includes Tonsillectomy and adenoidectomy (2007) and CYST REMOVED (2013). Her family history includes Aneurysm in her paternal uncle; Anxiety disorder in her brother, father, and paternal aunt; Bipolar disorder in her paternal aunt; Diabetes in her paternal aunt and paternal grandmother; Heart disease in her father, maternal grandfather, and paternal grandfather. She  reports that she has never smoked. She has never used smokeless tobacco. She reports that she does not drink alcohol and does not use drugs. She has a current medication list which includes the following prescription(s): buspirone, escitalopram, levonorgestrel, and phentermine. She is allergic to benzonatate.       Review of Systems:  Review of Systems  Constitutional: Denied constitutional symptoms, night sweats, recent illness, fatigue, fever, insomnia and weight loss.  Eyes: Denied eye symptoms, eye pain, photophobia, vision change and visual disturbance.  Ears/Nose/Throat/Neck: Denied ear, nose, throat or neck symptoms, hearing loss, nasal discharge, sinus congestion and sore throat.  Cardiovascular: Denied cardiovascular symptoms, arrhythmia, chest pain/pressure, edema, exercise  intolerance, orthopnea and palpitations.  Respiratory: Denied pulmonary symptoms, asthma, pleuritic pain, productive sputum, cough, dyspnea and wheezing.  Gastrointestinal: Denied, gastro-esophageal reflux, melena, nausea and vomiting.  Genitourinary: See HPI for additional information.  Musculoskeletal: Denied musculoskeletal symptoms, stiffness, swelling, muscle weakness and myalgia.  Dermatologic: Denied dermatology symptoms, rash and scar.  Neurologic: Denied neurology symptoms, dizziness, headache, neck pain and syncope.  Psychiatric: Denied psychiatric symptoms, anxiety and depression.  Endocrine: Denied endocrine symptoms including hot flashes and night sweats.   Meds:   Current Outpatient Medications on File Prior to Visit  Medication Sig Dispense Refill  . busPIRone (BUSPAR) 5 MG tablet Take 1-2 tablets (5-10 mg total) by mouth 2 (two) times daily as needed (for anxiety/stress/agitation sx). 60 tablet 2  . escitalopram (LEXAPRO) 20 MG tablet Take 1 tablet (20 mg total) by mouth daily. 90 tablet 3  . levonorgestrel (MIRENA) 20 MCG/24HR IUD 1 each by Intrauterine route once.    . phentermine (ADIPEX-P) 37.5 MG tablet Take 1 tablet (37.5 mg total) by mouth daily before breakfast. 30 tablet 0   No current facility-administered medications on file prior to visit.      Objective:     Vitals:   09/29/20 1508  BP: 137/76  Pulse: 88   Filed Weights   09/29/20 1508  Weight: 250 lb 12.8 oz (113.8 kg)              Ultrasound findings: The uterus is anteverted and measures 9.0 x 3.9 x 4.7 cm. Echo texture is homogenous without evidence of focal masses. Within the uterus are multiple suspected fibroids measuring:  The Endometrium measures 6 mm.IUD seen centrally located within the endometrium.  Right Ovary measures 3.0 x 2.3 x 2.5 cm. It is normal in appearance. Hypoechoic lesion with smooth walls and good through transmission                                                                                                                         measuring 2.0 x 1.3 x 2.0 cm Left Ovary measures 2.4 x 1.6 x 2.4 cm. It is normal in appearance. Survey of the adnexa demonstrates no adnexal masses. There is no free fluid in the cul de sac.  Impression: 1. Rt ovarian simple cyst as described above. 2. IUD is seen within endometrium.    Assessment:    [id number] Patient Active Problem List   Diagnosis Date Noted  . Situational stress 05/27/2020  . Blood pressure elevated without history of HTN 05/27/2020  . Post-dates pregnancy 02/18/2019  . Current mild episode of major depressive disorder without prior episode (HCC) 06/12/2018  . Anxiety disorder 04/03/2018  . Stress headaches 04/03/2018  . Family history of diabetes mellitus 03/20/2018  . H/O trichomoniasis 12/14/2016  . ASCUS with positive high risk HPV 05/29/2016  . Morbid obesity with BMI of 40.0-44.9, adult (HCC) 04/26/2016  . H/O chlamydia infection 01/27/2016     1. Breakthrough bleeding associated with intrauterine device (IUD)   2. Uterine cramping     Bleeding and cramping have now resolved.  IUD correctly positioned within the uterus.  Ovarian cyst likely small functional cyst.   Plan:            1.  Expectant management.  I expect her to have limited menses going forward with her IUD.  If she continues to experience significant bleeding in future cramping she will inform us.  Orders No orders of the defined types were placed in this encounter.   No orders of the defined types were placed in this encounter.     F/U  Return for Annual Physical. I spent 20 minutes involved in the care of this patient preparing to see the patient by obtaining and reviewing her medical history (including labs, imaging tests and prior procedures), documenting clinical information in the electronic health record (EHR), counseling and coordinating care plans, writing and sending prescriptions, ordering tests or  procedures and directly communicating with the patient by discussing pertinent items from her history and physical exam as well as detailing my assessment and plan as noted above so that she has an informed understanding.  All of her questions were answered.  [name], M.D. 09/29/2020 3:23 PM

## 2020-10-02 ENCOUNTER — Encounter: Payer: Self-pay | Admitting: Family Medicine

## 2020-10-12 ENCOUNTER — Telehealth: Payer: Self-pay

## 2020-10-12 ENCOUNTER — Encounter: Payer: Self-pay | Admitting: Family Medicine

## 2020-10-12 NOTE — Telephone Encounter (Signed)
Please verify paperwork

## 2020-10-12 NOTE — Telephone Encounter (Signed)
Copied from CRM [id number]. Topic: General - Inquiry >> Oct 12, 2020  1:58 PM Adrian Prince D wrote: Reason for CRM: Patient called and ask if the paperwork that was completed and she was suppose to pick up can be placed in her mychart account instead of her coming to pick it up. If you have any questions please call [phone]. Please advise

## 2020-10-13 NOTE — Telephone Encounter (Signed)
Rhonda Davies has scanned it into the patient mychart

## 2021-01-07 ENCOUNTER — Ambulatory Visit: Payer: BC Managed Care – PPO | Admitting: Family Medicine

## 2021-01-08 DIAGNOSIS — Z20828 Contact with and (suspected) exposure to other viral communicable diseases: Secondary | ICD-10-CM | POA: Diagnosis not present

## 2021-01-12 ENCOUNTER — Ambulatory Visit: Payer: BC Managed Care – PPO | Admitting: Family Medicine

## 2021-01-12 ENCOUNTER — Encounter: Payer: Self-pay | Admitting: Family Medicine

## 2021-01-12 ENCOUNTER — Other Ambulatory Visit: Payer: Self-pay

## 2021-01-12 VITALS — BP 122/62 | HR 93 | Temp 98.3°F | Resp 14 | Ht 64.0 in | Wt 261.7 lb

## 2021-01-12 DIAGNOSIS — Z6841 Body Mass Index (BMI) 40.0 and over, adult: Secondary | ICD-10-CM

## 2021-01-12 DIAGNOSIS — Z7689 Persons encountering health services in other specified circumstances: Secondary | ICD-10-CM

## 2021-01-12 DIAGNOSIS — F439 Reaction to severe stress, unspecified: Secondary | ICD-10-CM

## 2021-01-12 DIAGNOSIS — Z23 Encounter for immunization: Secondary | ICD-10-CM

## 2021-01-12 DIAGNOSIS — F419 Anxiety disorder, unspecified: Secondary | ICD-10-CM | POA: Diagnosis not present

## 2021-01-12 MED ORDER — PHENTERMINE HCL 37.5 MG PO TABS: 37.5000 mg | ORAL_TABLET | Freq: Every day | ORAL | 0 refills | Status: DC

## 2021-01-12 MED ORDER — BUSPIRONE HCL 5 MG PO TABS: 5.0000 mg | ORAL_TABLET | Freq: Two times a day (BID) | ORAL | 2 refills | Status: DC | PRN

## 2021-01-12 NOTE — Progress Notes (Signed)
Name: Rhonda Davies   MRN: [id number]    DOB: 11-07-1991   Date:01/12/2021       Progress Note  Chief Complaint  Patient presents with  . Weight Check     Subjective:   Rhonda Davies is a 30 y.o. female, presents to clinic for med weight management   Last Rx on phentermine was September  Was on full dose from July to sept Trying to do intermittent fasting Walking and exercising every night - walks at least 1.5 - 2 miles every day  Wt Readings from Last 10 Encounters:  01/12/21 261 lb 11.2 oz (118.7 kg)  09/29/20 250 lb 12.8 oz (113.8 kg)  09/09/20 251 lb (113.9 kg)  08/03/20 255 lb 11.2 oz (116 kg)  07/03/20 259 lb 6.4 oz (117.7 kg)  05/27/20 242 lb (109.8 kg)  04/15/20 241 lb (109.3 kg)  02/25/20 241 lb 9.6 oz (109.6 kg)  10/22/19 226 lb 4.8 oz (102.6 kg)  09/24/19 227 lb 9.6 oz (103.2 kg)   BMI Readings from Last 5 Encounters:  01/12/21 44.92 kg/m  09/29/20 43.05 kg/m  09/09/20 43.08 kg/m  08/03/20 43.89 kg/m  07/03/20 44.53 kg/m     BP Readings from Last 3 Encounters:  01/12/21 122/62  09/29/20 137/76  09/09/20 118/82   Pulse Readings from Last 3 Encounters:  01/12/21 93  09/29/20 88  09/09/20 98   Will check with employer for any nutritionist or wellness resources   Depression screen [facility] 2/9 01/12/2021 09/09/2020 08/03/2020  Decreased Interest 0 0 0  Down, Depressed, Hopeless 0 0 0  PHQ - 2 Score 0 0 0  Altered sleeping 0 0 0  Tired, decreased energy 0 0 0  Change in appetite 0 0 0  Feeling bad or failure about yourself  0 0 0  Trouble concentrating 0 0 0  Moving slowly or fidgety/restless 0 0 0  Suicidal thoughts 0 0 0  PHQ-9 Score 0 0 0  Difficult doing work/chores Not difficult at all Not difficult at all Not difficult at all  Some recent data might be hidden   GAD 7 : Generalized Anxiety Score 08/03/2020 07/03/2020 06/12/2018 05/07/2018  Nervous, Anxious, on Edge 1 0 1 3  Control/stop worrying 1 0 2 3  Worry too much - different  things 1 0 2 3  Trouble relaxing 0 0 2 3  Restless 0 0 2 3  Easily annoyed or irritable 1 0 2 3  Afraid - awful might happen 0 0 1 3  Total GAD 7 Score 4 0 12 21  Anxiety Difficulty Not difficult at all Not difficult at all - Somewhat difficult   On lexapro 20 mg daily, and using buspar 5-10 mg one to two times a day as needed depending on stress of each day or week.  Restarting school, starting clinicals, continuing to work at [employer] - 10 hours/4 d a week, 20 hours clinicals      Current Outpatient Medications:  .  busPIRone (BUSPAR) 5 MG tablet, Take 1-2 tablets (5-10 mg total) by mouth 2 (two) times daily as needed (for anxiety/stress/agitation sx)., Disp: 60 tablet, Rfl: 2 .  escitalopram (LEXAPRO) 20 MG tablet, Take 1 tablet (20 mg total) by mouth daily., Disp: 90 tablet, Rfl: 3 .  levonorgestrel (MIRENA) 20 MCG/24HR IUD, 1 each by Intrauterine route once., Disp: , Rfl:  .  phentermine (ADIPEX-P) 37.5 MG tablet, Take 1 tablet (37.5 mg total) by mouth daily before breakfast., Disp: 30  tablet, Rfl: 0  Patient Active Problem List   Diagnosis Date Noted  . Situational stress 05/27/2020  . Blood pressure elevated without history of HTN 05/27/2020  . Post-dates pregnancy 02/18/2019  . Current mild episode of major depressive disorder without prior episode (HCC) 06/12/2018  . Anxiety disorder 04/03/2018  . Stress headaches 04/03/2018  . Family history of diabetes mellitus 03/20/2018  . H/O trichomoniasis 12/14/2016  . ASCUS with positive high risk HPV 05/29/2016  . Morbid obesity with BMI of 40.0-44.9, adult (HCC) 04/26/2016  . H/O chlamydia infection 01/27/2016    Past Surgical History:  Procedure Laterality Date  . CYST REMOVED  2013   IN NECK  . TONSILLECTOMY AND ADENOIDECTOMY  2007    Family History  Problem Relation Age of Onset  . Heart disease Father   . Anxiety disorder Father   . Heart disease Maternal Grandfather   . Diabetes Paternal Grandmother   . Heart  disease Paternal Grandfather   . Anxiety disorder Paternal Aunt   . Bipolar disorder Paternal Aunt   . Diabetes Paternal Aunt   . Anxiety disorder Brother   . Aneurysm Paternal Uncle   . Breast cancer Neg Hx   . Colon cancer Neg Hx   . Ovarian cancer Neg Hx     Social History   Tobacco Use  . Smoking status: Never Smoker  . Smokeless tobacco: Never Used  Vaping Use  . Vaping Use: Never used  Substance Use Topics  . Alcohol use: No    Alcohol/week: 1.0 standard drink    Types: 1 Standard drinks or equivalent per week  . Drug use: No     Allergies  Allergen Reactions  . Benzonatate Swelling    Health Maintenance  Topic Date Due  . Hepatitis C Screening  Never done  . COVID-19 Vaccine (1) Never done  . PAP-Cervical Cytology Screening  01/25/2021  . PAP SMEAR-Modifier  01/25/2021  . TETANUS/TDAP  11/21/2028  . INFLUENZA VACCINE  Completed  . HIV Screening  Completed    Chart Review Today: I personally reviewed active problem list, medication list, allergies, family history, social history, health maintenance, notes from last encounter, lab results, imaging with the patient/caregiver today.   Review of Systems  Constitutional: Negative.   HENT: Negative.   Eyes: Negative.   Respiratory: Negative.   Cardiovascular: Negative.   Gastrointestinal: Negative.   Endocrine: Negative.   Genitourinary: Negative.   Musculoskeletal: Negative.   Skin: Negative.   Allergic/Immunologic: Negative.   Neurological: Negative.   Hematological: Negative.   Psychiatric/Behavioral: Negative.   All other systems reviewed and are negative.    Objective:   Vitals:   01/12/21 1442  BP: 122/62  Pulse: 93  Resp: 14  Temp: 98.3 F (36.8 C)  SpO2: 99%  Weight: 261 lb 11.2 oz (118.7 kg)  Height: 5\' 4"  (1.626 m)    Body mass index is 44.92 kg/m.  Physical Exam Vitals and nursing note reviewed.  Constitutional:      General: She is not in acute distress.    Appearance:  Normal appearance. She is well-developed. She is obese. She is not ill-appearing, toxic-appearing or diaphoretic.     Interventions: Face mask in place.  HENT:     Head: Normocephalic and atraumatic.     Right Ear: External ear normal.     Left Ear: External ear normal.  Eyes:     General: Lids are normal. No scleral icterus.       Right  eye: No discharge.        Left eye: No discharge.     Conjunctiva/sclera: Conjunctivae normal.  Neck:     Trachea: Phonation normal. No tracheal deviation.  Cardiovascular:     Rate and Rhythm: Normal rate and regular rhythm.     Pulses: Normal pulses.          Radial pulses are 2+ on the right side and 2+ on the left side.       Posterior tibial pulses are 2+ on the right side and 2+ on the left side.     Heart sounds: Normal heart sounds. No murmur heard. No friction rub. No gallop.   Pulmonary:     Effort: Pulmonary effort is normal. No respiratory distress.     Breath sounds: Normal breath sounds. No stridor. No wheezing, rhonchi or rales.  Chest:     Chest wall: No tenderness.  Abdominal:     General: Bowel sounds are normal. There is no distension.     Palpations: Abdomen is soft.  Musculoskeletal:     Right lower leg: No edema.     Left lower leg: No edema.  Skin:    General: Skin is warm and dry.     Coloration: Skin is not jaundiced or pale.     Findings: No rash.  Neurological:     Mental Status: She is alert.     Motor: No abnormal muscle tone.     Gait: Gait normal.  Psychiatric:        Mood and Affect: Mood normal.        Speech: Speech normal.        Behavior: Behavior normal.         Assessment & Plan:     ICD-10-CM   1. Morbid obesity with BMI of 40.0-44.9, adult (HCC)  E66.01 phentermine (ADIPEX-P) 37.5 MG tablet   Z68.41   2. Need for influenza vaccination  Z23 Flu Vaccine QUAD 6+ mos PF IM (Fluarix Quad PF)  3. Encounter for weight management  Z76.89 phentermine (ADIPEX-P) 37.5 MG tablet  4. Anxiety disorder,  unspecified type  F41.9 busPIRone (BUSPAR) 5 MG tablet   anxiety sx worsening, more stressed, anxious, edgy and "short" she has been told.  increase lexapro to 20 mg add buspar 5-10 TID prn  5. Situational stress  F43.9 busPIRone (BUSPAR) 5 MG tablet   two young children, in school full time, helping care for her father with health issues, very busy, stressed and anxious     F/up in 1-2 months for weight check/refill  Danelle Berry, PA-C 01/12/21 2:47 PM

## 2021-01-22 ENCOUNTER — Encounter: Payer: Self-pay | Admitting: Family Medicine

## 2021-02-12 ENCOUNTER — Telehealth: Payer: BC Managed Care – PPO | Admitting: Family Medicine

## 2021-02-17 ENCOUNTER — Other Ambulatory Visit: Payer: Self-pay

## 2021-02-17 DIAGNOSIS — Z7689 Persons encountering health services in other specified circumstances: Secondary | ICD-10-CM

## 2021-02-22 ENCOUNTER — Other Ambulatory Visit: Payer: Self-pay

## 2021-02-22 DIAGNOSIS — Z7689 Persons encountering health services in other specified circumstances: Secondary | ICD-10-CM

## 2021-03-02 ENCOUNTER — Encounter: Payer: BC Managed Care – PPO | Admitting: Obstetrics and Gynecology

## 2021-03-03 ENCOUNTER — Encounter: Payer: Self-pay | Admitting: Family Medicine

## 2021-03-03 ENCOUNTER — Telehealth (INDEPENDENT_AMBULATORY_CARE_PROVIDER_SITE_OTHER): Payer: BC Managed Care – PPO | Admitting: Family Medicine

## 2021-03-03 ENCOUNTER — Ambulatory Visit (INDEPENDENT_AMBULATORY_CARE_PROVIDER_SITE_OTHER): Payer: BC Managed Care – PPO | Admitting: Obstetrics and Gynecology

## 2021-03-03 ENCOUNTER — Other Ambulatory Visit (HOSPITAL_COMMUNITY)
Admission: RE | Admit: 2021-03-03 | Discharge: 2021-03-03 | Disposition: A | Discharge status: Home / Self Care | Payer: BC Managed Care – PPO | Source: Ambulatory Visit | Attending: Obstetrics and Gynecology | Admitting: Obstetrics and Gynecology

## 2021-03-03 ENCOUNTER — Encounter: Payer: Self-pay | Admitting: Obstetrics and Gynecology

## 2021-03-03 ENCOUNTER — Other Ambulatory Visit: Payer: Self-pay

## 2021-03-03 VITALS — BP 120/67 | HR 88 | Ht 64.0 in | Wt 256.8 lb

## 2021-03-03 DIAGNOSIS — Z01419 Encounter for gynecological examination (general) (routine) without abnormal findings: Secondary | ICD-10-CM | POA: Diagnosis not present

## 2021-03-03 DIAGNOSIS — Z6841 Body Mass Index (BMI) 40.0 and over, adult: Secondary | ICD-10-CM

## 2021-03-03 DIAGNOSIS — Z7689 Persons encountering health services in other specified circumstances: Secondary | ICD-10-CM

## 2021-03-03 MED ORDER — PHENTERMINE HCL 37.5 MG PO TABS: 37.5000 mg | ORAL_TABLET | Freq: Every day | ORAL | 0 refills | Status: DC

## 2021-03-03 NOTE — Assessment & Plan Note (Signed)
Doing well on phentermine without side effects. Also making appropriate lifestyle changes, down 8lbs since last visit. Refills provided. F/u in 2 months, at that time recommend BMP, vitals check.

## 2021-03-03 NOTE — Addendum Note (Signed)
Addended by: Marchelle Folks on: 03/03/2021 11:54 AM   Modules accepted: Orders

## 2021-03-03 NOTE — Progress Notes (Signed)
Virtual Visit via Video Note  I connected with Rhonda Davies on 03/03/21 at 10:00 AM EST by a video enabled telemedicine application and verified that I am speaking with the correct person using two identifiers.  Location: Patient: home Provider: [facility]   I discussed the limitations of evaluation and management by telemedicine and the availability of in person appointments. The patient expressed understanding and agreed to proceed.  History of Present Illness:  OBESITY - Meds: phentermine 37.5mg  once daily. No missed doses. Recently restarted in February. - Not previously on pharmacologic - Complications of obesity: elevated BP, no h/o HTN.  - FH of diabetes - Peak weight: 261lb - Weight loss goal: <200lb - Weight loss to date: 8lb - Diet: cut carbs and soda completely. Increased protein and vegetable intake. - Exercise: walking 3x per week. - will be getting in contact with nutritionist. - denies tachycardia, chest pain, GI upset. - 122/82 yesterday.    Observations/Objective:  Well appearing, in NAD  Assessment and Plan:  Morbid obesity with BMI of 40.0-44.9, adult (HCC) Doing well on phentermine without side effects. Also making appropriate lifestyle changes, down 8lbs since last visit. Refills provided. F/u in 2 months, at that time recommend BMP, vitals check.     I discussed the assessment and treatment plan with the patient. The patient was provided an opportunity to ask questions and all were answered. The patient agreed with the plan and demonstrated an understanding of the instructions.   The patient was advised to call back or seek an in-person evaluation if the symptoms worsen or if the condition fails to improve as anticipated.  I provided 6 minutes of non-face-to-face time during this encounter.   Caro Laroche, DO

## 2021-03-03 NOTE — Progress Notes (Signed)
HPI:      [name] is a 30 y.o. [id number] who LMP was Patient's last menstrual period was 02/04/2021 (exact date).  Subjective:   She presents today for her annual examination.  She has no complaints.  She has an IUD for birth control and she says it is going well. She gets her lab work through her PCP. She is due for a Pap today.    Hx: The following portions of the patient's history were reviewed and updated as appropriate:             She  has a past medical history of Anxiety, H/O chlamydia infection (01/27/2016), H/O trichomoniasis (12/14/2016), Kidney stone, Left groin pain, Personal history UTI, and Pregnancy induced hypertension. She does not have any pertinent problems on file. She  has a past surgical history that includes Tonsillectomy and adenoidectomy (2007) and CYST REMOVED (2013). Her family history includes Aneurysm in her paternal uncle; Anxiety disorder in her brother, father, and paternal aunt; Bipolar disorder in her paternal aunt; Diabetes in her paternal aunt and paternal grandmother; Heart disease in her father, maternal grandfather, and paternal grandfather. She  reports that she has never smoked. She has never used smokeless tobacco. She reports that she does not drink alcohol and does not use drugs. She has a current medication list which includes the following prescription(s): buspirone, escitalopram, levonorgestrel, and phentermine. She is allergic to benzonatate and covid-19 mrna vaccine (pfizer) [covid-19 mrna vacc (moderna)].       Review of Systems:  Review of Systems  Constitutional: Denied constitutional symptoms, night sweats, recent illness, fatigue, fever, insomnia and weight loss.  Eyes: Denied eye symptoms, eye pain, photophobia, vision change and visual disturbance.  Ears/Nose/Throat/Neck: Denied ear, nose, throat or neck symptoms, hearing loss, nasal discharge, sinus congestion and sore throat.  Cardiovascular: Denied cardiovascular  symptoms, arrhythmia, chest pain/pressure, edema, exercise intolerance, orthopnea and palpitations.  Respiratory: Denied pulmonary symptoms, asthma, pleuritic pain, productive sputum, cough, dyspnea and wheezing.  Gastrointestinal: Denied, gastro-esophageal reflux, melena, nausea and vomiting.  Genitourinary: Denied genitourinary symptoms including symptomatic vaginal discharge, pelvic relaxation issues, and urinary complaints.  Musculoskeletal: Denied musculoskeletal symptoms, stiffness, swelling, muscle weakness and myalgia.  Dermatologic: Denied dermatology symptoms, rash and scar.  Neurologic: Denied neurology symptoms, dizziness, headache, neck pain and syncope.  Psychiatric: Denied psychiatric symptoms, anxiety and depression.  Endocrine: Denied endocrine symptoms including hot flashes and night sweats.   Meds:   Current Outpatient Medications on File Prior to Visit  Medication Sig Dispense Refill  . busPIRone (BUSPAR) 5 MG tablet Take 1-2 tablets (5-10 mg total) by mouth 2 (two) times daily as needed (for anxiety/stress/agitation sx). 60 tablet 2  . escitalopram (LEXAPRO) 20 MG tablet Take 1 tablet (20 mg total) by mouth daily. 90 tablet 3  . levonorgestrel (MIRENA) 20 MCG/24HR IUD 1 each by Intrauterine route once.     No current facility-administered medications on file prior to visit.       The pregnancy intention screening data noted above was reviewed. Potential methods of contraception were discussed. The patient elected to proceed with IUD or IUS.     Objective:     Vitals:   03/03/21 1114  BP: 120/67  Pulse: 88    Filed Weights   03/03/21 1114  Weight: 256 lb 12.8 oz (116.5 kg)              Physical examination General NAD, Conversant  HEENT Atraumatic; Op clear with mmm.  Normo-cephalic. Pupils  reactive. Anicteric sclerae  Thyroid/Neck Smooth without nodularity or enlargement. Normal ROM.  Neck Supple.  Skin No rashes, lesions or ulceration. Normal palpated  skin turgor. No nodularity.  Breasts: No masses or discharge.  Symmetric.  No axillary adenopathy.  Lungs: Clear to auscultation.No rales or wheezes. Normal Respiratory effort, no retractions.  Heart: NSR.  No murmurs or rubs appreciated. No periferal edema  Abdomen: Soft.  Non-tender.  No masses.  No HSM. No hernia  Extremities: Moves all appropriately.  Normal ROM for age. No lymphadenopathy.  Neuro: Oriented to PPT.  Normal mood. Normal affect.     Pelvic:   Vulva: Normal appearance.  No lesions.  Vagina: No lesions or abnormalities noted.  Support: Normal pelvic support.  Urethra No masses tenderness or scarring.  Meatus Normal size without lesions or prolapse.  Cervix: Normal appearance.  No lesions.  Anus: Normal exam.  No lesions.  Perineum: Normal exam.  No lesions.        Bimanual   Uterus: Normal size.  Non-tender.  Mobile.  AV.  Adnexae: No masses.  Non-tender to palpation.  Cul-de-sac: Negative for abnormality.      Assessment:    [id number] Patient Active Problem List   Diagnosis Date Noted  . Situational stress 05/27/2020  . Blood pressure elevated without history of HTN 05/27/2020  . Post-dates pregnancy 02/18/2019  . Current mild episode of major depressive disorder without prior episode (HCC) 06/12/2018  . Anxiety disorder 04/03/2018  . Stress headaches 04/03/2018  . Family history of diabetes mellitus 03/20/2018  . H/O trichomoniasis 12/14/2016  . ASCUS with positive high risk HPV 05/29/2016  . Morbid obesity with BMI of 40.0-44.9, adult (HCC) 04/26/2016  . H/O chlamydia infection 01/27/2016     1. Well woman exam with routine gynecological exam   2. Morbid obesity with BMI of 40.0-44.9, adult (HCC)     Normal exam   Plan:            1.  Basic Screening Recommendations The basic screening recommendations for asymptomatic women were discussed with the patient during her visit.  The age-appropriate recommendations were discussed with her and the  rational for the tests reviewed.  When I am informed by the patient that another primary care physician has previously obtained the age-appropriate tests and they are up-to-date, only outstanding tests are ordered and referrals given as necessary.  Abnormal results of tests will be discussed with her when all of her results are completed.  Routine preventative health maintenance measures emphasized: Exercise/Diet/Weight control, Tobacco Warnings, Alcohol/Substance use risks and Stress Management Pap performed Orders No orders of the defined types were placed in this encounter.   No orders of the defined types were placed in this encounter.           F/U  No follow-ups on file.  [name], M.D. 03/03/2021 11:48 AM

## 2021-03-03 NOTE — Patient Instructions (Signed)
It was great to see you!  Our plans for today:  - No changes to your medications, we sent refills to the pharmacy. - Follow up in 2 months for an in-person appointment, at that time we will do labs and a vitals check.   Take care and seek immediate care sooner if you develop any concerns.   Dr. Linwood Dibbles

## 2021-03-07 LAB — CYTOLOGY - PAP
Comment: NEGATIVE
Diagnosis: UNDETERMINED — AB
High risk HPV: POSITIVE — AB

## 2021-03-08 NOTE — Progress Notes (Signed)
Here you go. Thanks, JML

## 2021-03-15 DIAGNOSIS — R030 Elevated blood-pressure reading, without diagnosis of hypertension: Secondary | ICD-10-CM | POA: Diagnosis not present

## 2021-03-15 DIAGNOSIS — F330 Major depressive disorder, recurrent, mild: Secondary | ICD-10-CM | POA: Diagnosis not present

## 2021-03-15 DIAGNOSIS — F411 Generalized anxiety disorder: Secondary | ICD-10-CM | POA: Diagnosis not present

## 2021-03-26 ENCOUNTER — Encounter: Payer: Self-pay | Admitting: Obstetrics and Gynecology

## 2021-03-26 ENCOUNTER — Ambulatory Visit (INDEPENDENT_AMBULATORY_CARE_PROVIDER_SITE_OTHER): Payer: BC Managed Care – PPO | Admitting: Obstetrics and Gynecology

## 2021-03-26 ENCOUNTER — Other Ambulatory Visit (HOSPITAL_COMMUNITY)
Admission: RE | Admit: 2021-03-26 | Discharge: 2021-03-26 | Disposition: A | Discharge status: Home / Self Care | Payer: BC Managed Care – PPO | Source: Ambulatory Visit | Attending: Obstetrics and Gynecology | Admitting: Obstetrics and Gynecology

## 2021-03-26 ENCOUNTER — Other Ambulatory Visit: Payer: Self-pay

## 2021-03-26 VITALS — BP 109/74 | HR 103 | Resp 16 | Ht 64.0 in | Wt 253.5 lb

## 2021-03-26 DIAGNOSIS — R87610 Atypical squamous cells of undetermined significance on cytologic smear of cervix (ASC-US): Secondary | ICD-10-CM | POA: Insufficient documentation

## 2021-03-26 DIAGNOSIS — B977 Papillomavirus as the cause of diseases classified elsewhere: Secondary | ICD-10-CM

## 2021-03-26 DIAGNOSIS — Z87410 Personal history of cervical dysplasia: Secondary | ICD-10-CM

## 2021-03-26 DIAGNOSIS — N72 Inflammatory disease of cervix uteri: Secondary | ICD-10-CM | POA: Insufficient documentation

## 2021-03-26 DIAGNOSIS — N870 Mild cervical dysplasia: Secondary | ICD-10-CM | POA: Diagnosis not present

## 2021-03-26 DIAGNOSIS — N888 Other specified noninflammatory disorders of cervix uteri: Secondary | ICD-10-CM | POA: Diagnosis not present

## 2021-03-26 HISTORY — DX: Personal history of cervical dysplasia: Z87.410

## 2021-03-26 NOTE — Progress Notes (Signed)
HPI:  Rhonda Davies is a 30 y.o.  [phone]  who presents today for evaluation and management of abnormal cervical cytology.    Dysplasia History:   ASCUS -positive HPV    Previous Pap in 2017 also showed ASCUS positive HPV.  Then following a pregnancy patient had a Pap smear in 2019 which revealed no epithelial abnormality.  ROS:  Pertinent items noted in HPI and remainder of comprehensive ROS otherwise negative.  OB History  Gravida Para Term Preterm AB Living  2 2 2  0 0 2  SAB IAB Ectopic Multiple Live Births  0 0 0 0 2    # Outcome Date GA Lbr Len/2nd Weight Sex Delivery Anes PTL Lv  2 Term 02/18/19 [redacted]w[redacted]d / 00:13 9 lb 2 oz (4.14 kg) F Vag-Spont EPI  LIV  1 Term 10/30/16 [redacted]w[redacted]d 00:55 4 lb 14.7 oz (2.23 kg) F Vag-Spont EPI  LIV    Obstetric Comments  1. Growth restriction, PIH in pregnancy with severe-pre-eclampsia developing in labor.    Past Medical History:  Diagnosis Date  . Anxiety   . H/O chlamydia infection 01/27/2016   Chlamydia infection 12/2015, treated.    . H/O trichomoniasis 12/14/2016  . Kidney stone   . Left groin pain   . Personal history UTI   . Pregnancy induced hypertension     Past Surgical History:  Procedure Laterality Date  . CYST REMOVED  2013   IN NECK  . TONSILLECTOMY AND ADENOIDECTOMY  2007    SOCIAL HISTORY: Social History   Substance and Sexual Activity  Alcohol Use No  . Alcohol/week: 1.0 standard drink  . Types: 1 Standard drinks or equivalent per week   Social History   Substance and Sexual Activity  Drug Use No     Family History  Problem Relation Age of Onset  . Heart disease Father   . Anxiety disorder Father   . Heart disease Maternal Grandfather   . Diabetes Paternal Grandmother   . Heart disease Paternal Grandfather   . Anxiety disorder Paternal Aunt   . Bipolar disorder Paternal Aunt   . Diabetes Paternal Aunt   . Anxiety disorder Brother   . Aneurysm Paternal Uncle   . Breast cancer Neg Hx   . Colon  cancer Neg Hx   . Ovarian cancer Neg Hx     ALLERGIES:  Benzonatate and Covid-19 mrna vaccine (pfizer) [covid-19 mrna vacc (moderna)]  She has a current medication list which includes the following prescription(s): buspirone, escitalopram, levonorgestrel, and phentermine.  Physical Exam: -Vitals:  BP 109/74   Pulse (!) 103   Resp 16   Ht 5\' 4"  (1.626 m)   Wt 253 lb 8 oz (115 kg)   BMI 43.51 kg/m   PROCEDURE: Colposcopy performed with 4% acetic acid after verbal consent obtained                           -Aceto-white Lesions Location(s): 11 and 2 o'clock.              -Biopsy performed at 11 and 2 o'clock               -ECC indicated and performed: No.     -Biopsy sites made hemostatic with pressure and Monsel's solution   -Satisfactory colposcopy: Yes.      -Evidence of Invasive cervical CA :  NO  ASSESSMENT:  Rhonda Davies is a 30 y.o. Rhonda Davies here for  1. Atypical squamous cells of undetermined significance on cytologic smear of cervix (ASC-US)   2. High risk human papilloma virus (HPV) infection of cervix   .  PLAN: 1.  I discussed the grading system of pap smears and HPV high risk viral types.  We will discuss management after colpo results return.  No orders of the defined types were placed in this encounter.  I spent 15 minutes additional time discussing natural course and history of HPV, epithelial abnormalities and cervical viral changes.  This involved in the care of this patient preparing to see the patient by obtaining and reviewing her medical history (including labs, imaging tests and prior procedures), documenting clinical information in the electronic health record (EHR), counseling and coordinating care plans, writing and sending prescriptions, ordering tests or procedures and directly communicating with the patient by discussing pertinent items from her history and physical exam as well as detailing my assessment and plan as noted above so that she has an  informed understanding.  All of her questions were answered.         F/U  Return in about 2 weeks (around 04/09/2021) for Colpo f/u.  Brennan Bailey ,MD 03/26/2021,8:42 AM

## 2021-03-26 NOTE — Addendum Note (Signed)
Addended by: Tommie Raymond on: 03/26/2021 12:10 PM   Modules accepted: Orders

## 2021-03-26 NOTE — Addendum Note (Signed)
Addended by: Silvano Bilis on: 03/26/2021 12:01 PM   Modules accepted: Orders

## 2021-04-01 LAB — SURGICAL PATHOLOGY

## 2021-04-12 ENCOUNTER — Encounter: Payer: Self-pay | Admitting: Obstetrics and Gynecology

## 2021-04-12 ENCOUNTER — Other Ambulatory Visit: Payer: Self-pay

## 2021-04-12 ENCOUNTER — Ambulatory Visit (INDEPENDENT_AMBULATORY_CARE_PROVIDER_SITE_OTHER): Payer: BC Managed Care – PPO | Admitting: Obstetrics and Gynecology

## 2021-04-12 VITALS — BP 136/85 | HR 93 | Ht 64.0 in | Wt 257.7 lb

## 2021-04-12 DIAGNOSIS — N72 Inflammatory disease of cervix uteri: Secondary | ICD-10-CM

## 2021-04-12 DIAGNOSIS — B977 Papillomavirus as the cause of diseases classified elsewhere: Secondary | ICD-10-CM

## 2021-04-12 DIAGNOSIS — N870 Mild cervical dysplasia: Secondary | ICD-10-CM

## 2021-04-12 NOTE — Progress Notes (Signed)
HPI:      [name] is a 30 y.o. [id number] who LMP was Patient's last menstrual period was 04/03/2021.  Subjective:   She presents today for follow-up of colposcopy. Colposcopy showed CIN-1. Patient states she has been vaccinated with Gardasil in the past.    Hx: The following portions of the patient's history were reviewed and updated as appropriate:             She  has a past medical history of Anxiety, H/O chlamydia infection (01/27/2016), H/O trichomoniasis (12/14/2016), Kidney stone, Left groin pain, Personal history UTI, and Pregnancy induced hypertension. She does not have any pertinent problems on file. She  has a past surgical history that includes Tonsillectomy and adenoidectomy (2007) and CYST REMOVED (2013). Her family history includes Aneurysm in her paternal uncle; Anxiety disorder in her brother, father, and paternal aunt; Bipolar disorder in her paternal aunt; Diabetes in her paternal aunt and paternal grandmother; Heart disease in her father, maternal grandfather, and paternal grandfather. She  reports that she has never smoked. She has never used smokeless tobacco. She reports that she does not drink alcohol and does not use drugs. She has a current medication list which includes the following prescription(s): buspirone, escitalopram, levonorgestrel, and phentermine. She is allergic to benzonatate and covid-19 mrna vaccine (pfizer) [covid-19 mrna vacc (moderna)].       Review of Systems:  Review of Systems  Constitutional: Denied constitutional symptoms, night sweats, recent illness, fatigue, fever, insomnia and weight loss.  Eyes: Denied eye symptoms, eye pain, photophobia, vision change and visual disturbance.  Ears/Nose/Throat/Neck: Denied ear, nose, throat or neck symptoms, hearing loss, nasal discharge, sinus congestion and sore throat.  Cardiovascular: Denied cardiovascular symptoms, arrhythmia, chest pain/pressure, edema, exercise intolerance, orthopnea  and palpitations.  Respiratory: Denied pulmonary symptoms, asthma, pleuritic pain, productive sputum, cough, dyspnea and wheezing.  Gastrointestinal: Denied, gastro-esophageal reflux, melena, nausea and vomiting.  Genitourinary: Denied genitourinary symptoms including symptomatic vaginal discharge, pelvic relaxation issues, and urinary complaints.  Musculoskeletal: Denied musculoskeletal symptoms, stiffness, swelling, muscle weakness and myalgia.  Dermatologic: Denied dermatology symptoms, rash and scar.  Neurologic: Denied neurology symptoms, dizziness, headache, neck pain and syncope.  Psychiatric: Denied psychiatric symptoms, anxiety and depression.  Endocrine: Denied endocrine symptoms including hot flashes and night sweats.   Meds:   Current Outpatient Medications on File Prior to Visit  Medication Sig Dispense Refill  . busPIRone (BUSPAR) 5 MG tablet Take 1-2 tablets (5-10 mg total) by mouth 2 (two) times daily as needed (for anxiety/stress/agitation sx). 60 tablet 2  . escitalopram (LEXAPRO) 20 MG tablet Take 1 tablet (20 mg total) by mouth daily. 90 tablet 3  . levonorgestrel (MIRENA) 20 MCG/24HR IUD 1 each by Intrauterine route once.    . phentermine (ADIPEX-P) 37.5 MG tablet Take 1 tablet (37.5 mg total) by mouth daily before breakfast. 60 tablet 0   No current facility-administered medications on file prior to visit.          Objective:     Vitals:   04/12/21 0908  BP: 136/85  Pulse: 93   Filed Weights   04/12/21 0908  Weight: 257 lb 11.2 oz (116.9 kg)              CIN-1 by colposcopically directed biopsies  Assessment:    [id number] Patient Active Problem List   Diagnosis Date Noted  . Situational stress 05/27/2020  . Blood pressure elevated without history of HTN 05/27/2020  . Post-dates pregnancy 02/18/2019  . Current  mild episode of major depressive disorder without prior episode (HCC) 06/12/2018  . Anxiety disorder 04/03/2018  . Stress headaches  04/03/2018  . Family history of diabetes mellitus 03/20/2018  . H/O trichomoniasis 12/14/2016  . ASCUS with positive high risk HPV 05/29/2016  . Morbid obesity with BMI of 40.0-44.9, adult (HCC) 04/26/2016  . H/O chlamydia infection 01/27/2016     1. CIN I (cervical intraepithelial neoplasia I)   2. High risk human papilloma virus (HPV) infection of cervix        Plan:            1.  Natural course and history of HPV discussed in detail.  The relationship of HPV and cervical dysplasia discussed.  Will plan follow-up Pap cotesting in 1 year at her annual examination.  All questions answered. Orders No orders of the defined types were placed in this encounter.   No orders of the defined types were placed in this encounter.     F/U  Return for Annual Physical. I spent 21 minutes involved in the care of this patient preparing to see the patient by obtaining and reviewing her medical history (including labs, imaging tests and prior procedures), documenting clinical information in the electronic health record (EHR), counseling and coordinating care plans, writing and sending prescriptions, ordering tests or procedures and directly communicating with the patient by discussing pertinent items from her history and physical exam as well as detailing my assessment and plan as noted above so that she has an informed understanding.  All of her questions were answered.  [name], M.D. 04/12/2021 9:29 AM

## 2021-05-17 DIAGNOSIS — F411 Generalized anxiety disorder: Secondary | ICD-10-CM | POA: Diagnosis not present

## 2021-05-17 DIAGNOSIS — R030 Elevated blood-pressure reading, without diagnosis of hypertension: Secondary | ICD-10-CM | POA: Diagnosis not present

## 2021-05-17 DIAGNOSIS — F330 Major depressive disorder, recurrent, mild: Secondary | ICD-10-CM | POA: Diagnosis not present

## 2021-05-18 ENCOUNTER — Other Ambulatory Visit: Payer: Self-pay | Admitting: Family Medicine

## 2021-05-18 DIAGNOSIS — F419 Anxiety disorder, unspecified: Secondary | ICD-10-CM

## 2021-05-18 DIAGNOSIS — F439 Reaction to severe stress, unspecified: Secondary | ICD-10-CM

## 2021-06-29 DIAGNOSIS — F330 Major depressive disorder, recurrent, mild: Secondary | ICD-10-CM | POA: Diagnosis not present

## 2021-06-29 DIAGNOSIS — R030 Elevated blood-pressure reading, without diagnosis of hypertension: Secondary | ICD-10-CM | POA: Diagnosis not present

## 2021-06-29 DIAGNOSIS — F411 Generalized anxiety disorder: Secondary | ICD-10-CM | POA: Diagnosis not present

## 2021-07-05 DIAGNOSIS — Z6841 Body Mass Index (BMI) 40.0 and over, adult: Secondary | ICD-10-CM | POA: Diagnosis not present

## 2021-09-27 ENCOUNTER — Telehealth: Payer: Self-pay | Admitting: Obstetrics and Gynecology

## 2021-09-27 NOTE — Telephone Encounter (Signed)
Rhonda Davies called in and states that she is having sharp pains in her abdominal area when she coughs or sneezes.  Patient states she had a cyst at one point and her mother had endometriosis and knows it is hereditary and wants to make sure everything is ok.  Patient made an appointment for October 17th at 10:15 but wanted me to send a message to Dr. Logan Bores to see if she needed to come in sooner.  Please advise.

## 2021-09-29 NOTE — Telephone Encounter (Signed)
Called patient she will keep scheduled appointment. She will call and get another appointment if things worsen before then.

## 2021-10-11 ENCOUNTER — Encounter: Payer: Self-pay | Admitting: Obstetrics and Gynecology

## 2021-10-11 ENCOUNTER — Ambulatory Visit: Payer: Managed Care, Other (non HMO) | Admitting: Obstetrics and Gynecology

## 2021-10-11 ENCOUNTER — Other Ambulatory Visit: Payer: Self-pay

## 2021-10-11 VITALS — BP 121/81 | HR 96 | Resp 16 | Ht 63.0 in | Wt 259.6 lb

## 2021-10-11 DIAGNOSIS — R102 Pelvic and perineal pain: Secondary | ICD-10-CM

## 2021-10-11 DIAGNOSIS — N921 Excessive and frequent menstruation with irregular cycle: Secondary | ICD-10-CM

## 2021-10-11 DIAGNOSIS — Z975 Presence of (intrauterine) contraceptive device: Secondary | ICD-10-CM

## 2021-10-11 NOTE — Progress Notes (Signed)
HPI:      [name] is a 30 y.o. [id number] who LMP was Patient's last menstrual period was 09/24/2021 (exact date).  Subjective:   She presents today stating that the pain that she has had for 2 years is getting worse.  It is especially bad with coughing and sneezing.  She finds it worse after a day of exertion.  It is located in the left lower quadrant sometimes radiating to the back. Additionally, patient is now experiencing increased bleeding each month.  Bleeds between 1 and 2 weeks/month despite Mirena IUD.  IUD was checked 1 year ago by ultrasound and found to be correctly positioned.  Patient states that the bleeding is now getting to be just too much.    Hx: The following portions of the patient's history were reviewed and updated as appropriate:             She  has a past medical history of Anxiety, H/O chlamydia infection (01/27/2016), H/O trichomoniasis (12/14/2016), Kidney stone, Left groin pain, Personal history UTI, and Pregnancy induced hypertension. She does not have any pertinent problems on file. She  has a past surgical history that includes Tonsillectomy and adenoidectomy (2007) and CYST REMOVED (2013). Her family history includes Aneurysm in her paternal uncle; Anxiety disorder in her brother, father, and paternal aunt; Bipolar disorder in her paternal aunt; Diabetes in her paternal aunt and paternal grandmother; Heart disease in her father, maternal grandfather, and paternal grandfather. She  reports that she has never smoked. She has never used smokeless tobacco. She reports that she does not drink alcohol and does not use drugs. She has a current medication list which includes the following prescription(s): bupropion, buspirone, escitalopram, levonorgestrel, and topiramate. She is allergic to benzonatate and covid-19 mrna vaccine (pfizer) [covid-19 mrna vacc (moderna)].       Review of Systems:  Review of Systems  Constitutional: Denied constitutional  symptoms, night sweats, recent illness, fatigue, fever, insomnia and weight loss.  Eyes: Denied eye symptoms, eye pain, photophobia, vision change and visual disturbance.  Ears/Nose/Throat/Neck: Denied ear, nose, throat or neck symptoms, hearing loss, nasal discharge, sinus congestion and sore throat.  Cardiovascular: Denied cardiovascular symptoms, arrhythmia, chest pain/pressure, edema, exercise intolerance, orthopnea and palpitations.  Respiratory: Denied pulmonary symptoms, asthma, pleuritic pain, productive sputum, cough, dyspnea and wheezing.  Gastrointestinal: Denied, gastro-esophageal reflux, melena, nausea and vomiting.  Genitourinary: See HPI for additional information.  Musculoskeletal: Denied musculoskeletal symptoms, stiffness, swelling, muscle weakness and myalgia.  Dermatologic: Denied dermatology symptoms, rash and scar.  Neurologic: Denied neurology symptoms, dizziness, headache, neck pain and syncope.  Psychiatric: Denied psychiatric symptoms, anxiety and depression.  Endocrine: Denied endocrine symptoms including hot flashes and night sweats.   Meds:   Current Outpatient Medications on File Prior to Visit  Medication Sig Dispense Refill   buPROPion (WELLBUTRIN XL) 300 MG 24 hr tablet Take by mouth.     busPIRone (BUSPAR) 5 MG tablet Take 1-2 tablets (5-10 mg total) by mouth 2 (two) times daily as needed (for anxiety/stress/agitation sx). 60 tablet 2   escitalopram (LEXAPRO) 20 MG tablet Take 1 tablet (20 mg total) by mouth daily. 90 tablet 3   levonorgestrel (MIRENA) 20 MCG/24HR IUD 1 each by Intrauterine route once.     topiramate (TOPAMAX) 100 MG tablet Take by mouth.     No current facility-administered medications on file prior to visit.      Objective:     Vitals:   10/11/21 1024  BP: 121/81  Pulse: 96  Resp: 16   Filed Weights   10/11/21 1024  Weight: 259 lb 9.6 oz (117.8 kg)              Abdominal examination reveals localized pain to the left lower  quadrant/inguinal area.  Pain is reproducible by palpation of the abdominal wall.  Pain is worse with Valsalva maneuver.          Assessment:    G2P2002 Patient Active Problem List   Diagnosis Date Noted   Situational stress 05/27/2020   Blood pressure elevated without history of HTN 05/27/2020   Post-dates pregnancy 02/18/2019   Current mild episode of major depressive disorder without prior episode (HCC) 06/12/2018   Anxiety disorder 04/03/2018   Stress headaches 04/03/2018   Family history of diabetes mellitus 03/20/2018   H/O trichomoniasis 12/14/2016   ASCUS with positive high risk HPV 05/29/2016   Morbid obesity with BMI of 40.0-44.9, adult (HCC) 04/26/2016   H/O chlamydia infection 01/27/2016     1. Pelvic pain in female   2. Breakthrough bleeding associated with intrauterine device (IUD)     Patient likely has a small inguinal hernia.  This goes along with her examination and symptoms.  She also has breakthrough bleeding with her IUD.  The IUD has been replaced 1 time for the same issue and it is no better.  She has had an ultrasound showing the IUD correctly positioned.   Plan:            1.  Pelvic ultrasound for IUD position and rule out ovarian cyst.  2.  Patient to decide if she would like further work-up for possible inguinal hernia or surgical referral.  3.  If IUD correctly positioned would consider 2.5 progesterone daily to try to alleviate monthly bleeding. Orders Orders Placed This Encounter  Procedures   US PELVIS (TRANSABDOMINAL ONLY)   US PELVIS TRANSVAGINAL NON-OB (TV ONLY)    No orders of the defined types were placed in this encounter.     F/U  Return for We will contact her with any abnormal test results, Pt to contact us if symptoms worsen. I spent 22 minutes involved in the care of this patient preparing to see the patient by obtaining and reviewing her medical history (including labs, imaging tests and prior procedures), documenting clinical  information in the electronic health record (EHR), counseling and coordinating care plans, writing and sending prescriptions, ordering tests or procedures and in direct communicating with the patient and medical staff discussing pertinent items from her history and physical exam.  [name], M.D. 10/11/2021 10:43 AM

## 2021-10-14 ENCOUNTER — Other Ambulatory Visit: Payer: Self-pay | Admitting: General Surgery

## 2021-10-14 DIAGNOSIS — R1032 Left lower quadrant pain: Secondary | ICD-10-CM

## 2021-10-26 ENCOUNTER — Other Ambulatory Visit: Payer: Managed Care, Other (non HMO)

## 2021-10-26 ENCOUNTER — Other Ambulatory Visit: Payer: Self-pay

## 2021-10-26 ENCOUNTER — Ambulatory Visit: Payer: Managed Care, Other (non HMO) | Admitting: Obstetrics and Gynecology

## 2021-10-26 ENCOUNTER — Encounter: Payer: Self-pay | Admitting: Obstetrics and Gynecology

## 2021-10-26 VITALS — BP 122/78 | HR 88 | Resp 16 | Ht 63.0 in | Wt 262.6 lb

## 2021-10-26 DIAGNOSIS — N921 Excessive and frequent menstruation with irregular cycle: Secondary | ICD-10-CM

## 2021-10-26 DIAGNOSIS — Z30432 Encounter for removal of intrauterine contraceptive device: Secondary | ICD-10-CM

## 2021-10-26 DIAGNOSIS — R102 Pelvic and perineal pain: Secondary | ICD-10-CM

## 2021-10-26 DIAGNOSIS — Z975 Presence of (intrauterine) contraceptive device: Secondary | ICD-10-CM

## 2021-10-26 NOTE — Progress Notes (Signed)
HPI:      [name] is a 30 y.o. [id number] who LMP was Patient's last menstrual period was 10/11/2021 (exact date).  Subjective:   She presents today for IUD removal.  She has been experiencing breakthrough bleeding with her IUD and generalized pelvic pain and discomfort.  An ultrasound 1 year ago revealed the IUD to be correctly positioned.  She has previously tried removal and replacement.  She desires removal today. She plans to use condoms for birth control.    Hx: The following portions of the patient's history were reviewed and updated as appropriate:             She  has a past medical history of Anxiety, H/O chlamydia infection (01/27/2016), H/O trichomoniasis (12/14/2016), Kidney stone, Left groin pain, Personal history UTI, and Pregnancy induced hypertension. She does not have any pertinent problems on file. She  has a past surgical history that includes Tonsillectomy and adenoidectomy (2007) and CYST REMOVED (2013). Her family history includes Aneurysm in her paternal uncle; Anxiety disorder in her brother, father, and paternal aunt; Bipolar disorder in her paternal aunt; Diabetes in her paternal aunt and paternal grandmother; Heart disease in her father, maternal grandfather, and paternal grandfather. She  reports that she has never smoked. She has never used smokeless tobacco. She reports that she does not drink alcohol and does not use drugs. She has a current medication list which includes the following prescription(s): bupropion, buspirone, escitalopram, levonorgestrel, and topiramate. She is allergic to benzonatate and covid-19 mrna vaccine (pfizer) [covid-19 mrna vacc (moderna)].       Review of Systems:  Review of Systems  Constitutional: Denied constitutional symptoms, night sweats, recent illness, fatigue, fever, insomnia and weight loss.  Eyes: Denied eye symptoms, eye pain, photophobia, vision change and visual disturbance.  Ears/Nose/Throat/Neck: Denied ear,  nose, throat or neck symptoms, hearing loss, nasal discharge, sinus congestion and sore throat.  Cardiovascular: Denied cardiovascular symptoms, arrhythmia, chest pain/pressure, edema, exercise intolerance, orthopnea and palpitations.  Respiratory: Denied pulmonary symptoms, asthma, pleuritic pain, productive sputum, cough, dyspnea and wheezing.  Gastrointestinal: Denied, gastro-esophageal reflux, melena, nausea and vomiting.  Genitourinary: See HPI for additional information.  Musculoskeletal: Denied musculoskeletal symptoms, stiffness, swelling, muscle weakness and myalgia.  Dermatologic: Denied dermatology symptoms, rash and scar.  Neurologic: Denied neurology symptoms, dizziness, headache, neck pain and syncope.  Psychiatric: Denied psychiatric symptoms, anxiety and depression.  Endocrine: Denied endocrine symptoms including hot flashes and night sweats.   Meds:   Current Outpatient Medications on File Prior to Visit  Medication Sig Dispense Refill   buPROPion (WELLBUTRIN XL) 300 MG 24 hr tablet Take by mouth.     busPIRone (BUSPAR) 5 MG tablet Take 1-2 tablets (5-10 mg total) by mouth 2 (two) times daily as needed (for anxiety/stress/agitation sx). 60 tablet 2   escitalopram (LEXAPRO) 20 MG tablet Take 1 tablet (20 mg total) by mouth daily. 90 tablet 3   levonorgestrel (MIRENA) 20 MCG/24HR IUD 1 each by Intrauterine route once.     topiramate (TOPAMAX) 100 MG tablet Take by mouth.     No current facility-administered medications on file prior to visit.      Objective:     Vitals:   10/26/21 0856  BP: 122/78  Pulse: 88  Resp: 16   Filed Weights   10/26/21 0856  Weight: 262 lb 9.6 oz (119.1 kg)              IUD Removal Strings of IUD identified and grasped.  IUD removed without problem.  Pt tolerated this well.  IUD noted to be intact.            Assessment:    G2P2002 Patient Active Problem List   Diagnosis Date Noted   Situational stress 05/27/2020   Blood  pressure elevated without history of HTN 05/27/2020   Post-dates pregnancy 02/18/2019   Current mild episode of major depressive disorder without prior episode (HCC) 06/12/2018   Anxiety disorder 04/03/2018   Stress headaches 04/03/2018   Family history of diabetes mellitus 03/20/2018   H/O trichomoniasis 12/14/2016   ASCUS with positive high risk HPV 05/29/2016   Morbid obesity with BMI of 40.0-44.9, adult (HCC) 04/26/2016   H/O chlamydia infection 01/27/2016     1. Encounter for IUD removal   2. Pelvic pain in female   3. Breakthrough bleeding associated with intrauterine device (IUD)        Plan:            1.  IUD removed.  Patient plans to use condoms.  She would like to employ expectant management and see what her bleeding and pelvic pain is like.  2.  She has seen general surgery for possible hernia.  She has a CT scan ordered. Orders No orders of the defined types were placed in this encounter.   No orders of the defined types were placed in this encounter.     F/U  Return for Annual Physical.  Rhonda Davies, M.D. 10/26/2021 9:14 AM

## 2021-10-28 ENCOUNTER — Ambulatory Visit: Payer: Managed Care, Other (non HMO) | Admitting: Surgery

## 2021-11-04 ENCOUNTER — Ambulatory Visit: Payer: Managed Care, Other (non HMO) | Admitting: Surgery

## 2021-11-04 ENCOUNTER — Encounter: Payer: Self-pay | Admitting: Surgery

## 2021-11-04 ENCOUNTER — Other Ambulatory Visit: Payer: Self-pay

## 2021-11-04 VITALS — BP 133/83 | HR 108 | Temp 99.3°F | Ht 63.0 in | Wt 264.2 lb

## 2021-11-04 DIAGNOSIS — R1032 Left lower quadrant pain: Secondary | ICD-10-CM | POA: Diagnosis not present

## 2021-11-04 NOTE — Progress Notes (Signed)
Patient ID: Rhonda Davies, female   DOB: 08-Mar-1991, 30 y.o.   MRN: [id number]  Chief Complaint: Left groin pain for 2 years  History of Present Illness Rhonda Davies is a 30 y.o. female with left groin pain over the last 2 years, getting progressively worse over the last 4 months.  The pain will stop her in her tracks, usually occurring when she may be just walking.  It will bring her to her knees and resolve spontaneously.  It is not provoked by lifting, sneezing, coughing etc.  She has no known bulge or swelling.  The pain is severe and associated with nausea, but she denies vomiting fevers or chills.  She reports she is voiding normally.  The onset is again erratic unpredictable.  Past Medical History Past Medical History:  Diagnosis Date   Anxiety    H/O chlamydia infection 01/27/2016   Chlamydia infection 12/2015, treated.     H/O trichomoniasis 12/14/2016   Kidney stone    Left groin pain    Personal history UTI    Pregnancy induced hypertension       Past Surgical History:  Procedure Laterality Date   CYST REMOVED  2013   IN NECK   TONSILLECTOMY AND ADENOIDECTOMY  2007    Allergies  Allergen Reactions   Benzonatate Swelling   Covid-19 Mrna Vaccine (Pfizer) [Covid-19 Mrna Vacc (Moderna)]     SWELLING LEFT ARM    Current Outpatient Medications  Medication Sig Dispense Refill   buPROPion (WELLBUTRIN XL) 300 MG 24 hr tablet Take by mouth.     busPIRone (BUSPAR) 5 MG tablet Take 1-2 tablets (5-10 mg total) by mouth 2 (two) times daily as needed (for anxiety/stress/agitation sx). 60 tablet 2   escitalopram (LEXAPRO) 20 MG tablet Take 1 tablet (20 mg total) by mouth daily. 90 tablet 3   topiramate (TOPAMAX) 100 MG tablet Take by mouth.     No current facility-administered medications for this visit.    Family History Family History  Problem Relation Age of Onset   Heart disease Father    Anxiety disorder Father    Heart disease Maternal Grandfather     Diabetes Paternal Grandmother    Heart disease Paternal Grandfather    Anxiety disorder Paternal Aunt    Bipolar disorder Paternal Aunt    Diabetes Paternal Aunt    Anxiety disorder Brother    Aneurysm Paternal Uncle    Breast cancer Neg Hx    Colon cancer Neg Hx    Ovarian cancer Neg Hx       Social History Social History   Tobacco Use   Smoking status: Never   Smokeless tobacco: Never  Vaping Use   Vaping Use: Never used  Substance Use Topics   Alcohol use: No    Alcohol/week: 1.0 standard drink    Types: 1 Standard drinks or equivalent per week   Drug use: No        Review of Systems  Constitutional: Negative.   HENT: Negative.    Eyes: Negative.   Respiratory: Negative.    Cardiovascular: Negative.   Gastrointestinal:  Positive for abdominal pain.  Genitourinary: Negative.   Skin: Negative.   Neurological:  Positive for headaches.  Psychiatric/Behavioral:  Positive for depression.      Physical Exam Blood pressure 133/83, pulse (!) 108, temperature 99.3 F (37.4 C), temperature source Oral, height 5\' 3"  (1.6 m), weight 264 lb 3.2 oz (119.8 kg), last menstrual period 10/11/2021, SpO2 98 %. Last  Weight  Most recent update: 11/04/2021 11:33 AM    Weight  119.8 kg (264 lb 3.2 oz)             CONSTITUTIONAL: Well developed, and nourished, appropriately responsive and aware without distress.  Morbidly obese. EYES: Sclera non-icteric.   EARS, NOSE, MOUTH AND THROAT: Mask worn.   Hearing is intact to voice.  NECK: Trachea is midline, and there is no jugular venous distension.  LYMPH NODES:  Lymph nodes in the neck are not enlarged. RESPIRATORY:  Lungs are clear, and breath sounds are equal bilaterally. Normal respiratory effort without pathologic use of accessory muscles. CARDIOVASCULAR: Heart is regular in rate and rhythm. GI: The abdomen is well rounded soft, nontender, and nondistended. There were no palpable masses. I did not appreciate  hepatosplenomegaly. There were normal bowel sounds. GU: There is localized tenderness to the left inguinal ligament adjacent to the pubic area, Marylene Land is present as chaperone.  No appreciable bulge or change with Valsalva. MUSCULOSKELETAL:  Symmetrical muscle tone appreciated in all four extremities.    SKIN: Skin turgor is normal. No pathologic skin lesions appreciated.  NEUROLOGIC:  Motor and sensation appear grossly normal.  Cranial nerves are grossly without defect. PSYCH:  Alert and oriented to person, place and time. Affect is appropriate for situation.  Data Reviewed I have personally reviewed what is currently available of the patient's imaging, recent labs and medical records.   Labs:  CBC Latest Ref Rng & Units 07/03/2020 02/18/2019 12/08/2018  WBC 3.4 - 10.8 x10E3/uL 7.2 8.5 7.7  Hemoglobin 11.1 - 15.9 g/dL 77.4 10.8(L) 9.9(L)  Hematocrit 34.0 - 46.6 % 37.7 33.6(L) 30.3(L)  Platelets 150 - 450 x10E3/uL 316 239 224   CMP Latest Ref Rng & Units 07/03/2020 04/03/2018 01/25/2018  Glucose 65 - 99 mg/dL 98 128(N) 83  BUN 6 - 20 mg/dL 12 9 13   Creatinine 0.57 - 1.00 mg/dL 8.67 6.72  Sodium 134 - 144 mmol/L 139 141 142  Potassium 3.5 - 5.2 mmol/L 4.7 4.4 4.4  Chloride 96 - 106 mmol/L 104 107(H) 102  CO2 20 - 29 mmol/L 23 22 25   Calcium 8.7 - 10.2 mg/dL 0.94) ) 9.8  Total Protein 6.0 - 8.5 g/dL 6.3 6.7 6.9  Total Bilirubin 0.0 - 1.2 mg/dL 0.6 0.8 0.6  Alkaline Phos 48 - 121 IU/L 76 72 78  AST 0 - 40 IU/L 12 14 14   ALT 0 - 32 IU/L 15 13 15       Imaging:  Within last 24 hrs: No results found.  Assessment    Left groin pain, inconsistent with clinical history of inguinal hernia, potentially femoral hernia.  Exam challenging. Patient Active Problem List   Diagnosis Date Noted   Situational stress 05/27/2020   Blood pressure elevated without history of HTN 05/27/2020   Post-dates pregnancy 02/18/2019   Current mild episode of major depressive disorder without prior  episode (HCC) 06/12/2018   Anxiety disorder 04/03/2018   Stress headaches 04/03/2018   Family history of diabetes mellitus 03/20/2018   H/O trichomoniasis 12/14/2016   ASCUS with positive high risk HPV 05/29/2016   Morbid obesity with BMI of 40.0-44.9, adult (HCC) 04/26/2016   H/O chlamydia infection 01/27/2016    Plan    Appreciate that a CT scan was ordered for November 16.  We will await results of this and determining further recommendation.  Face-to-face time spent with the patient and accompanying care providers(if present) was 30 minutes, with more than 50% of  the time spent counseling, educating, and coordinating care of the patient.    These notes generated with voice recognition software. I apologize for typographical errors.  Ronny Bacon M.D., FACS 11/04/2021, 11:58 AM

## 2021-11-04 NOTE — Patient Instructions (Addendum)
If you have any concerns or questions, please feel free to call our office.   Femoral Hernia, Adult  Having a femoral hernia means that fat tissue or part of the intestine has pushed through a weak area of the muscles and into the femoral canal. The femoral canal is an opening in the lower groin. A femoral hernia may be present at birth, but it may not cause symptoms until you are an adult. You may also develop a femoral hernia as you get older. A femoral hernia tends to get worse over time. It will not go away if it is not treated. There are several types of femoral hernias. You may have: Reducible hernia. This is a hernia that comes and goes. You may be able to see it only when you strain, such as when you cough or lift something heavy. This type of hernia can be reduced by pushing it back into the abdomen. Incarcerated hernia. This type of hernia cannot be reduced, or pushed backed into the abdomen. Strangulated hernia. This is a hernia that cuts off blood flow to the tissues inside the hernia. Without blood supply, these tissues can start to die. This type of hernia requires emergency treatment. What are the causes? The cause is usually not known. This condition can be triggered by: Coughing. Suddenly straining the muscles of the abdomen. Lifting heavy objects. Straining to have a bowel movement. Constipation can lead to a femoral hernia. What increases the risk? You are more likely to develop this condition if: You are female. You smoke, cough a lot, or have lung disease. You are often constipated. You strain to pass urine. You are overweight. You are older than 60 years. What are the signs or symptoms? In many cases, a femoral hernia does not cause symptoms. The most common symptom is a bulge in the upper thigh or groin. In women, the bulge may form on the outside of the vagina instead. Other symptoms may include: Mild pain or pressure. Numbness. Symptoms of a strangulated femoral  hernia include: Sharp or increasing pain. Nausea and vomiting. Redness or dark color in the area of the hernia. How is this diagnosed? This condition is diagnosed based on: Your symptoms. Your medical history. A physical exam. You may be asked to cough or strain while standing. These actions increase the pressure inside your abdomen and force the hernia through the opening in your muscles. Your health care provider may try to reduce the hernia by pressing on it. Imaging tests, such as: Ultrasound. CT scan. How is this treated? Surgery is the only treatment for a femoral hernia. A strangulated hernia requires emergency surgery. Follow these instructions at home: Activity Do not lift anything that is heavier than 10 lb (4.5 kg), or the limit that you are told, until your health care provider says that it is safe. Return to your normal activities as told by your health care provider. Ask your health care provider what activities are safe for you. Eating and drinking  Follow instructions from your health care provider about eating or drinking restrictions. Eat more fiber to prevent constipation. Foods that contain fiber include fruits, vegetables, and whole grains. Drink enough fluid to keep your urine pale yellow. This also helps to prevent constipation. General instructions Take over-the-counter and prescription medicines only as told by your health care provider. If you are overweight, work with your health care provider to safely lose weight. Do not use any products that contain nicotine or tobacco. These products include  cigarettes, chewing tobacco, and vaping devices, such as e-cigarettes. If you need help quitting, ask your health care provider. Keep all follow-up visits. This is important. Contact a health care provider if: Your hernia becomes uncomfortable. Your hernia gets larger. You are constipated. Signs of constipation include: Fewer bowel movements in a week than  normal. Difficulty having a bowel movement. Stools that are dry, hard, or smaller than normal. You strain to pass urine. Get help right away if: Your hernia suddenly becomes painful. You have hernia pain that suddenly gets worse. You have hernia pain along with any of the following: Chills. Fever. Nausea. Vomiting. Your hernia bulge becomes dark, red, or painful to touch. Summary Having a femoral hernia means that fat or part of the intestine has pushed through a weak area between muscles into an opening in the lower groin (femoral canal). The most common sign of a femoral hernia is a bulge in the upper thigh or groin. In women, the bulge may form on the outside of the vagina instead. Surgery is the only treatment for a femoral hernia. This type of hernia will not go away if it is not treated. This information is not intended to replace advice given to you by your health care provider. Make sure you discuss any questions you have with your health care provider. Document Revised: 07/06/2020 Document Reviewed: 07/06/2020 Elsevier Patient Education  2022 [employer].

## 2021-11-10 ENCOUNTER — Ambulatory Visit: Payer: Managed Care, Other (non HMO)

## 2021-11-25 ENCOUNTER — Ambulatory Visit: Payer: Managed Care, Other (non HMO) | Admitting: Surgery

## 2021-12-26 DIAGNOSIS — Z8616 Personal history of COVID-19: Secondary | ICD-10-CM

## 2021-12-26 HISTORY — DX: Personal history of COVID-19: Z86.16

## 2022-02-03 ENCOUNTER — Other Ambulatory Visit: Payer: Self-pay

## 2022-02-03 ENCOUNTER — Encounter: Payer: Managed Care, Other (non HMO) | Attending: Student | Admitting: Dietician

## 2022-02-03 VITALS — Ht 63.0 in | Wt 268.2 lb

## 2022-02-03 DIAGNOSIS — R030 Elevated blood-pressure reading, without diagnosis of hypertension: Secondary | ICD-10-CM

## 2022-02-03 DIAGNOSIS — Z6841 Body Mass Index (BMI) 40.0 and over, adult: Secondary | ICD-10-CM | POA: Diagnosis not present

## 2022-02-03 DIAGNOSIS — Z713 Dietary counseling and surveillance: Secondary | ICD-10-CM | POA: Diagnosis not present

## 2022-02-03 NOTE — Progress Notes (Signed)
Medical Nutrition Therapy: Visit start time: 1630  end time: 1730  Assessment:  Diagnosis: obesity Past medical history: elevated BP, anxiety, depression Psychosocial issues/ stress concerns: anxiety/ depression  Preferred learning method:  Auditory Visual Hands-on   Current weight: 268.2lbs with shoes  Height: 5'3" BMI: 47.5  Medications, supplements: reconciled list in medical record  Progress and evaluation:  Patient reports significant weight loss after delivery of daughter 3 years ago but has since regained.  Had done weight watcher, herbalife, Trim (spoonful of supplement/ powder daily), low carb, keto  Does report some episodes of low BG symptoms  Physical activity: walking 60 minutes 3x a week  Dietary Intake:  Usual eating pattern includes 2-3 meals and 1 snacks per day. Dining out frequency: 0-1 meals per week.  Breakfast: sometimes berries/ protein bar -- quick; sometimes skips due to time Snack: fruit/ crackers Lunch: usu leftovers 2/9 cabbage  Snack: none Supper: chicken grilled or baked/ other meat + broccoli/ cauliflower/ other veg; 1/2 serving potatoes or other carb 1-2x a week Snack: none Beverages: water (other drinks taste bad with topiramate) tries to limit sweet tea  Nutrition Care Education: Topics covered:  Basic nutrition: basic food groups, appropriate nutrient balance, appropriate meal and snack schedule, general nutrition guidelines    Weight control: importance of low sugar and low fat choices, estimated energy needs for weight loss at 1400 kcal, provided guidance for 40% CHO, 30% protein, 30% fat; discussed role of stress on weight control; effects of undereating; role of physical activity; gradually increasing healthy carb choices for energy and healthy metabolism; importance of adequate protein intake Advanced nutrition:  cooking techniques -- pre-prepping some meals and/or snacks   Nutritional Diagnosis:  [place]-3.3 Overweight/obesity As related to  history of everweight/ obesity, history of excess calories, high stress level.  As evidenced by patient with current BMI of 47.5.  Intervention:  Instruction and discussion as noted above. Patient has been struggling to achieve weight loss, and has reduced kcal intake to very low level. 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 Established nutrition goals with input from patient, to ensure adequate nourishment and allow for weight loss.  Education Materials given:  Designer, industrial/product with food lists, sample eating pattern Sample menus (low carb 1200-1500kcal from AND bariatric manual ) Visit summary with goals/ instructions   Learner/ who was taught:  Patient  Spouse/ partner  Level of understanding: Verbalizes/ demonstrates competency   Demonstrated degree of understanding via:   Teach back Learning barriers: None  Willingness to learn/ readiness for change: Eager, change in progress   Monitoring and Evaluation:  Dietary intake, exercise, and body weight      follow up:  03/23/22 at 4:30pm

## 2022-02-03 NOTE — Patient Instructions (Signed)
Eat a light meal or snack within 1-2 hours of waking up; pre-prep some options that you can grab and go if needed.  Make sure to include at least a small source of protein + a veg or fruit + a small portion of a starch with meals.  Plan to eat every 3-5 hours during the day.  Great job limiting sugary foods and drinks! Keep it up! Continue with regular exercise

## 2022-03-04 ENCOUNTER — Encounter: Payer: BC Managed Care – PPO | Admitting: Obstetrics and Gynecology

## 2022-03-17 ENCOUNTER — Encounter: Payer: Managed Care, Other (non HMO) | Admitting: Obstetrics and Gynecology

## 2022-03-23 ENCOUNTER — Encounter: Payer: Self-pay | Admitting: Dietician

## 2022-03-23 ENCOUNTER — Encounter: Payer: Managed Care, Other (non HMO) | Attending: Student | Admitting: Dietician

## 2022-03-23 VITALS — Ht 63.0 in | Wt 274.1 lb

## 2022-03-23 DIAGNOSIS — R030 Elevated blood-pressure reading, without diagnosis of hypertension: Secondary | ICD-10-CM | POA: Insufficient documentation

## 2022-03-23 DIAGNOSIS — Z6841 Body Mass Index (BMI) 40.0 and over, adult: Secondary | ICD-10-CM | POA: Insufficient documentation

## 2022-03-23 NOTE — Progress Notes (Signed)
Medical Nutrition Therapy: Visit start time: 1640  end time: 1705  ?Assessment:  Diagnosis: obesity ?medical changes: no ?Psychosocial issues/ stress concerns: anxiety, depression ? ? ?Current weight: 274.1lbs Height: 5'3" BMI: 48.55 ? ?Medication changes: started propanolol, increased losartan dose in past month ? ?Progress and evaluation:  ?Reports less frequent migraines (3 every 2 weeks vs weekly) since starting propanolol, but is "out of commission" for at least 8 hours when having a migraine.  ?Has eliminated sodas, caffeine  ?She is eating on a more consistent schedule. ?Weight has increased from 268.2lbs at previous visit on 02/03/22, possibly due to increased number of "good" days without migraine that she is able to eat more.  ? ?Physical activity: outdoor play with kids daily ? ?Dietary Intake:  ?Usual eating pattern includes 3 meals and 1-2 snacks per day. ?Dining out frequency: ? meals per week. ? ?Breakfast: 2 eggs boiled ?Snack: frozen berries with stevia ?Lunch: leftovers palm size protein mostly chicken/ [place] vs red meat + veg ?Snack: fruit/ crackers ?Supper: salad with chicken or [place] + small portion starch/ carb ?Snack: none ?Beverages: water ? ?Intervention:  ? ?Nutrition Care Education: ? Basic nutrition: reviewed appropriate nutrient balance; appropriate meal and snack schedule   ?Weight control: reviewed progress since previous visit; discussed possible factors leading to weight gain; reviewed importance of low sugar and low fat choices, portion control; discussed option of tracking food intake ? ? ? ? ?Nutritional Diagnosis:  [place]-3.3 Overweight/obesity As related to history of excess calories and inadequate physical activity, possible effects of stress.  As evidenced by patient with current BMI of 48.55, working on healthy lifestyle habits to promote weight loss. ? ? ?Education Materials given:  ?Visit summary with goals/ instructions (to be viewed via Mychart) ? ? ?Learner/ who was taught:   ?Patient  ? ? ?Level of understanding: ?Verbalizes/ demonstrates competency ? ? ?Demonstrated degree of understanding via:   Teach back ?Learning barriers: ?None ? ?Willingness to learn/ readiness for change: ?Eager, change in progress ? ? ?Monitoring and Evaluation:  Dietary intake, exercise, and body weight ?     follow up: prn if weight continues to increase ?

## 2022-03-23 NOTE — Patient Instructions (Signed)
Continue with eating meals and snacks at regular intervals, along with some lean protein, veg and/or fruit and small amounts of starches.  ?Keep up the regular exercise, great job! ?If weight continues to increase, consider tracking food intake for 1-2 weeks.  ?

## 2022-05-05 ENCOUNTER — Other Ambulatory Visit (HOSPITAL_COMMUNITY)
Admission: RE | Admit: 2022-05-05 | Discharge: 2022-05-05 | Disposition: A | Discharge status: Home / Self Care | Payer: Managed Care, Other (non HMO) | Source: Ambulatory Visit | Attending: Obstetrics and Gynecology | Admitting: Obstetrics and Gynecology

## 2022-05-05 ENCOUNTER — Encounter: Payer: Self-pay | Admitting: Obstetrics and Gynecology

## 2022-05-05 ENCOUNTER — Ambulatory Visit (INDEPENDENT_AMBULATORY_CARE_PROVIDER_SITE_OTHER): Payer: Managed Care, Other (non HMO) | Admitting: Obstetrics and Gynecology

## 2022-05-05 VITALS — BP 138/96 | HR 85 | Ht 63.0 in | Wt 271.0 lb

## 2022-05-05 DIAGNOSIS — Z124 Encounter for screening for malignant neoplasm of cervix: Secondary | ICD-10-CM | POA: Diagnosis not present

## 2022-05-05 DIAGNOSIS — Z01419 Encounter for gynecological examination (general) (routine) without abnormal findings: Secondary | ICD-10-CM | POA: Insufficient documentation

## 2022-05-05 DIAGNOSIS — N870 Mild cervical dysplasia: Secondary | ICD-10-CM

## 2022-05-05 DIAGNOSIS — N72 Inflammatory disease of cervix uteri: Secondary | ICD-10-CM

## 2022-05-05 DIAGNOSIS — B977 Papillomavirus as the cause of diseases classified elsewhere: Secondary | ICD-10-CM

## 2022-05-05 NOTE — Progress Notes (Signed)
HPI: ?     [name] is a 31 y.o. [phone] who LMP was Patient's last menstrual period was 05/02/2022. ? ?Subjective:  ? ?She presents today for her annual examination.  She has much happier now that her IUD has been removed.  She reports all of her other symptoms have gone away.  She does complain of flank pain and believes that she has a kidney stone after being seen in urgent care.  She has asked her primary care doctor for referral for imaging studies because the "stone has not moved". ?Of significant note patient had a previous colposcopy showing CIN-1 and this is her follow-up Pap smear 1 year later. ?She does not want birth control at this time as she is considering pregnancy within the next year. ?She has not yet seen anyone about her possible hernia.  She has contacted [facility] but has not yet scheduled an appointment. ? ?  Hx: ?The following portions of the patient's history were reviewed and updated as appropriate: ?            She  has a past medical history of Anxiety, H/O chlamydia infection (01/27/2016), H/O trichomoniasis (12/14/2016), Kidney stone, Left groin pain, Personal history UTI, and Pregnancy induced hypertension. ?She does not have any pertinent problems on file. ?She  has a past surgical history that includes Tonsillectomy and adenoidectomy (2007) and CYST REMOVED (2013). ?Her family history includes Aneurysm in her paternal uncle; Anxiety disorder in her brother, father, and paternal aunt; Bipolar disorder in her paternal aunt; Diabetes in her paternal aunt and paternal grandmother; Heart disease in her father, maternal grandfather, and paternal grandfather. ?She  reports that she has never smoked. She has never used smokeless tobacco. She reports that she does not drink alcohol and does not use drugs. ?She has a current medication list which includes the following prescription(s): bupropion, buspirone, cyanocobalamin, escitalopram, fluticasone, losartan, propranolol, topiramate,  and vitamin d. ?She is allergic to benzonatate and covid-19 mrna vaccine (pfizer) [covid-19 mrna vacc (moderna)]. ?      ?Review of Systems:  ?Review of Systems ? ?Constitutional: Denied constitutional symptoms, night sweats, recent illness, fatigue, fever, insomnia and weight loss.  ?Eyes: Denied eye symptoms, eye pain, photophobia, vision change and visual disturbance.  ?Ears/Nose/Throat/Neck: Denied ear, nose, throat or neck symptoms, hearing loss, nasal discharge, sinus congestion and sore throat.  ?Cardiovascular: Denied cardiovascular symptoms, arrhythmia, chest pain/pressure, edema, exercise intolerance, orthopnea and palpitations.  ?Respiratory: Denied pulmonary symptoms, asthma, pleuritic pain, productive sputum, cough, dyspnea and wheezing.  ?Gastrointestinal: Denied, gastro-esophageal reflux, melena, nausea and vomiting.  ?Genitourinary: See HPI for additional information.  ?Musculoskeletal: Denied musculoskeletal symptoms, stiffness, swelling, muscle weakness and myalgia.  ?Dermatologic: Denied dermatology symptoms, rash and scar.  ?Neurologic: Denied neurology symptoms, dizziness, headache, neck pain and syncope.  ?Psychiatric: Denied psychiatric symptoms, anxiety and depression.  ?Endocrine: Denied endocrine symptoms including hot flashes and night sweats.  ? ?Meds: ?  ?Current Outpatient Medications on File Prior to Visit  ?Medication Sig Dispense Refill  ? buPROPion (WELLBUTRIN XL) 300 MG 24 hr tablet Take by mouth.    ? busPIRone (BUSPAR) 5 MG tablet Take 1-2 tablets (5-10 mg total) by mouth 2 (two) times daily as needed (for anxiety/stress/agitation sx). 60 tablet 2  ? Cyanocobalamin (VITAMIN B12 PO) Take by mouth.    ? escitalopram (LEXAPRO) 20 MG tablet Take 1 tablet (20 mg total) by mouth daily. 90 tablet 3  ? fluticasone (FLONASE) 50 MCG/ACT nasal spray Place into the  nose.    ? losartan (COZAAR) 50 MG tablet Take 1 tablet by mouth daily.    ? propranolol (INDERAL) 20 MG tablet Take by mouth.     ? topiramate (TOPAMAX) 100 MG tablet Take by mouth.    ? VITAMIN D PO Take 5,000 Int'l Units/day by mouth. D2    ? ?No current facility-administered medications on file prior to visit.  ? ? ? ?Objective:  ?  ? ?Vitals:  ? 05/05/22 0813  ?BP: (!) 138/96  ?Pulse: 85  ?  ?Filed Weights  ? 05/05/22 0813  ?Weight: 271 lb (122.9 kg)  ? ?  ?         Physical examination ?General NAD, Conversant  ?HEENT Atraumatic; Op clear with mmm.  Normo-cephalic. Pupils reactive. Anicteric sclerae  ?Thyroid/Neck Smooth without nodularity or enlargement. Normal ROM.  Neck Supple.  ?Skin No rashes, lesions or ulceration. Normal palpated skin turgor. No nodularity.  ?Breasts: No masses or discharge.  Symmetric.  No axillary adenopathy.  ?Lungs: Clear to auscultation.No rales or wheezes. Normal Respiratory effort, no retractions.  ?Heart: NSR.  No murmurs or rubs appreciated. No periferal edema  ?Abdomen: Soft.  Non-tender.  No masses.  No HSM. No hernia  ?Extremities: Moves all appropriately.  Normal ROM for age. No lymphadenopathy.  ?Neuro: Oriented to PPT.  Normal mood. Normal affect.  ? ?  Pelvic:   ?Vulva: Normal appearance.  No lesions.  ?Vagina: No lesions or abnormalities noted.  ?Support: Normal pelvic support.  ?Urethra No masses tenderness or scarring.  ?Meatus Normal size without lesions or prolapse.  ?Cervix: Normal appearance.  No lesions.  Anterior aspect of the cervix friable  ?Anus: Normal exam.  No lesions.  ?Perineum: Normal exam.  No lesions.  ?      Bimanual   ?Uterus: Normal size.  Non-tender.  Mobile.  AV.  ?Adnexae: No masses.  Non-tender to palpation.  ?Cul-de-sac: Negative for abnormality.  ? ?Exam somewhat limited by patient body habitus. ? ?Assessment:  ?  ?G2P2002 ?Patient Active Problem List  ? Diagnosis Date Noted  ? Situational stress 05/27/2020  ? Blood pressure elevated without history of HTN 05/27/2020  ? Post-dates pregnancy 02/18/2019  ? Current mild episode of major depressive disorder without prior  episode ([place]) 06/12/2018  ? Anxiety disorder 04/03/2018  ? Stress headaches 04/03/2018  ? Family history of diabetes mellitus 03/20/2018  ? H/O trichomoniasis 12/14/2016  ? ASCUS with positive high risk HPV 05/29/2016  ? Morbid obesity with BMI of 40.0-44.9, adult ([place]) 04/26/2016  ? H/O chlamydia infection 01/27/2016  ? ?  ?1. Well woman exam with routine gynecological exam   ?2. CIN I (cervical intraepithelial neoplasia I)   ?3. High risk human papilloma virus (HPV) infection of cervix   ?4. Cervical cancer screening   ? ?  ? ? ?Plan:  ?  ?       ? 1.  Basic Screening Recommendations ?The basic screening recommendations for asymptomatic women were discussed with the patient during her visit.  The age-appropriate recommendations were discussed with her and the rational for the tests reviewed.  When I am informed by the patient that another primary care physician has previously obtained the age-appropriate tests and they are up-to-date, only outstanding tests are ordered and referrals given as necessary.  Abnormal results of tests will be discussed with her when all of her results are completed.  Routine preventative health maintenance measures emphasized: Exercise/Diet/Weight control, Tobacco Warnings, Alcohol/Substance use risks and Stress Management ?  Follow-up Pap Co-test performed ? ?Orders ?No orders of the defined types were placed in this encounter. ? ? No orders of the defined types were placed in this encounter. ?    ?  ?  F/U ? Return in about 1 year (around 05/06/2023) for Annual Physical. ? ?Finis Bud, M.D. ?05/05/2022 ?8:50 AM ? ? ? ?

## 2022-05-05 NOTE — Progress Notes (Signed)
Patients presents for annual exam today. She states her periods are better now since her IUD removal, would like to stay off birth control at this time. Patient states her pelvic pain is still present, a stabbing pain that comes and goes.She also states she is suffering with a kidney stone at this time. Patient is due for pap smear, ordered. Patient states no other questions or concerns at this time.   ?

## 2022-05-09 LAB — CYTOLOGY - PAP
Comment: NEGATIVE
High risk HPV: POSITIVE — AB

## 2022-05-11 ENCOUNTER — Encounter: Payer: Self-pay | Admitting: Obstetrics and Gynecology

## 2022-05-11 ENCOUNTER — Ambulatory Visit (INDEPENDENT_AMBULATORY_CARE_PROVIDER_SITE_OTHER): Payer: Managed Care, Other (non HMO) | Admitting: Obstetrics and Gynecology

## 2022-05-11 ENCOUNTER — Other Ambulatory Visit (HOSPITAL_COMMUNITY)
Admission: RE | Admit: 2022-05-11 | Discharge: 2022-05-11 | Disposition: A | Discharge status: Home / Self Care | Payer: Managed Care, Other (non HMO) | Source: Ambulatory Visit | Attending: Obstetrics and Gynecology | Admitting: Obstetrics and Gynecology

## 2022-05-11 VITALS — BP 141/87 | HR 94 | Ht 63.0 in | Wt 271.7 lb

## 2022-05-11 DIAGNOSIS — R87618 Other abnormal cytological findings on specimens from cervix uteri: Secondary | ICD-10-CM | POA: Insufficient documentation

## 2022-05-11 DIAGNOSIS — R87612 Low grade squamous intraepithelial lesion on cytologic smear of cervix (LGSIL): Secondary | ICD-10-CM

## 2022-05-11 LAB — POCT URINE PREGNANCY: Preg Test, Ur: NEGATIVE

## 2022-05-11 NOTE — Progress Notes (Signed)
Patient presents today for colposcopy. She has a history of CIN I with a recent pap resulting in LGSIL and HPV+. She states no questions regarding procedure.  ?

## 2022-05-11 NOTE — Addendum Note (Signed)
Addended by: Loman Chroman on: 05/11/2022 08:59 AM ? ? Modules accepted: Orders ? ?

## 2022-05-11 NOTE — Progress Notes (Signed)
? ?HPI:  ?Rhonda Davies is a 31 y.o.  [phone]  who presents today for evaluation and management of abnormal cervical cytology.   ? ?Dysplasia History: 1 year ago patient underwent colposcopically directed biopsies showing CIN-1. ?Her subsequent Pap revealed LGSIL with positive HPV ? ?She presents today for her second colpo. ? ?ROS:  Pertinent items noted in HPI and remainder of comprehensive ROS otherwise negative. ? ?OB History  ?Gravida Para Term Preterm AB Living  ?2 2 2  0 0 2  ?SAB IAB Ectopic Multiple Live Births  ?0 0 0 0 2  ?  ?# Outcome Date GA Lbr Len/2nd Weight Sex Delivery Anes PTL Lv  ?2 Term 02/18/19 [redacted]w[redacted]d / 00:13 9 lb 2 oz (4.14 kg) F Vag-Spont EPI  LIV  ?1 Term 10/30/16 [redacted]w[redacted]d 00:55 4 lb 14.7 oz (2.23 kg) F Vag-Spont EPI  LIV  ?  ?Obstetric Comments  ?1. Growth restriction, PIH in pregnancy with severe-pre-eclampsia developing in labor.  ? ? ?Past Medical History:  ?Diagnosis Date  ? Anxiety   ? H/O chlamydia infection 01/27/2016  ? Chlamydia infection 12/2015, treated.    ? H/O trichomoniasis 12/14/2016  ? Kidney stone   ? Left groin pain   ? Personal history UTI   ? Pregnancy induced hypertension   ? ? ?Past Surgical History:  ?Procedure Laterality Date  ? CYST REMOVED  2013  ? IN NECK  ? TONSILLECTOMY AND ADENOIDECTOMY  2007  ? ? ?SOCIAL HISTORY: ? ?Social History  ? ?Substance and Sexual Activity  ?Alcohol Use No  ? Alcohol/week: 1.0 standard drink  ? Types: 1 Standard drinks or equivalent per week  ? Comment: socially  ? ? ?Social History  ? ?Substance and Sexual Activity  ?Drug Use No  ? ? ? ?Family History  ?Problem Relation Age of Onset  ? Heart disease Father   ? Anxiety disorder Father   ? Heart disease Maternal Grandfather   ? Diabetes Paternal Grandmother   ? Heart disease Paternal Grandfather   ? Anxiety disorder Paternal Aunt   ? Bipolar disorder Paternal Aunt   ? Diabetes Paternal Aunt   ? Anxiety disorder Brother   ? Aneurysm Paternal Uncle   ? Breast cancer Neg Hx   ? Colon cancer  Neg Hx   ? Ovarian cancer Neg Hx   ? ? ?ALLERGIES:  Benzonatate and Covid-19 mrna vaccine (pfizer) [covid-19 mrna vacc (moderna)] ? ?She has a current medication list which includes the following prescription(s): bupropion, buspirone, cyanocobalamin, escitalopram, fluticasone, losartan, propranolol, topiramate, and vitamin d. ? ?Physical Exam: ?-Vitals:  ?BP (!) 141/87   Pulse 94   Ht 5\' 3"  (1.6 m)   Wt 271 lb 11.2 oz (123.2 kg)   LMP 05/02/2022 (Exact Date)   BMI 48.13 kg/m?  ? ?PROCEDURE: ?Colposcopy performed with 4% acetic acid after verbal consent obtained ?             ?             -Aceto-white Lesions Location(s): See images above ?             -Biopsy performed at 7 and 11 o'clock  ?             -ECC indicated and performed: No. ?    -Biopsy sites made hemostatic with pressure and Monsel's solution ?  -Satisfactory colposcopy: Yes.    ?  -Evidence of Invasive cervical CA :  NO ? ?ASSESSMENT:  Rhonda Davies is a  31 y.o. [id number] here for  ?1. Low grade squamous intraepithelial lesion on cytologic smear of cervix (LGSIL)   ?2. Abnormal Papanicolaou smear of cervix with positive human papilloma virus (HPV) test   ?. ? ?PLAN: ?1.  I discussed the grading system of pap smears and HPV high risk viral types.  We will discuss management after colpo results return. ? ?No orders of the defined types were placed in this encounter. ?  ?    ?   F/U ? Return in about 2 weeks (around 05/25/2022). ? ?Jeannie Fend ,MD ?05/11/2022,8:51 AM ?

## 2022-05-12 LAB — SURGICAL PATHOLOGY

## 2022-05-15 NOTE — Progress Notes (Signed)
Emojean: Your results are once again CIN I.  (No change)  I suggest we wait again for 1 year as we discussed and then re-colpo.

## 2022-05-16 ENCOUNTER — Ambulatory Visit (HOSPITAL_COMMUNITY): Payer: Managed Care, Other (non HMO)

## 2022-05-16 ENCOUNTER — Encounter (HOSPITAL_BASED_OUTPATIENT_CLINIC_OR_DEPARTMENT_OTHER): Payer: Self-pay | Admitting: Urology

## 2022-05-16 ENCOUNTER — Encounter (HOSPITAL_BASED_OUTPATIENT_CLINIC_OR_DEPARTMENT_OTHER)
Admission: RE | Disposition: A | Discharge status: Home / Self Care | Payer: Self-pay | Source: Home / Self Care | Attending: Urology

## 2022-05-16 ENCOUNTER — Other Ambulatory Visit: Payer: Self-pay

## 2022-05-16 ENCOUNTER — Ambulatory Visit (HOSPITAL_BASED_OUTPATIENT_CLINIC_OR_DEPARTMENT_OTHER)
Admission: RE | Admit: 2022-05-16 | Discharge: 2022-05-16 | Disposition: A | Discharge status: Home / Self Care | Payer: Managed Care, Other (non HMO) | Attending: Urology | Admitting: Urology

## 2022-05-16 ENCOUNTER — Other Ambulatory Visit: Payer: Self-pay | Admitting: Urology

## 2022-05-16 DIAGNOSIS — Z01818 Encounter for other preprocedural examination: Secondary | ICD-10-CM

## 2022-05-16 DIAGNOSIS — I10 Essential (primary) hypertension: Secondary | ICD-10-CM | POA: Diagnosis not present

## 2022-05-16 DIAGNOSIS — N201 Calculus of ureter: Secondary | ICD-10-CM | POA: Diagnosis not present

## 2022-05-16 DIAGNOSIS — N135 Crossing vessel and stricture of ureter without hydronephrosis: Secondary | ICD-10-CM | POA: Diagnosis present

## 2022-05-16 DIAGNOSIS — Z87440 Personal history of urinary (tract) infections: Secondary | ICD-10-CM | POA: Insufficient documentation

## 2022-05-16 HISTORY — PX: EXTRACORPOREAL SHOCK WAVE LITHOTRIPSY: SHX1557

## 2022-05-16 LAB — POCT PREGNANCY, URINE: Preg Test, Ur: NEGATIVE

## 2022-05-16 SURGERY — LITHOTRIPSY, ESWL
Anesthesia: LOCAL | Laterality: Left

## 2022-05-16 MED ORDER — SODIUM CHLORIDE 0.9 % IV SOLN: INTRAVENOUS | Status: DC

## 2022-05-16 MED ORDER — DIAZEPAM 5 MG PO TABS
ORAL_TABLET | ORAL | Status: AC
  Filled 2022-05-16: qty 2

## 2022-05-16 MED ORDER — DIPHENHYDRAMINE HCL 25 MG PO CAPS
25.0000 mg | ORAL_CAPSULE | ORAL | Status: AC
Start: 2022-05-16 — End: 2022-05-16
  Administered 2022-05-16: 25 mg via ORAL

## 2022-05-16 MED ORDER — TAMSULOSIN HCL 0.4 MG PO CAPS: 0.4000 mg | ORAL_CAPSULE | Freq: Every day | ORAL | 1 refills | Status: DC

## 2022-05-16 MED ORDER — SODIUM CHLORIDE 0.9% FLUSH: 3.0000 mL | Freq: Two times a day (BID) | INTRAVENOUS | Status: DC

## 2022-05-16 MED ORDER — CIPROFLOXACIN HCL 500 MG PO TABS
ORAL_TABLET | ORAL | Status: AC
  Filled 2022-05-16: qty 1

## 2022-05-16 MED ORDER — HYDROCODONE-ACETAMINOPHEN 10-325 MG PO TABS: ORAL_TABLET | ORAL | 0 refills | Status: DC

## 2022-05-16 MED ORDER — CIPROFLOXACIN HCL 500 MG PO TABS
500.0000 mg | ORAL_TABLET | ORAL | Status: AC
  Administered 2022-05-16: 500 mg via ORAL

## 2022-05-16 MED ORDER — DIAZEPAM 5 MG PO TABS
10.0000 mg | ORAL_TABLET | ORAL | Status: AC
  Administered 2022-05-16: 10 mg via ORAL

## 2022-05-16 MED ORDER — DIPHENHYDRAMINE HCL 25 MG PO CAPS
ORAL_CAPSULE | ORAL | Status: AC
  Filled 2022-05-16: qty 1

## 2022-05-16 NOTE — H&P (Signed)
I have kidney stones.  HPI: Rhonda Davies is a 31 year-old female patient who is here for renal calculi.    Rhonda Davies is a 31 yo female who had a recent KUB that showed a 1cm calcification overlying the left renal pelvis. She has been having some pain since 04/19/22 and was treated for a UTI but she had persistent pain that became more severe and she was [facility] and had the KUB on 05/06/22. She is still having some pain today. She had labs on 5/11 and her Cr was mildly elevated at 1.3. Her cultures on 4/25 and 5/11 had Mx species. She has not had prior stones. She is on topomax for several months.      ALLERGIES:      MEDICATIONS: Tamsulosin Hcl 0.4 mg capsule  Buspirone Hcl  Flonase Allergy Relief  Imitrex  Lexapro 20 mg tablet  Losartan Potassium  Ondansetron Hcl  Propranolol Hcl  Topiramate  Wellbutrin Sr     GU PSH: None     PSH Notes: ?thyroglossal duct cyst removal.   NON-GU PSH: Remove Adenoids Tonsillectomy     GU PMH: None   NON-GU PMH: Anxiety Depression Hypertension    FAMILY HISTORY: Heart Attack - Father High Blood Pressure - Father Kidney Stones - Father   SOCIAL HISTORY: Marital Status: Single Preferred Language: English; Ethnicity: Not Hispanic Or Latino; Race: White Current Smoking Status: Patient has never smoked.   Tobacco Use Assessment Completed: Used Tobacco in last 30 days? Does not use smokeless tobacco. Does not use drugs. Does not drink caffeine.    REVIEW OF SYSTEMS:    GU Review Female:   Patient reports get up at night to urinate. Patient denies frequent urination, hard to postpone urination, burning /pain with urination, leakage of urine, stream starts and stops, trouble starting your stream, have to strain to urinate, and being pregnant.  Gastrointestinal (Upper):   Patient reports nausea and vomiting. Patient denies indigestion/ heartburn.  Gastrointestinal (Lower):   Patient denies diarrhea and constipation.   Constitutional:   Patient denies fever, night sweats, weight loss, and fatigue.  Skin:   Patient denies skin rash/ lesion and itching.  Eyes:   Patient denies blurred vision and double vision.  Ears/ Nose/ Throat:   Patient denies sore throat and sinus problems.  Hematologic/Lymphatic:   Patient denies swollen glands and easy bruising.  Cardiovascular:   Patient denies leg swelling and chest pains.  Respiratory:   Patient denies cough and shortness of breath.  Endocrine:   Patient denies excessive thirst.  Musculoskeletal:   Patient denies back pain and joint pain.  Neurological:   Patient reports headaches. Patient denies dizziness.  Psychologic:   Patient reports depression and anxiety.    Notes: Blood in urine, urinary tract infection    VITAL SIGNS:      05/16/2022 08:43 AM  Weight 268 lb / 121.56 kg  Height 63 in / 160.02 cm  BP 165/98 mmHg  Heart Rate 84 /min  Temperature 98.4 F / 36.8 C  BMI 47.5 kg/m   MULTI-SYSTEM PHYSICAL EXAMINATION:    Constitutional: Obese. No physical deformities. Normally developed. Good grooming.   Neck: Neck symmetrical, not swollen. Normal tracheal position.  Respiratory: Normal breath sounds. No labored breathing, no use of accessory muscles.   Cardiovascular: Regular rate and rhythm. No murmur, no gallop.   Skin: No paleness, no jaundice, no cyanosis. No lesion, no ulcer, no rash.  Neurologic / Psychiatric: Oriented to   time, oriented to place, oriented to person. No depression, no anxiety, no agitation.  Gastrointestinal: Abdominal tenderness left flank, obese. No mass, no rigidity.   Musculoskeletal: Normal gait and station of head and neck.     Complexity of Data:  Lab Test Review:   BMP, CBC with Diff  Records Review:   Previous Doctor Records  Urine Test Review:   Urinalysis  X-Ray Review: KUB: Reviewed Report. Discussed With Patient.  C.T. Stone Protocol: Reviewed Films. Discussed With Patient.     PROCEDURES:         C.T.  Urogram - 74176  There is a 6x10mm LUPJ stone with mild hydro and small LLP stones. A full report is pending.       Patient confirmed No Neulasta OnPro Device.         Urinalysis w/Scope Dipstick Dipstick Cont'd Micro  Color: Yellow Bilirubin: Neg mg/dL WBC/hpf: 6 - 10/hpf  Appearance: Cloudy Ketones: Neg mg/dL RBC/hpf: 0 - 2/hpf  Specific Gravity: 1.020 Blood: Neg ery/uL Bacteria: Mod (26-50/hpf)  pH: 6.5 Protein: 1+ mg/dL Cystals: Amorph Urates  Glucose: Neg mg/dL Urobilinogen: 0.2 mg/dL Casts: NS (Not Seen)    Nitrites: Neg Trichomonas: Not Present    Leukocyte Esterase: 3+ leu/uL Mucous: Present      Epithelial Cells: 6 - 10/hpf      Yeast: NS (Not Seen)      Sperm: Not Present    ASSESSMENT:      ICD-10 Details  1 GU:   Renal calculus - N20.0 Left, Acute, Systemic Symptoms - She has a 10x6mm LUPJ stone with obstruction and smaller LLP stones. I have discussed the options including MET, ESWL and URS and she would like to proceed with ESWL today. I reviewed the risks of ESWL including bleeding, infection, injury to the kidney or adjacent structures, failure to fragment the stone, need for ancillary procedures, thrombotic events, cardiac arrhythmias and sedation complications.   Her UA has some abnormalities but she has had 2 cultures in the last month and both were Mx species. The UA is most consistent with a contaminant.     2   Flank Pain - R10.84 Left, Acute, Uncomplicated  3   Ureteral obstruction secondary to calculous - N13.2 Left, Acute, Uncomplicated   PLAN:           Orders Labs Urine Preg.(Stat)  X-Rays: C.T. Stone Protocol Without I.V. Contrast - possible 1cm renal stone.   X-Ray Notes: History:  Hematuria: Yes/No  Patient to see MD after exam: Yes/No  Previous exam: CT / IVP/ US/ KUB/ None  When:  Where:  Diabetic: Yes/ No  BUN/ Creatinine:  Date of last BUN Creatinine:  Weight in pounds:  Allergy- IV Contrast: Yes/ No  Conflicting diabetic  meds: Yes/ No  Diabetic Meds:  Prior Authorization #: Per [employer] NPCR ref #[id number]            Schedule Return Visit/Planned Activity: 3 Weeks - Office Visit, Extender  Procedure: 05/16/2022 - ESWL - [id number], left          Document Letter(s):  Created for Patient: Clinical Summary         Notes:   CC: Danielle Carter PA.         Next Appointment:      Next Appointment: 05/30/2022 03:15 PM    Appointment Type: KUB    Location: [facility] - [id number]    Provider: KUB KUB    Reason for Visit: KUB POST   OP      

## 2022-05-16 NOTE — Interval H&P Note (Signed)
History and Physical Interval Note:  05/16/2022 4:30 PM  Rhonda Davies  has presented today for surgery, with the diagnosis of lwdt ureteral pelvic junction stone.  The various methods of treatment have been discussed with the patient and family. After consideration of risks, benefits and other options for treatment, the patient has consented to  Procedure(s): EXTRACORPOREAL SHOCK WAVE LITHOTRIPSY (ESWL) (Left) as a surgical intervention.  The patient's history has been reviewed, patient examined, no change in status, stable for surgery.  I have reviewed the patient's chart and labs.  Questions were answered to the patient's satisfaction.     [name]

## 2022-05-17 ENCOUNTER — Encounter (HOSPITAL_BASED_OUTPATIENT_CLINIC_OR_DEPARTMENT_OTHER): Payer: Self-pay | Admitting: Urology

## 2022-05-18 ENCOUNTER — Encounter: Payer: Self-pay | Admitting: Urology

## 2022-05-25 ENCOUNTER — Telehealth: Payer: Managed Care, Other (non HMO) | Admitting: Obstetrics and Gynecology

## 2022-05-31 ENCOUNTER — Ambulatory Visit: Payer: Managed Care, Other (non HMO) | Admitting: Urology

## 2022-05-31 ENCOUNTER — Other Ambulatory Visit: Payer: Self-pay | Admitting: Urology

## 2022-06-17 ENCOUNTER — Encounter (HOSPITAL_BASED_OUTPATIENT_CLINIC_OR_DEPARTMENT_OTHER): Payer: Self-pay | Admitting: Urology

## 2022-06-23 NOTE — Anesthesia Preprocedure Evaluation (Addendum)
Anesthesia Evaluation  Patient identified by MRN, date of birth, ID band Patient awake    Reviewed: Allergy & Precautions, NPO status , Patient's Chart, lab work & pertinent test results, reviewed documented beta blocker date and time   History of Anesthesia Complications Negative for: history of anesthetic complications  Airway Mallampati: II  TM Distance: >3 FB Neck ROM: Full    Dental  (+) Dental Advisory Given, Teeth Intact   Pulmonary neg pulmonary ROS,    Pulmonary exam normal        Cardiovascular hypertension (hx PIH), Pt. on home beta blockers and Pt. on medications Normal cardiovascular exam     Neuro/Psych  Headaches, PSYCHIATRIC DISORDERS Anxiety Depression    GI/Hepatic negative GI ROS, Neg liver ROS,   Endo/Other  Morbid obesity  Renal/GU  Renal calculus      Musculoskeletal negative musculoskeletal ROS (+)   Abdominal   Peds  Hematology negative hematology ROS (+)   Anesthesia Other Findings   Reproductive/Obstetrics                            Anesthesia Physical Anesthesia Plan  ASA: 3  Anesthesia Plan: General   Post-op Pain Management: Tylenol PO (pre-op)* and Celebrex PO (pre-op)*   Induction: Intravenous  PONV Risk Score and Plan: 3 and Treatment may vary due to age or medical condition, Ondansetron, Dexamethasone and Midazolam  Airway Management Planned: LMA  Additional Equipment: None  Intra-op Plan:   Post-operative Plan: Extubation in OR  Informed Consent: I have reviewed the patients History and Physical, chart, labs and discussed the procedure including the risks, benefits and alternatives for the proposed anesthesia with the patient or authorized representative who has indicated his/her understanding and acceptance.     Dental advisory given  Plan Discussed with: CRNA and Anesthesiologist  Anesthesia Plan Comments:        Anesthesia  Quick Evaluation

## 2022-06-24 ENCOUNTER — Encounter (HOSPITAL_BASED_OUTPATIENT_CLINIC_OR_DEPARTMENT_OTHER): Payer: Self-pay | Admitting: Urology

## 2022-06-24 ENCOUNTER — Ambulatory Visit (HOSPITAL_BASED_OUTPATIENT_CLINIC_OR_DEPARTMENT_OTHER)
Admission: RE | Admit: 2022-06-24 | Discharge: 2022-06-24 | Disposition: A | Discharge status: Home / Self Care | Payer: Managed Care, Other (non HMO) | Attending: Urology | Admitting: Urology

## 2022-06-24 ENCOUNTER — Ambulatory Visit (HOSPITAL_BASED_OUTPATIENT_CLINIC_OR_DEPARTMENT_OTHER): Payer: Managed Care, Other (non HMO) | Admitting: Anesthesiology

## 2022-06-24 ENCOUNTER — Encounter (HOSPITAL_BASED_OUTPATIENT_CLINIC_OR_DEPARTMENT_OTHER)
Admission: RE | Disposition: A | Discharge status: Home / Self Care | Payer: Self-pay | Source: Home / Self Care | Attending: Urology

## 2022-06-24 ENCOUNTER — Ambulatory Visit (HOSPITAL_COMMUNITY): Payer: Managed Care, Other (non HMO)

## 2022-06-24 ENCOUNTER — Other Ambulatory Visit: Payer: Self-pay

## 2022-06-24 DIAGNOSIS — R519 Headache, unspecified: Secondary | ICD-10-CM | POA: Insufficient documentation

## 2022-06-24 DIAGNOSIS — Z6841 Body Mass Index (BMI) 40.0 and over, adult: Secondary | ICD-10-CM

## 2022-06-24 DIAGNOSIS — F32A Depression, unspecified: Secondary | ICD-10-CM | POA: Insufficient documentation

## 2022-06-24 DIAGNOSIS — N135 Crossing vessel and stricture of ureter without hydronephrosis: Secondary | ICD-10-CM | POA: Diagnosis present

## 2022-06-24 DIAGNOSIS — F419 Anxiety disorder, unspecified: Secondary | ICD-10-CM | POA: Insufficient documentation

## 2022-06-24 DIAGNOSIS — Z01818 Encounter for other preprocedural examination: Secondary | ICD-10-CM

## 2022-06-24 DIAGNOSIS — F418 Other specified anxiety disorders: Secondary | ICD-10-CM

## 2022-06-24 DIAGNOSIS — I10 Essential (primary) hypertension: Secondary | ICD-10-CM | POA: Insufficient documentation

## 2022-06-24 DIAGNOSIS — N202 Calculus of kidney with calculus of ureter: Secondary | ICD-10-CM | POA: Diagnosis not present

## 2022-06-24 DIAGNOSIS — N201 Calculus of ureter: Secondary | ICD-10-CM

## 2022-06-24 HISTORY — DX: Major depressive disorder, single episode, unspecified: F32.9

## 2022-06-24 HISTORY — DX: Migraine, unspecified, not intractable, without status migrainosus: G43.909

## 2022-06-24 HISTORY — DX: Personal history of other complications of pregnancy, childbirth and the puerperium: Z87.59

## 2022-06-24 HISTORY — DX: Presence of spectacles and contact lenses: Z97.3

## 2022-06-24 HISTORY — DX: Essential (primary) hypertension: I10

## 2022-06-24 HISTORY — PX: CYSTOSCOPY/URETEROSCOPY/HOLMIUM LASER/STENT PLACEMENT: SHX6546

## 2022-06-24 HISTORY — DX: Calculus of ureter: N20.1

## 2022-06-24 HISTORY — DX: Generalized anxiety disorder: F41.1

## 2022-06-24 LAB — POCT I-STAT, CHEM 8
BUN: 11 mg/dL (ref 6–20)
Calcium, Ion: 1.23 mmol/L (ref 1.15–1.40)
Chloride: 104 mmol/L (ref 98–111)
Creatinine, Ser: 0.9 mg/dL (ref 0.44–1.00)
Glucose, Bld: 103 mg/dL — ABNORMAL HIGH (ref 70–99)
HCT: 37 % (ref 36.0–46.0)
Hemoglobin: 12.6 g/dL (ref 12.0–15.0)
Potassium: 4 mmol/L (ref 3.5–5.1)
Sodium: 141 mmol/L (ref 135–145)
TCO2: 22 mmol/L (ref 22–32)

## 2022-06-24 LAB — POCT PREGNANCY, URINE: Preg Test, Ur: NEGATIVE

## 2022-06-24 SURGERY — CYSTOSCOPY/URETEROSCOPY/HOLMIUM LASER/STENT PLACEMENT
Anesthesia: General | Site: Ureter | Laterality: Left

## 2022-06-24 MED ORDER — 0.9 % SODIUM CHLORIDE (POUR BTL) OPTIME
TOPICAL | Status: DC | PRN
  Administered 2022-06-24: 500 mL

## 2022-06-24 MED ORDER — MIDAZOLAM HCL 2 MG/2ML IJ SOLN
INTRAMUSCULAR | Status: AC
  Filled 2022-06-24: qty 2

## 2022-06-24 MED ORDER — MORPHINE SULFATE (PF) 4 MG/ML IV SOLN: 2.0000 mg | INTRAVENOUS | Status: DC | PRN

## 2022-06-24 MED ORDER — OXYCODONE HCL 5 MG PO TABS: 5.0000 mg | ORAL_TABLET | Freq: Once | ORAL | Status: DC | PRN

## 2022-06-24 MED ORDER — PROPOFOL 10 MG/ML IV BOLUS
INTRAVENOUS | Status: AC
  Filled 2022-06-24: qty 20

## 2022-06-24 MED ORDER — LIDOCAINE 2% (20 MG/ML) 5 ML SYRINGE
INTRAMUSCULAR | Status: DC | PRN
  Administered 2022-06-24: 60 mg via INTRAVENOUS

## 2022-06-24 MED ORDER — HYDROCODONE-ACETAMINOPHEN 10-325 MG PO TABS
1.0000 | ORAL_TABLET | Freq: Four times a day (QID) | ORAL | 0 refills | Status: DC | PRN
Start: 2022-06-24 — End: 2023-05-16

## 2022-06-24 MED ORDER — PROPOFOL 500 MG/50ML IV EMUL
INTRAVENOUS | Status: AC
  Filled 2022-06-24: qty 50

## 2022-06-24 MED ORDER — SODIUM CHLORIDE 0.9 % IV SOLN: 250.0000 mL | INTRAVENOUS | Status: DC | PRN

## 2022-06-24 MED ORDER — ACETAMINOPHEN 500 MG PO TABS
1000.0000 mg | ORAL_TABLET | Freq: Once | ORAL | Status: AC
  Administered 2022-06-24: 1000 mg via ORAL

## 2022-06-24 MED ORDER — OXYCODONE HCL 5 MG PO TABS
ORAL_TABLET | ORAL | Status: AC
  Filled 2022-06-24: qty 1

## 2022-06-24 MED ORDER — CELECOXIB 200 MG PO CAPS
200.0000 mg | ORAL_CAPSULE | Freq: Once | ORAL | Status: AC
  Administered 2022-06-24: 200 mg via ORAL

## 2022-06-24 MED ORDER — FENTANYL CITRATE (PF) 100 MCG/2ML IJ SOLN
INTRAMUSCULAR | Status: AC
  Filled 2022-06-24: qty 2

## 2022-06-24 MED ORDER — OXYCODONE HCL 5 MG PO TABS
5.0000 mg | ORAL_TABLET | ORAL | Status: DC | PRN
  Administered 2022-06-24: 5 mg via ORAL

## 2022-06-24 MED ORDER — ACETAMINOPHEN 325 MG RE SUPP: 650.0000 mg | RECTAL | Status: DC | PRN

## 2022-06-24 MED ORDER — SODIUM CHLORIDE 0.9 % IR SOLN
Status: DC | PRN
  Administered 2022-06-24: 3000 mL

## 2022-06-24 MED ORDER — CEFAZOLIN SODIUM-DEXTROSE 2-4 GM/100ML-% IV SOLN
2.0000 g | INTRAVENOUS | Status: AC
  Administered 2022-06-24: 2 g via INTRAVENOUS

## 2022-06-24 MED ORDER — FENTANYL CITRATE (PF) 100 MCG/2ML IJ SOLN: 25.0000 ug | INTRAMUSCULAR | Status: DC | PRN

## 2022-06-24 MED ORDER — PROPOFOL 10 MG/ML IV BOLUS
INTRAVENOUS | Status: DC | PRN
  Administered 2022-06-24: 100 mg via INTRAVENOUS
  Administered 2022-06-24: 200 mg via INTRAVENOUS

## 2022-06-24 MED ORDER — ONDANSETRON HCL 4 MG/2ML IJ SOLN: 4.0000 mg | Freq: Once | INTRAMUSCULAR | Status: DC | PRN

## 2022-06-24 MED ORDER — MIDAZOLAM HCL 5 MG/5ML IJ SOLN
INTRAMUSCULAR | Status: DC | PRN
  Administered 2022-06-24: 2 mg via INTRAVENOUS

## 2022-06-24 MED ORDER — DEXAMETHASONE SODIUM PHOSPHATE 10 MG/ML IJ SOLN
INTRAMUSCULAR | Status: DC | PRN
  Administered 2022-06-24: 10 mg via INTRAVENOUS

## 2022-06-24 MED ORDER — ACETAMINOPHEN 325 MG PO TABS: 650.0000 mg | ORAL_TABLET | ORAL | Status: DC | PRN

## 2022-06-24 MED ORDER — PHENAZOPYRIDINE HCL 200 MG PO TABS: 200.0000 mg | ORAL_TABLET | Freq: Three times a day (TID) | ORAL | 1 refills | Status: DC | PRN

## 2022-06-24 MED ORDER — CELECOXIB 200 MG PO CAPS
ORAL_CAPSULE | ORAL | Status: AC
  Filled 2022-06-24: qty 1

## 2022-06-24 MED ORDER — OXYCODONE HCL 5 MG/5ML PO SOLN: 5.0000 mg | Freq: Once | ORAL | Status: DC | PRN

## 2022-06-24 MED ORDER — LIDOCAINE HCL (PF) 2 % IJ SOLN
INTRAMUSCULAR | Status: AC
  Filled 2022-06-24: qty 5

## 2022-06-24 MED ORDER — LACTATED RINGERS IV SOLN: INTRAVENOUS | Status: DC

## 2022-06-24 MED ORDER — ACETAMINOPHEN 500 MG PO TABS
ORAL_TABLET | ORAL | Status: AC
  Filled 2022-06-24: qty 2

## 2022-06-24 MED ORDER — SODIUM CHLORIDE 0.9% FLUSH: 3.0000 mL | Freq: Two times a day (BID) | INTRAVENOUS | Status: DC

## 2022-06-24 MED ORDER — DEXAMETHASONE SODIUM PHOSPHATE 10 MG/ML IJ SOLN
INTRAMUSCULAR | Status: AC
  Filled 2022-06-24: qty 1

## 2022-06-24 MED ORDER — CEFAZOLIN SODIUM-DEXTROSE 2-4 GM/100ML-% IV SOLN
INTRAVENOUS | Status: AC
  Filled 2022-06-24: qty 100

## 2022-06-24 MED ORDER — SODIUM CHLORIDE 0.9% FLUSH: 3.0000 mL | INTRAVENOUS | Status: DC | PRN

## 2022-06-24 MED ORDER — IOHEXOL 300 MG/ML  SOLN
INTRAMUSCULAR | Status: DC | PRN
  Administered 2022-06-24: 5 mL via URETHRAL

## 2022-06-24 MED ORDER — ONDANSETRON HCL 4 MG/2ML IJ SOLN
INTRAMUSCULAR | Status: AC
  Filled 2022-06-24: qty 2

## 2022-06-24 MED ORDER — FENTANYL CITRATE (PF) 100 MCG/2ML IJ SOLN
INTRAMUSCULAR | Status: DC | PRN
  Administered 2022-06-24 (×2): 50 ug via INTRAVENOUS

## 2022-06-24 MED ORDER — PROPOFOL 500 MG/50ML IV EMUL
INTRAVENOUS | Status: DC | PRN
  Administered 2022-06-24: 25 ug/kg/min via INTRAVENOUS

## 2022-06-24 MED ORDER — ONDANSETRON HCL 4 MG/2ML IJ SOLN
INTRAMUSCULAR | Status: DC | PRN
  Administered 2022-06-24: 4 mg via INTRAVENOUS

## 2022-06-24 SURGICAL SUPPLY — 23 items
BAG DRAIN URO-CYSTO SKYTR STRL (DRAIN) ×2 IMPLANT
BAG DRN UROCATH (DRAIN) ×1
CATH URET 5FR 28IN OPEN ENDED (CATHETERS) ×1 IMPLANT
CLOTH BEACON ORANGE TIMEOUT ST (SAFETY) ×2 IMPLANT
COVER DOME SNAP 22 D (MISCELLANEOUS) ×1 IMPLANT
EXTRACTOR STONE 1.7FRX115CM (UROLOGICAL SUPPLIES) ×1 IMPLANT
GLOVE BIOGEL PI IND STRL 6.5 (GLOVE) IMPLANT
GLOVE BIOGEL PI INDICATOR 6.5 (GLOVE) ×2
GLOVE SURG SS PI 8.0 STRL IVOR (GLOVE) ×2 IMPLANT
GOWN STRL REUS W/TWL LRG LVL3 (GOWN DISPOSABLE) ×1 IMPLANT
GOWN STRL REUS W/TWL XL LVL3 (GOWN DISPOSABLE) ×2 IMPLANT
GUIDEWIRE STR DUAL SENSOR (WIRE) ×2 IMPLANT
IV NS IRRIG 3000ML ARTHROMATIC (IV SOLUTION) ×2 IMPLANT
KIT TURNOVER CYSTO (KITS) ×2 IMPLANT
MANIFOLD NEPTUNE II (INSTRUMENTS) ×2 IMPLANT
NS IRRIG 500ML POUR BTL (IV SOLUTION) ×2 IMPLANT
PACK CYSTO (CUSTOM PROCEDURE TRAY) ×2 IMPLANT
SHEATH NAVIGATOR HD 11/13X36 (SHEATH) ×1 IMPLANT
STENT URET 6FRX24 CONTOUR (STENTS) ×1 IMPLANT
TRACTIP FLEXIVA PULS ID 200XHI (Laser) IMPLANT
TRACTIP FLEXIVA PULSE ID 200 (Laser) ×2
TUBE CONNECTING 12X1/4 (SUCTIONS) ×2 IMPLANT
TUBING UROLOGY SET (TUBING) ×1 IMPLANT

## 2022-06-24 NOTE — Anesthesia Procedure Notes (Signed)
Procedure Name: LMA Insertion Date/Time: 06/24/2022 8:55 AM  Performed by: Bishop Limbo, CRNAPre-anesthesia Checklist: Patient identified, Emergency Drugs available, Suction available and Patient being monitored Patient Re-evaluated:Patient Re-evaluated prior to induction Oxygen Delivery Method: Circle System Utilized Preoxygenation: Pre-oxygenation with 100% oxygen Induction Type: IV induction Ventilation: Mask ventilation without difficulty LMA: LMA inserted LMA Size: 4.0 Number of attempts: 1 Placement Confirmation: positive ETCO2 Tube secured with: Tape Dental Injury: Teeth and Oropharynx as per pre-operative assessment

## 2022-06-24 NOTE — Anesthesia Postprocedure Evaluation (Signed)
Anesthesia Post Note  Patient: Rhonda Davies  Procedure(s) Performed: CYSTOSCOPY/LEFT URETEROSCOPY/HOLMIUM LASER/ LEFT RETROGRADE PYELOGRAM/STONE EXTRACTION/  LEFT STENT PLACEMENT (Left: Ureter)     Patient location during evaluation: PACU Anesthesia Type: General Level of consciousness: awake and alert Pain management: pain level controlled Vital Signs Assessment: post-procedure vital signs reviewed and stable Respiratory status: spontaneous breathing, nonlabored ventilation and respiratory function stable Cardiovascular status: stable and blood pressure returned to baseline Anesthetic complications: no   No notable events documented.  Last Vitals:  Vitals:   06/24/22 1000 06/24/22 1030  BP: 129/73 136/72  Pulse: (!) 105 86  Resp: 18 17  Temp:    SpO2: 98% 96%    Last Pain:  Vitals:   06/24/22 0717  TempSrc: Oral                 Beryle Lathe

## 2022-06-24 NOTE — H&P (Signed)
I have kidney stones.  HPI: Rhonda Davies is a 31 year-old female established patient who is here for renal calculi.  05/30/2022: Rhonda Davies is a 31 year old female who underwent left-sided ESWL for a 10 mm calculus 3 weeks ago. Since lithotripsy she denies passage of stone material. She continues to have left-sided back pain and discomfort associated with intermittent nausea. She denies any voiding complaints. She denies fevers, chills. She denies gross hematuria.   Rhonda Davies is a 31 yo female who had a recent KUB that showed a 1cm calcification overlying the left renal pelvis. She has been having some pain since 04/19/22 and was treated for a UTI but she had persistent pain that became more severe and she was [facility] and had the KUB on 05/06/22. She is still having some pain today. She had labs on 5/11 and her Cr was mildly elevated at 1.3. Her cultures on 4/25 and 5/11 had Mx species. She has not had prior stones. She is on topomax for several months.     ALLERGIES: Benzotate Covid19    MEDICATIONS: Tamsulosin Hcl 0.4 mg capsule  Buspirone Hcl  Flonase Allergy Relief  Imitrex  Lexapro 20 mg tablet  Losartan Potassium  Ondansetron Hcl  Propranolol Hcl  Topiramate  Wellbutrin Sr     GU PSH: ESWL - 05/16/2022       PSH Notes: ?thyroglossal duct cyst removal.   NON-GU PSH: Remove Adenoids Tonsillectomy     GU PMH: Flank Pain - 05/16/2022 Renal calculus, She has a 10x50m LUPJ stone with obstruction and smaller LLP stones. I have discussed the options including MET, ESWL and URS and she would like to proceed with ESWL today. I reviewed the risks of ESWL including bleeding, infection, injury to the kidney or adjacent structures, failure to fragment the stone, need for ancillary procedures, thrombotic events, cardiac arrhythmias and sedation complications. Her UA has some abnormalities but she has had 2 cultures in the last month and both were Mx species. The UA is most consistent  with a contaminant. - 05/16/2022 Ureteral obstruction secondary to calculous - 05/16/2022    NON-GU PMH: Anxiety Depression Hypertension    FAMILY HISTORY: Heart Attack - Father High Blood Pressure - Father Kidney Stones - Father   SOCIAL HISTORY: Marital Status: Single Preferred Language: English; Ethnicity: Not Hispanic Or Latino; Race: White Current Smoking Status: Patient has never smoked.   Tobacco Use Assessment Completed: Used Tobacco in last 30 days? Does not use smokeless tobacco. Does not use drugs. Does not drink caffeine.    REVIEW OF SYSTEMS:    GU Review Female:   Patient reports get up at night to urinate. Patient denies frequent urination, hard to postpone urination, burning /pain with urination, leakage of urine, stream starts and stops, trouble starting your stream, have to strain to urinate, and being pregnant.  Gastrointestinal (Upper):   Patient reports nausea. Patient denies vomiting and indigestion/ heartburn.  Gastrointestinal (Lower):   Patient denies constipation and diarrhea.  Constitutional:   Patient denies fever, night sweats, weight loss, and fatigue.  Skin:   Patient denies skin rash/ lesion and itching.  Eyes:   Patient denies blurred vision and double vision.  Ears/ Nose/ Throat:   Patient denies sore throat and sinus problems.  Hematologic/Lymphatic:   Patient denies swollen glands and easy bruising.  Cardiovascular:   Patient denies leg swelling and chest pains.  Respiratory:   Patient denies cough and shortness of breath.  Endocrine:   Patient  denies excessive thirst.  Musculoskeletal:   Patient reports back pain. Patient denies joint pain.  Neurological:   Patient denies headaches and dizziness.  Psychologic:   Patient denies depression and anxiety.   VITAL SIGNS:      05/30/2022 03:44 PM  BP 136/81 mmHg  Pulse 97 /min  Temperature 98.6 F / 37 C   GU PHYSICAL EXAMINATION:      Notes: Left flank tenderness with palpation    MULTI-SYSTEM PHYSICAL EXAMINATION:    Constitutional: Well-nourished. No physical deformities. Normally developed. Good grooming.  Respiratory: No labored breathing, no use of accessory muscles.   Cardiovascular: Normal temperature, normal extremity pulses, no swelling, no varicosities.  Skin: No paleness, no jaundice, no cyanosis. No lesion, no ulcer, no rash.  Neurologic / Psychiatric: Oriented to time, oriented to place, oriented to person. No depression, no anxiety, no agitation.  Gastrointestinal: No mass, no tenderness, no rigidity, non obese abdomen.     Complexity of Data:  Source Of History:  Patient  Records Review:   Previous Doctor Records, Previous Patient Records  Urine Test Review:   Urinalysis  X-Ray Review: KUB: Reviewed Films. Reviewed Report. Discussed With Patient.     05/30/22  Urinalysis  Urine Appearance Cloudy   Urine Color Yellow   Urine Glucose Neg mg/dL  Urine Bilirubin Neg mg/dL  Urine Ketones Neg mg/dL  Urine Specific Gravity 1.010   Urine Blood Neg ery/uL  Urine pH 7.5   Urine Protein Trace mg/dL  Urine Urobilinogen 0.2 mg/dL  Urine Nitrites Neg   Urine Leukocyte Esterase 1+ leu/uL  Urine WBC/hpf 0 - 5/hpf   Urine RBC/hpf NS (Not Seen)   Urine Epithelial Cells 0 - 5/hpf   Urine Bacteria Few (10-25/hpf)   Urine Mucous Not Present   Urine Yeast NS (Not Seen)   Urine Trichomonas Not Present   Urine Cystals Amorph Phosphates   Urine Casts NS (Not Seen)   Urine Sperm Not Present    PROCEDURES:         KUB - 35329  A single view of the abdomen is obtained. Renal shadows are visualized. Just outside of the left renal shadow at the level of L2-L3 there is a 6 mm opacity noted. Tracing along the expected anatomical course of the left ureter there are no other appreciable opacities. There is an overlying prominent bowel gas pattern.      Patient confirmed No Neulasta OnPro Device.           Urinalysis w/Scope Dipstick Dipstick Cont'd Micro   Color: Yellow Bilirubin: Neg mg/dL WBC/hpf: 0 - 5/hpf  Appearance: Cloudy Ketones: Neg mg/dL RBC/hpf: NS (Not Seen)  Specific Gravity: 1.010 Blood: Neg ery/uL Bacteria: Few (10-25/hpf)  pH: 7.5 Protein: Trace mg/dL Cystals: Amorph Phosphates  Glucose: Neg mg/dL Urobilinogen: 0.2 mg/dL Casts: NS (Not Seen)    Nitrites: Neg Trichomonas: Not Present    Leukocyte Esterase: 1+ leu/uL Mucous: Not Present      Epithelial Cells: 0 - 5/hpf      Yeast: NS (Not Seen)      Sperm: Not Present    ASSESSMENT:      ICD-10 Details  1 GU:   Renal calculus - N20.0 Left, Chronic, Exacerbation  2   Ureteral obstruction secondary to calculous - N13.2 Left, Acute, Uncomplicated   PLAN:            Medications New Meds: Hydrocodone-Acetaminophen 5 mg-325 mg tablet 1 tablet PO Q 6 H PRN Kidney stone pain  #  15  0 Refill(s)  Ondansetron Odt 4 mg tablet,disintegrating 1 tablet PO Q 6 H PRN   #20  0 Refill(s)  Pharmacy Name:  [id number]  Address:  [address] [place], [place] [id number]  Phone:  [phone]  Fax:  [phone]            Orders Labs CULTURE, URINE          Schedule Return Visit/Planned Activity: Next Available Appointment - Schedule Surgery          Document Letter(s):  Created for Patient: Clinical Summary         Notes:   Urinalysis is without infectious parameters. I will send for precautionary culture. Definitive stone intervention was discussed including lithotripsy and ureteroscopy. Urinalysis will be sent for precautionary culture today. Stone intervention was discussed in detail today. For ureteroscopy, the patient understands that there is a chance for a staged procedure. Patient also understands that there is risk for bleeding, infection, injury to surrounding organs, and general risks of anesthesia. The patient also understands the placement of a stent and the risks of stent placement including, risk for infection, the risk for pain, and the risk  for injury. For ESWL, the patient understands that there is a chance of failure of procedure, there is also a risk for bruising, infection, bleeding, and injury to surrounding structures. The patient verbalized understanding to these risks.   She would like to proceed with ureteroscopy. Pain and nausea medication were sent into her pharmacy. Green sheet was placed today with the scheduler.

## 2022-06-24 NOTE — Interval H&P Note (Signed)
History and Physical Interval Note:  No change in left UPJ stone.  There are small fragments in the LLP as well.   06/24/2022 8:44 AM  Rhonda Davies  has presented today for surgery, with the diagnosis of LEFT PROXIMAL CALCULUS.  The various methods of treatment have been discussed with the patient and family. After consideration of risks, benefits and other options for treatment, the patient has consented to  Procedure(s) with comments: CYSTOSCOPY/LEFT URETEROSCOPY/HOLMIUM LASER/ LEFT RETROGRADE PYELOGRAM/ LEFT STENT PLACEMENT (Left) - 60 MINUTES as a surgical intervention.  The patient's history has been reviewed, patient examined, no change in status, stable for surgery.  I have reviewed the patient's chart and labs.  Questions were answered to the patient's satisfaction.     [name]

## 2022-06-24 NOTE — Op Note (Signed)
Procedure: 1.  Cystoscopy with the left retrograde pyelogram and interpretation. 2.  Left ureteroscopy with holmium laser lithotripsy, stone extraction and insertion of left double-J stent. 3.  Application of fluoroscopy.  Preop diagnosis: Left UPJ stone and left renal stones.  Postop diagnosis: Same.  Surgeon: Dr. [name].  Anesthesia: General.  Specimen: Stone fragments.  Drains: 6 French by 24 cm left contour double-J stent without tether.  EBL: None.  Complications: None.  Indications: The patient is a 31 year old female who has an 8 mm left UPJ stone that was originally treated with lithotripsy with only minimal fragmentation and no stone passage.  She returns now for ureteroscopic management.  Procedure: She was taken operating room where she was given 2 g of Ancef.  A general anesthetic was induced.  She was placed in lithotomy position and fitted with PAS hose.  Her perineum and genitalia were prepped with Betadine solution she was draped in usual sterile fashion.  Cystoscopy was performed using a 21 [place] scope and 30 degree lens.  Examination revealed a normal urethra.  The bladder wall was smooth and pale without tumors, stones or inflammation.  Ureteral orifices were unremarkable.  The left ureteral orifice was cannulated with a 5 [place] open-ended catheter and contrast was instilled.  The left retrograde pyelogram revealed a normal ureter up to a filling defect at the UPJ consistent with her known stone.  No contrast flowed by the stone.  A sensor wire was then advanced through the open-ended catheter to the level of the stone and initially would not bypass the stone but after advancing the ureteral catheter over the wire to just below the stone and manipulating the wire it eventually entered the collecting system.  The ureteral catheter and cystoscope were removed and a 11/13 French 36 cm digital access sheath inner core was then passed over the wire to the level of  the stone without difficulty.  The assembled sheath was then passed.  The inner core and wire were then removed.  The single-lumen digital flexible scope was then advanced through the sheath and I noted some inflammatory edema at the site of the prior stone location but it had been pushed back into the renal pelvis.  A 200 m tract tip laser fiber was passed and the laser was set on 0.5 J and 40 Hz on the left pedal and 1 J and 20 Hz on the right pedal.  The stone was then fragmented into manageable fragments.  There were also some stones in the lower pole that were fragmented as well.  An engage basket was then used to remove all significant fragments leaving only sand and grit.  The ureteroscope was then removed and a sensor wire was then replaced to the kidney through the sheath.  The sheath was removed and the cystoscope was replaced over the wire.  A 6 French by 24 cm contour double-J stent without tether was advanced the kidney under fluoroscopic guidance.  The wire was removed, leaving good coil in the kidney and a good coil in the bladder.  I elected to not use a tether because of the presence of the inflammatory edema and the need to keep the stent for more than just a few days.  The bladder was drained and the cystoscope was removed.  She was taken down from lithotomy position, her anesthetic was reversed and she was moved to recovery in stable condition.  There were no complications.

## 2022-06-24 NOTE — Discharge Instructions (Addendum)
We will remove the stent by looking in your bladder at f/u on 7/7.   Post Anesthesia Home Care Instructions  Activity: Get plenty of rest for the remainder of the day. A responsible adult should stay with you for 24 hours following the procedure.  For the next 24 hours, DO NOT: -Drive a car -Advertising copywriter -Drink alcoholic beverages -Take any medication unless instructed by your physician -Make any legal decisions or sign important papers.  Meals: Start with liquid foods such as gelatin or soup. Progress to regular foods as tolerated. Avoid greasy, spicy, heavy foods. If nausea and/or vomiting occur, drink only clear liquids until the nausea and/or vomiting subsides. Call your physician if vomiting continues.  Special Instructions/Symptoms: Your throat may feel dry or sore from the anesthesia or the breathing tube placed in your throat during surgery. If this causes discomfort, gargle with warm salt water. The discomfort should disappear within 24 hours.

## 2022-06-24 NOTE — Transfer of Care (Signed)
Immediate Anesthesia Transfer of Care Note  Patient: SANDRIA MCENROE  Procedure(s) Performed: CYSTOSCOPY/LEFT URETEROSCOPY/HOLMIUM LASER/ LEFT RETROGRADE PYELOGRAM/STONE EXTRACTION/  LEFT STENT PLACEMENT (Left: Ureter)  Patient Location: PACU  Anesthesia Type:General  Level of Consciousness: awake and patient cooperative  Airway & Oxygen Therapy: Patient Spontanous Breathing  Post-op Assessment: Report given to RN and Post -op Vital signs reviewed and stable  Post vital signs: Reviewed and stable  Last Vitals:  Vitals Value Taken Time  BP 124/68 06/24/22 0947  Temp 36.9 C 06/24/22 0947  Pulse 121 06/24/22 0947  Resp 20 06/24/22 0947  SpO2 96 % 06/24/22 0947  Vitals shown include unvalidated device data.  Last Pain:  Vitals:   06/24/22 0717  TempSrc: Oral      Patients Stated Pain Goal: 8 (06/24/22 0717)  Complications: No notable events documented.

## 2022-06-29 ENCOUNTER — Encounter (HOSPITAL_BASED_OUTPATIENT_CLINIC_OR_DEPARTMENT_OTHER): Payer: Self-pay | Admitting: Urology

## 2022-08-15 ENCOUNTER — Other Ambulatory Visit: Payer: Self-pay | Admitting: Physician Assistant

## 2022-08-15 DIAGNOSIS — G4489 Other headache syndrome: Secondary | ICD-10-CM

## 2022-08-30 ENCOUNTER — Inpatient Hospital Stay: Admission: RE | Admit: 2022-08-30 | Payer: Managed Care, Other (non HMO) | Source: Ambulatory Visit

## 2022-08-31 ENCOUNTER — Inpatient Hospital Stay: Admission: RE | Admit: 2022-08-31 | Payer: Managed Care, Other (non HMO) | Source: Ambulatory Visit

## 2022-09-09 ENCOUNTER — Ambulatory Visit
Admission: RE | Admit: 2022-09-09 | Discharge: 2022-09-09 | Disposition: A | Discharge status: Home / Self Care | Payer: Managed Care, Other (non HMO) | Source: Ambulatory Visit | Attending: Physician Assistant | Admitting: Physician Assistant

## 2022-09-09 DIAGNOSIS — G4489 Other headache syndrome: Secondary | ICD-10-CM

## 2023-02-02 ENCOUNTER — Encounter: Payer: Self-pay | Admitting: Obstetrics and Gynecology

## 2023-02-20 NOTE — Telephone Encounter (Signed)
Spoke with patient. Advised her of Dr. Amalia Hailey response and she has stated she will reach back out to her neurologist.

## 2023-05-16 ENCOUNTER — Other Ambulatory Visit (HOSPITAL_COMMUNITY)
Admission: RE | Admit: 2023-05-16 | Discharge: 2023-05-16 | Disposition: A | Discharge status: Home / Self Care | Payer: Managed Care, Other (non HMO) | Source: Ambulatory Visit | Attending: Obstetrics and Gynecology | Admitting: Obstetrics and Gynecology

## 2023-05-16 ENCOUNTER — Encounter: Payer: Self-pay | Admitting: Obstetrics and Gynecology

## 2023-05-16 ENCOUNTER — Ambulatory Visit (INDEPENDENT_AMBULATORY_CARE_PROVIDER_SITE_OTHER): Payer: Managed Care, Other (non HMO) | Admitting: Obstetrics and Gynecology

## 2023-05-16 VITALS — BP 133/86 | HR 89 | Ht 63.0 in | Wt 281.5 lb

## 2023-05-16 DIAGNOSIS — N870 Mild cervical dysplasia: Secondary | ICD-10-CM | POA: Diagnosis present

## 2023-05-16 DIAGNOSIS — Z124 Encounter for screening for malignant neoplasm of cervix: Secondary | ICD-10-CM | POA: Diagnosis present

## 2023-05-16 DIAGNOSIS — Z01419 Encounter for gynecological examination (general) (routine) without abnormal findings: Secondary | ICD-10-CM

## 2023-05-16 NOTE — Progress Notes (Signed)
HPI:      [name] is a 32 y.o. [id number] who LMP was Patient's last menstrual period was 05/11/2023 (exact date).  Subjective:   She presents today for her annual examination.  She continues to get occasional migraines.  She is not using anything for birth control other than rhythm method.  She has possible plans to get pregnant after October.  She has normal regular menstrual cycles.  She is not interested in birth control at this time. She has a history of CIN-1 and this is her first Pap after that diagnosis.    Hx: The following portions of the patient's history were reviewed and updated as appropriate:             She  has a past medical history of GAD (generalized anxiety disorder), H/O chlamydia infection (01/27/2016), H/O trichomoniasis (12/14/2016), History of cervical dysplasia (03/2021), History of COVID-19 (12/2021), History of pregnancy induced hypertension, Hypertension, Left ureteral calculus, MDD (major depressive disorder), Migraines, and Wears glasses. She does not have any pertinent problems on file. She  has a past surgical history that includes Tonsillectomy and adenoidectomy (2007); Extracorporeal shock wave lithotripsy (Left, 05/16/2022); Cyst excision (01/17/2012); and Cystoscopy/ureteroscopy/holmium laser/stent placement (Left, 06/24/2022). Her family history includes Aneurysm in her paternal uncle; Anxiety disorder in her brother, father, and paternal aunt; Bipolar disorder in her paternal aunt; Diabetes in her paternal aunt and paternal grandmother; Heart disease in her father, maternal grandfather, and paternal grandfather. She  reports that she has never smoked. She has never used smokeless tobacco. She reports current alcohol use. She reports that she does not use drugs. She has a current medication list which includes the following prescription(s): buspirone, cyanocobalamin, ondansetron, ubrelvy, venlafaxine, vitamin d, bupropion, fluticasone, losartan,  propranolol, and topiramate. She is allergic to benzonatate and covid-19 mrna vaccine (pfizer) [covid-19 mrna vacc (moderna)].       Review of Systems:  Review of Systems  Constitutional: Denied constitutional symptoms, night sweats, recent illness, fatigue, fever, insomnia and weight loss.  Eyes: Denied eye symptoms, eye pain, photophobia, vision change and visual disturbance.  Ears/Nose/Throat/Neck: Denied ear, nose, throat or neck symptoms, hearing loss, nasal discharge, sinus congestion and sore throat.  Cardiovascular: Denied cardiovascular symptoms, arrhythmia, chest pain/pressure, edema, exercise intolerance, orthopnea and palpitations.  Respiratory: Denied pulmonary symptoms, asthma, pleuritic pain, productive sputum, cough, dyspnea and wheezing.  Gastrointestinal: Denied, gastro-esophageal reflux, melena, nausea and vomiting.  Genitourinary: Denied genitourinary symptoms including symptomatic vaginal discharge, pelvic relaxation issues, and urinary complaints.  Musculoskeletal: Denied musculoskeletal symptoms, stiffness, swelling, muscle weakness and myalgia.  Dermatologic: Denied dermatology symptoms, rash and scar.  Neurologic: Denied neurology symptoms, dizziness, headache, neck pain and syncope.  Psychiatric: Denied psychiatric symptoms, anxiety and depression.  Endocrine: Denied endocrine symptoms including hot flashes and night sweats.   Meds:   Current Outpatient Medications on File Prior to Visit  Medication Sig Dispense Refill   busPIRone (BUSPAR) 5 MG tablet Take 1-2 tablets (5-10 mg total) by mouth 2 (two) times daily as needed (for anxiety/stress/agitation sx). (Patient taking differently: Take 5-10 mg by mouth 2 (two) times daily as needed (for anxiety/stress/agitation sx).) 60 tablet 2   Cyanocobalamin (VITAMIN B12 PO) Take 1 tablet by mouth daily.     ondansetron (ZOFRAN) 4 MG tablet Take 4 mg by mouth every 8 (eight) hours as needed for nausea or vomiting.      Ubrogepant (UBRELVY) 100 MG TABS Take by mouth as needed.     venlafaxine (EFFEXOR) 37.5 MG  tablet Take 37.5 mg by mouth 2 (two) times daily.     VITAMIN D PO Take 5,000 Int'l Units/day by mouth daily. D2     buPROPion (WELLBUTRIN XL) 300 MG 24 hr tablet Take 300 mg by mouth daily.     fluticasone (FLONASE) 50 MCG/ACT nasal spray Place 1 spray into both nostrils daily as needed.     losartan (COZAAR) 50 MG tablet Take 1 tablet by mouth daily.     propranolol (INDERAL) 20 MG tablet Take 20 mg by mouth daily.     topiramate (TOPAMAX) 100 MG tablet Take 100 mg by mouth daily.     No current facility-administered medications on file prior to visit.     Objective:     Vitals:   05/16/23 0816  BP: 133/86  Pulse: 89    Filed Weights   05/16/23 0816  Weight: 281 lb 8 oz (127.7 kg)              Physical examination General NAD, Conversant  HEENT Atraumatic; Op clear with mmm.  Normo-cephalic. Pupils reactive. Anicteric sclerae  Thyroid/Neck Smooth without nodularity or enlargement. Normal ROM.  Neck Supple.  Skin No rashes, lesions or ulceration. Normal palpated skin turgor. No nodularity.  Breasts: No masses or discharge.  Symmetric.  No axillary adenopathy.  Lungs: Clear to auscultation.No rales or wheezes. Normal Respiratory effort, no retractions.  Heart: NSR.  No murmurs or rubs appreciated. No peripheral edema  Abdomen: Soft.  Non-tender.  No masses.  No HSM. No hernia  Extremities: Moves all appropriately.  Normal ROM for age. No lymphadenopathy.  Neuro: Oriented to PPT.  Normal mood. Normal affect.     Pelvic:   Vulva: Normal appearance.  No lesions.  Vagina: No lesions or abnormalities noted.  Support: Normal pelvic support.  Urethra No masses tenderness or scarring.  Meatus Normal size without lesions or prolapse.  Cervix: Normal appearance.  No lesions.  Anus: Normal exam.  No lesions.  Perineum: Normal exam.  No lesions.        Bimanual   Uterus: Normal size.   Non-tender.  Mobile.  AV.  Adnexae: No masses.  Non-tender to palpation.  Cul-de-sac: Negative for abnormality.     Assessment:    G2P2002 Patient Active Problem List   Diagnosis Date Noted   Situational stress 05/27/2020   Blood pressure elevated without history of HTN 05/27/2020   Post-dates pregnancy 02/18/2019   Current mild episode of major depressive disorder without prior episode (HCC) 06/12/2018   Anxiety disorder 04/03/2018   Stress headaches 04/03/2018   Family history of diabetes mellitus 03/20/2018   H/O trichomoniasis 12/14/2016   ASCUS with positive high risk HPV 05/29/2016   Morbid obesity with BMI of 40.0-44.9, adult (HCC) 04/26/2016   H/O chlamydia infection 01/27/2016     1. Well woman exam with routine gynecological exam   2. Cervical cancer screening   3. CIN I (cervical intraepithelial neoplasia I)   4. Morbid obesity with BMI of 45.0-49.9, adult (HCC)        Plan:            1.  Basic Screening Recommendations The basic screening recommendations for asymptomatic women were discussed with the patient during her visit.  The age-appropriate recommendations were discussed with her and the rational for the tests reviewed.  When I am informed by the patient that another primary care physician has previously obtained the age-appropriate tests and they are up-to-date, only outstanding tests are ordered  and referrals given as necessary.  Abnormal results of tests will be discussed with her when all of her results are completed.  Routine preventative health maintenance measures emphasized: Exercise/Diet/Weight control, Tobacco Warnings, Alcohol/Substance use risks and Stress Management Pap performed we will call her should she need a follow-up colposcopy. Orders No orders of the defined types were placed in this encounter.   No orders of the defined types were placed in this encounter.         F/U  Return in about 1 year (around 05/15/2024) for Annual Physical,  We will contact her with any abnormal test results.  [name], M.D. 05/16/2023 8:44 AM

## 2023-05-16 NOTE — Progress Notes (Signed)
Patients presents for annual exam today. She states having regular cycles at this time, states she is tracking her cycle for birth control. Patient is due for pap smear, ordered. She states no other questions or concerns at this time.

## 2023-05-24 LAB — CYTOLOGY - PAP
Comment: NEGATIVE
Diagnosis: NEGATIVE
High risk HPV: NEGATIVE

## 2023-07-24 ENCOUNTER — Other Ambulatory Visit: Payer: Self-pay

## 2023-07-24 MED ORDER — WEGOVY 0.5 MG/0.5ML ~~LOC~~ SOAJ
0.5000 mg | SUBCUTANEOUS | 0 refills | Status: DC
  Filled 2023-07-24: qty 2, 28d supply, fill #0

## 2023-08-15 ENCOUNTER — Other Ambulatory Visit: Payer: Self-pay

## 2023-08-15 MED ORDER — WEGOVY 1 MG/0.5ML ~~LOC~~ SOAJ
1.0000 mg | SUBCUTANEOUS | 0 refills | Status: DC
  Filled 2023-08-15: qty 2, 28d supply, fill #0

## 2023-09-10 IMAGING — CR DG ABDOMEN 1V
2 series · 2 of 2 positions shown · non-contrast
Comparison: 03/16/2022.

CLINICAL DATA: Lithotripsy.

EXAM:
ABDOMEN - 1 VIEW

[t abdomen (1 of 2)]
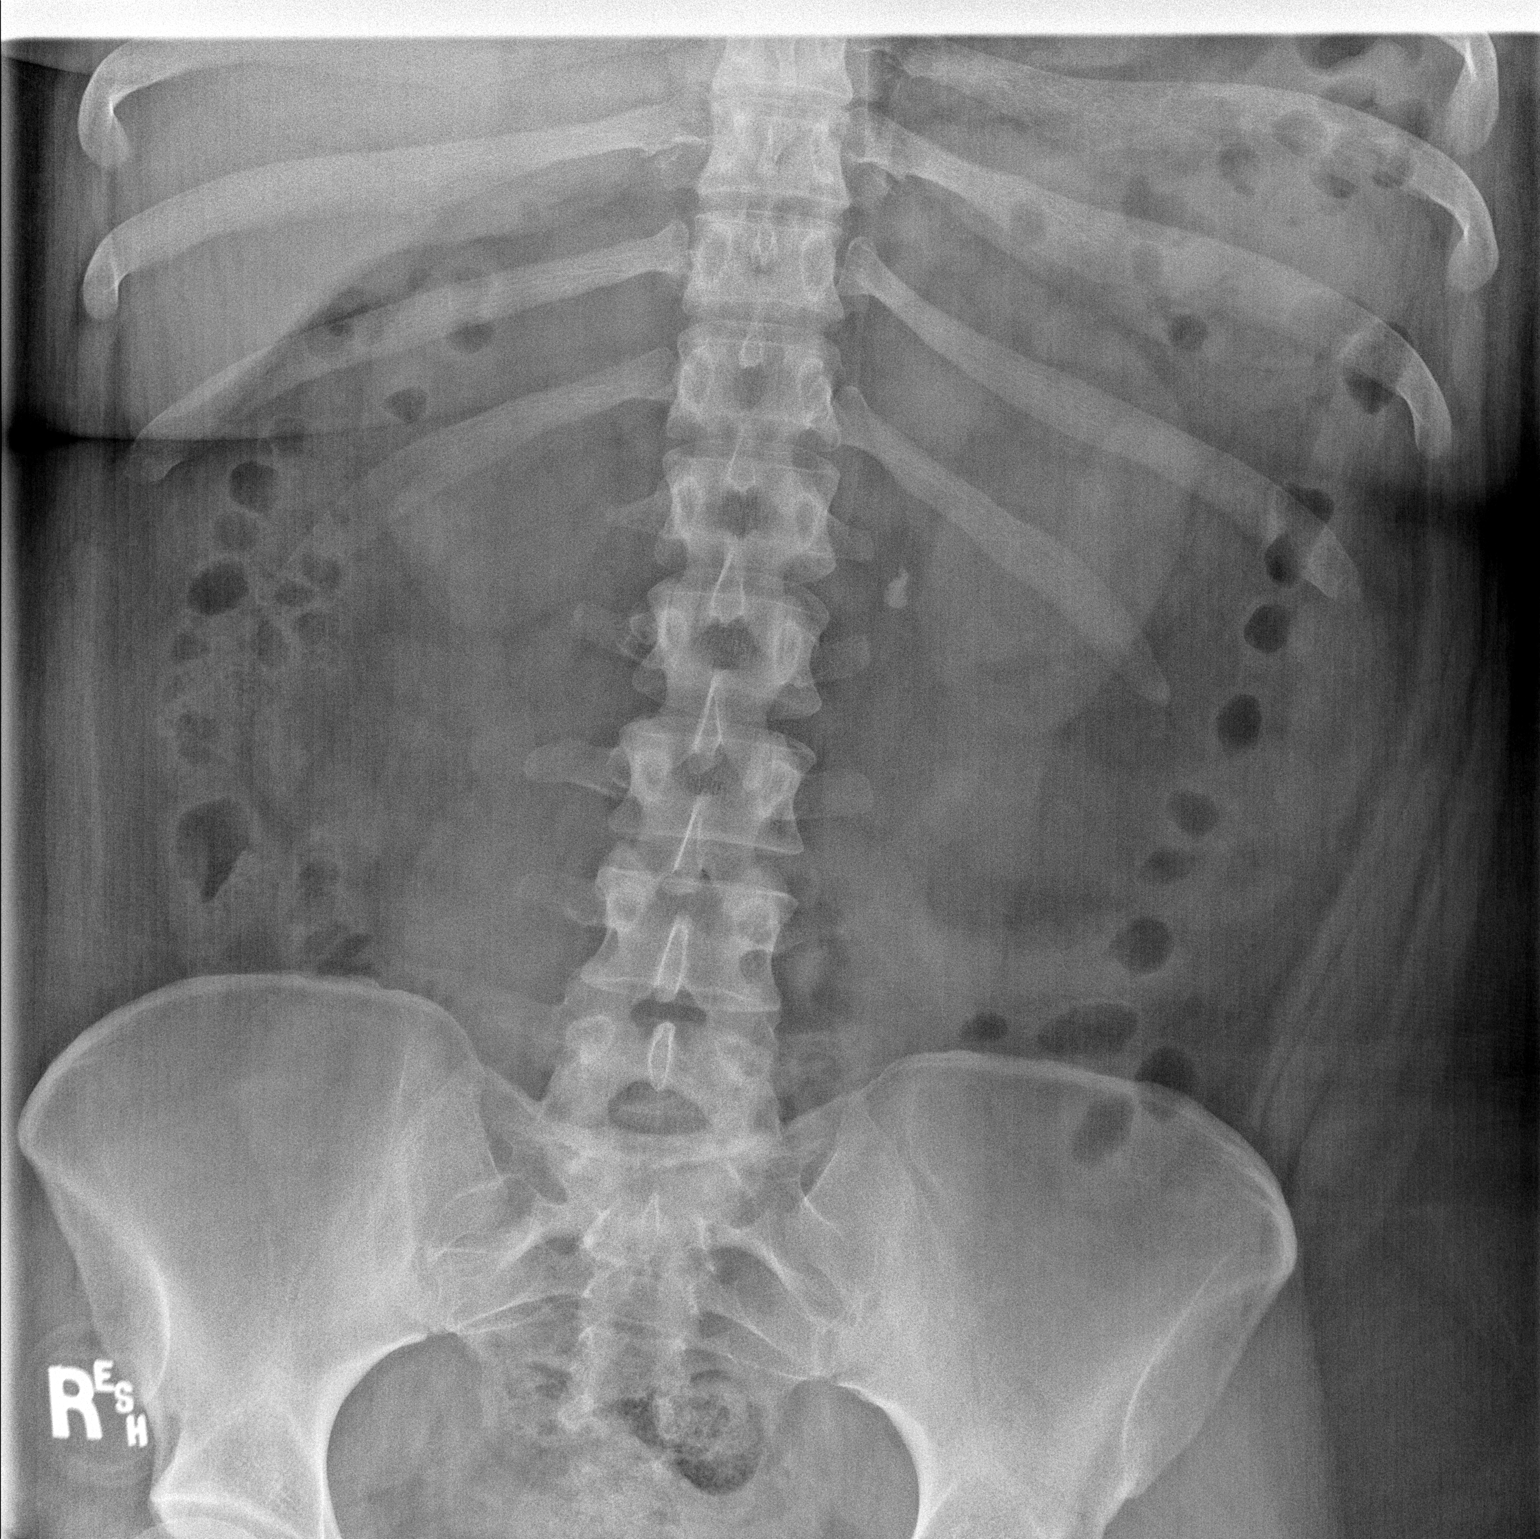

[t abdomen (2 of 2)]
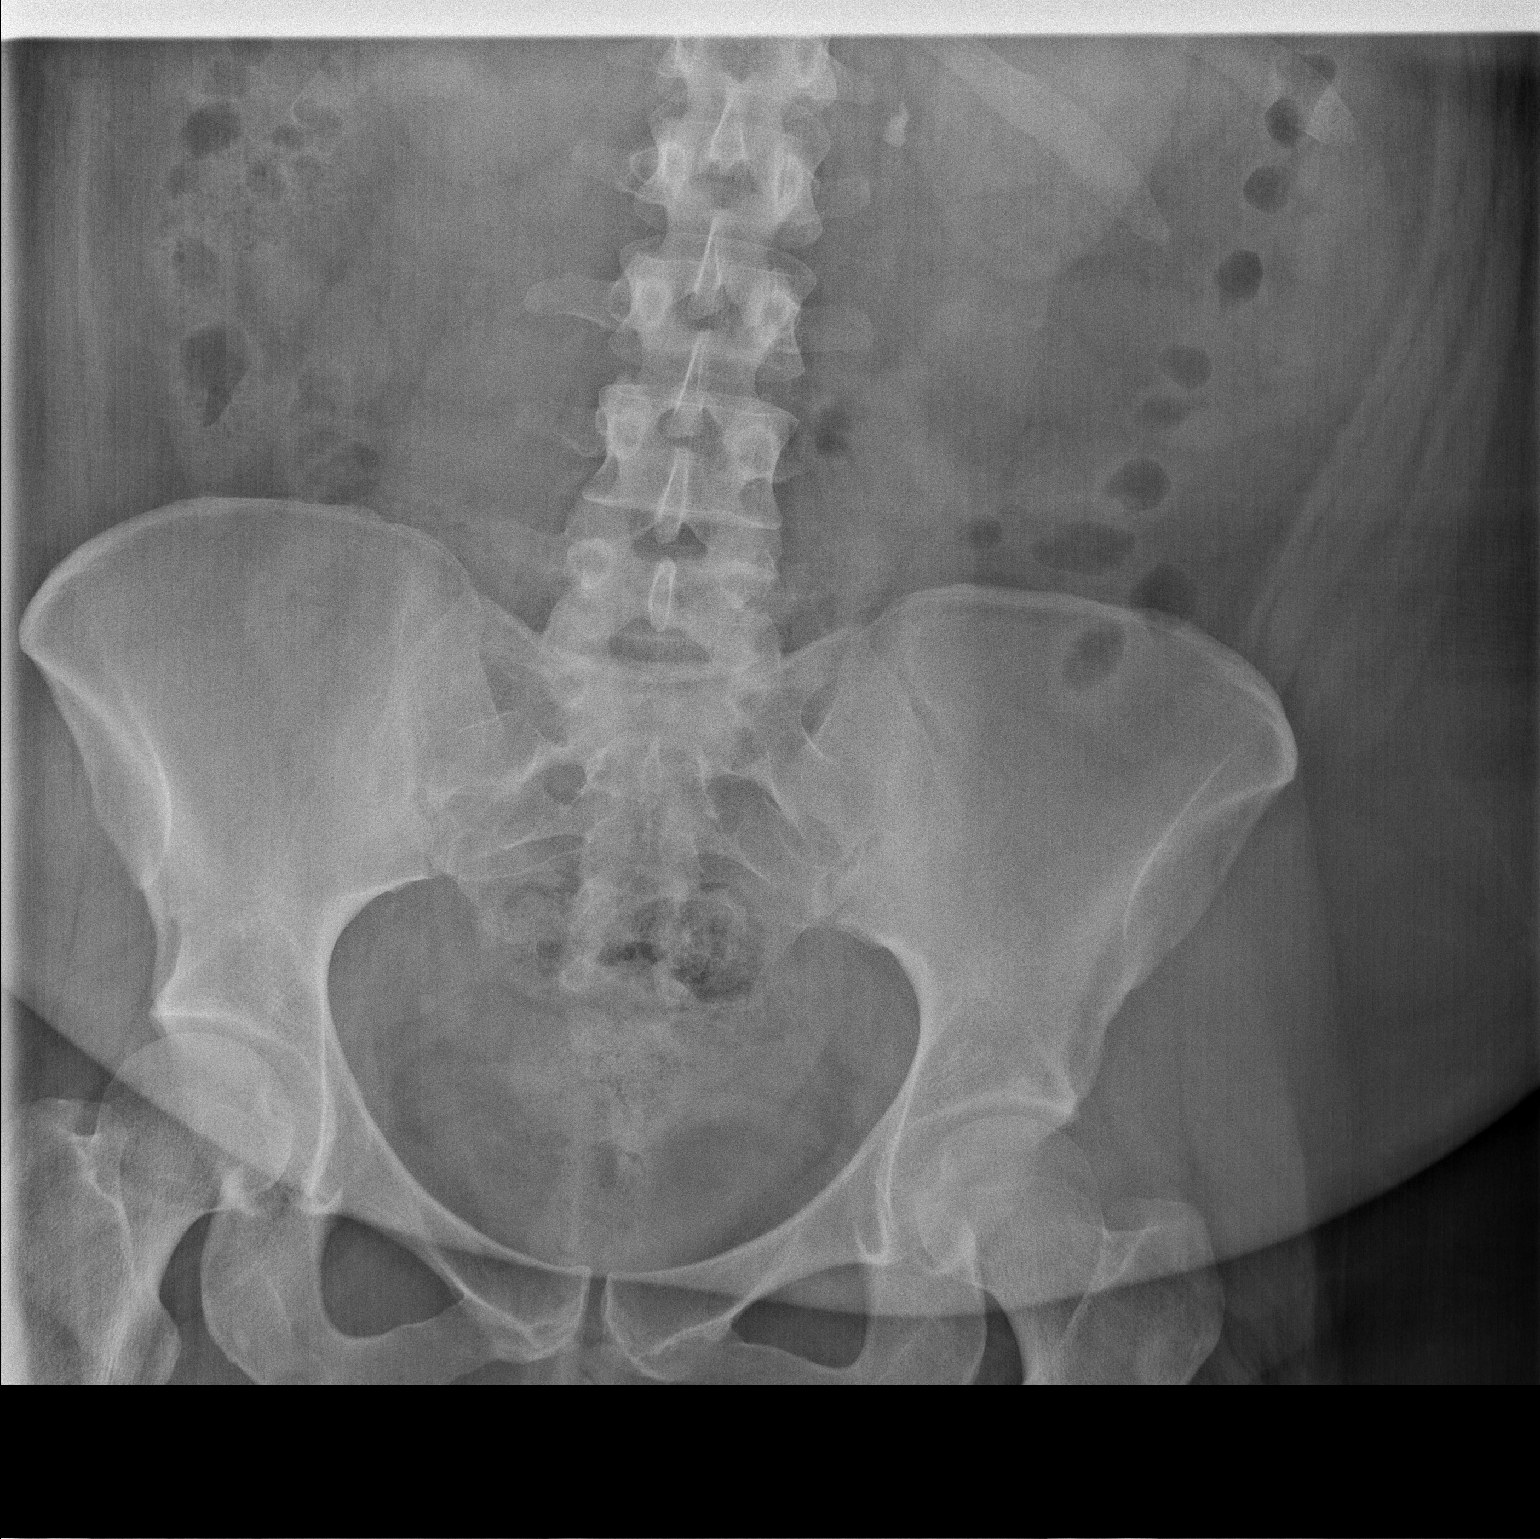

[2 of 2 positions shown; findings below may reference images not displayed]

FINDINGS: The bowel gas pattern is normal. There is a 1 cm calculus in the
region the left renal pelvis, unchanged from the prior exam. No
renal calculus is seen bilaterally. No acute osseous abnormality.
IMPRESSION: 1 cm calculus in the region of the proximal left ureter on the left.

## 2023-09-11 ENCOUNTER — Other Ambulatory Visit: Payer: Self-pay

## 2023-09-11 MED ORDER — WEGOVY 1.7 MG/0.75ML ~~LOC~~ SOAJ
1.7000 mg | SUBCUTANEOUS | 0 refills | Status: DC
  Filled 2023-09-11: qty 3, 28d supply, fill #0

## 2023-10-06 ENCOUNTER — Other Ambulatory Visit: Payer: Self-pay

## 2023-10-06 MED ORDER — WEGOVY 2.4 MG/0.75ML ~~LOC~~ SOAJ
2.4000 mg | SUBCUTANEOUS | 2 refills | Status: DC
  Filled 2023-10-06: qty 3, 28d supply, fill #0

## 2023-10-18 ENCOUNTER — Other Ambulatory Visit: Payer: Self-pay

## 2023-10-18 MED ORDER — WEGOVY 2.4 MG/0.75ML ~~LOC~~ SOAJ
2.4000 mg | SUBCUTANEOUS | 2 refills | Status: DC
  Filled 2023-10-18: qty 3, 28d supply, fill #0

## 2023-10-19 ENCOUNTER — Other Ambulatory Visit: Payer: Self-pay

## 2023-10-24 ENCOUNTER — Other Ambulatory Visit: Payer: Self-pay

## 2023-12-27 NOTE — L&D Delivery Note (Signed)
 Delivery Note At 8:26 PM a viable female was delivered via Vaginal, Spontaneous (Presentation: Left Occiput Anterior).  APGAR:9/10  , ; weight  .rapid second stage delivery py CNM Wilson    DR schemerhorn Leonides Minder delivered  Placenta statusintact 8 minutes  after: Spontaneous, Intact.  Cord:   with the following complications: none  .  Cord pH: n/a No vaginal lacerations . Good uterine tone  Anesthesia: Epidural Episiotomy:  none  Lacerations:  none  Suture Repair: n/a Est. Blood Loss (mL):  400  cc  Mom to postpartum.  Baby to Couplet care / Skin to Skin.  Debby PARAS Jalayia Bagheri 06/21/2024, 8:43 PM

## 2024-01-05 LAB — OB RESULTS CONSOLE RUBELLA ANTIBODY, IGM: Rubella: IMMUNE

## 2024-01-05 LAB — OB RESULTS CONSOLE HEPATITIS B SURFACE ANTIGEN: Hepatitis B Surface Ag: NEGATIVE

## 2024-01-05 LAB — OB RESULTS CONSOLE VARICELLA ZOSTER ANTIBODY, IGG: Varicella: NON-IMMUNE/NOT IMMUNE

## 2024-01-16 ENCOUNTER — Other Ambulatory Visit: Payer: Self-pay | Admitting: Obstetrics and Gynecology

## 2024-01-16 DIAGNOSIS — O0992 Supervision of high risk pregnancy, unspecified, second trimester: Secondary | ICD-10-CM

## 2024-01-16 DIAGNOSIS — O99210 Obesity complicating pregnancy, unspecified trimester: Secondary | ICD-10-CM

## 2024-01-16 DIAGNOSIS — Z363 Encounter for antenatal screening for malformations: Secondary | ICD-10-CM

## 2024-01-27 ENCOUNTER — Other Ambulatory Visit: Payer: Self-pay

## 2024-01-27 DIAGNOSIS — Z20822 Contact with and (suspected) exposure to covid-19: Secondary | ICD-10-CM | POA: Diagnosis not present

## 2024-01-27 DIAGNOSIS — O219 Vomiting of pregnancy, unspecified: Secondary | ICD-10-CM

## 2024-01-27 DIAGNOSIS — I10 Essential (primary) hypertension: Secondary | ICD-10-CM | POA: Insufficient documentation

## 2024-01-27 DIAGNOSIS — Z3A16 16 weeks gestation of pregnancy: Secondary | ICD-10-CM | POA: Insufficient documentation

## 2024-01-27 DIAGNOSIS — K529 Noninfective gastroenteritis and colitis, unspecified: Secondary | ICD-10-CM | POA: Diagnosis not present

## 2024-01-27 DIAGNOSIS — O99612 Diseases of the digestive system complicating pregnancy, second trimester: Secondary | ICD-10-CM | POA: Insufficient documentation

## 2024-01-27 LAB — COMPREHENSIVE METABOLIC PANEL
ALT: 31 U/L (ref 0–44)
AST: 26 U/L (ref 15–41)
Albumin: 3.4 g/dL — ABNORMAL LOW (ref 3.5–5.0)
Alkaline Phosphatase: 61 U/L (ref 38–126)
Anion gap: 13 (ref 5–15)
BUN: 11 mg/dL (ref 6–20)
CO2: 18 mmol/L — ABNORMAL LOW (ref 22–32)
Calcium: 8.7 mg/dL — ABNORMAL LOW (ref 8.9–10.3)
Chloride: 103 mmol/L (ref 98–111)
Creatinine, Ser: 0.62 mg/dL (ref 0.44–1.00)
GFR, Estimated: >60 mL/min (ref >60)
Glucose, Bld: 113 mg/dL — ABNORMAL HIGH (ref 70–99)
Potassium: 3.7 mmol/L (ref 3.5–5.1)
Sodium: 134 mmol/L — ABNORMAL LOW (ref 135–145)
Total Bilirubin: 1.4 mg/dL — ABNORMAL HIGH (ref 0.0–1.2)
Total Protein: 6.6 g/dL (ref 6.5–8.1)

## 2024-01-27 LAB — CBC
HCT: 37 % (ref 36.0–46.0)
Hemoglobin: 12.6 g/dL (ref 12.0–15.0)
MCH: 28.4 pg (ref 26.0–34.0)
MCHC: 34.1 g/dL (ref 30.0–36.0)
MCV: 83.3 fL (ref 80.0–100.0)
Platelets: 240 10*3/uL (ref 150–400)
RBC: 4.44 MIL/uL (ref 3.87–5.11)
RDW: 14.3 % (ref 11.5–15.5)
WBC: 12.2 10*3/uL — ABNORMAL HIGH (ref 4.0–10.5)
nRBC: 0 % (ref 0.0–0.2)

## 2024-01-27 LAB — LIPASE, BLOOD: Lipase: 22 U/L (ref 11–51)

## 2024-01-27 MED ORDER — PANTOPRAZOLE SODIUM 20 MG PO TBEC: 20.0000 mg | DELAYED_RELEASE_TABLET | Freq: Every day | ORAL | 2 refills | Status: DC

## 2024-01-27 MED ORDER — PROMETHAZINE HCL 25 MG PO TABS: 12.5000 mg | ORAL_TABLET | Freq: Four times a day (QID) | ORAL | 0 refills | Status: DC | PRN

## 2024-01-27 NOTE — ED Triage Notes (Signed)
Pt to ed from home via POV for vomiting that started around 11pm. Pt advised her whoel family has the GI bug. Pt is [redacted] weeks pregnant and called her OBGYN and they told her to come here. Pt is caox4, in no acute distress and ambulatory in triage.

## 2024-01-27 NOTE — Progress Notes (Signed)
Called after hours line with concerns about N/V and new onset blood noted in emesis. Reports her family has all had a GI virus and she started getting sick around 2300 last night. She noted dark blood in her most recent emesis and has not been able to keep much down. Currently taking Zofran 4 mg every 8 hours and having multiple emesis episodes despite Zofran.   Discussed options for evaluation tonight and this weekend is the Emergency Department. Recommend evaluation if she is unable to keep liquids down, she feels dehydrated or dizzy, more episodes of hemoptysis, or other signs of dehydration like decreased urine output. Instructed to increase zofran to 8 mg every 8 hours for next 72 hours as needed. Will send in Rx for phenergan and protonix to pharmacy (to pick up in AM). She will go to ED for evaluation tonight if she has continued concerns.   Margaretmary Eddy, CNM Certified Nurse Midwife [place]  [facility]

## 2024-01-28 ENCOUNTER — Emergency Department
Admission: EM | Admit: 2024-01-28 | Discharge: 2024-01-28 | Disposition: A | Discharge status: Home / Self Care | Payer: Managed Care, Other (non HMO) | Attending: Emergency Medicine | Admitting: Emergency Medicine

## 2024-01-28 DIAGNOSIS — A084 Viral intestinal infection, unspecified: Secondary | ICD-10-CM

## 2024-01-28 DIAGNOSIS — K226 Gastro-esophageal laceration-hemorrhage syndrome: Secondary | ICD-10-CM

## 2024-01-28 LAB — RESP PANEL BY RT-PCR (RSV, FLU A&B, COVID)  RVPGX2
Influenza A by PCR: NEGATIVE
Influenza B by PCR: NEGATIVE
Resp Syncytial Virus by PCR: NEGATIVE
SARS Coronavirus 2 by RT PCR: NEGATIVE

## 2024-01-28 LAB — URINALYSIS, ROUTINE W REFLEX MICROSCOPIC
Bacteria, UA: NONE SEEN
Bilirubin Urine: NEGATIVE
Glucose, UA: NEGATIVE mg/dL
Hgb urine dipstick: NEGATIVE
Ketones, ur: 20 mg/dL — AB
Nitrite: NEGATIVE
Protein, ur: 30 mg/dL — AB
Specific Gravity, Urine: 1.03 (ref 1.005–1.030)
pH: 5 (ref 5.0–8.0)

## 2024-01-28 MED ORDER — SODIUM CHLORIDE 0.9 % IV BOLUS
1000.0000 mL | Freq: Once | INTRAVENOUS | Status: AC
Start: 2024-01-28 — End: 2024-01-28
  Administered 2024-01-28: 1000 mL via INTRAVENOUS

## 2024-01-28 MED ORDER — ONDANSETRON HCL 4 MG/2ML IJ SOLN
4.0000 mg | Freq: Once | INTRAMUSCULAR | Status: AC
  Administered 2024-01-28: 4 mg via INTRAVENOUS
  Filled 2024-01-28: qty 2

## 2024-01-28 MED ORDER — ONDANSETRON 4 MG PO TBDP
4.0000 mg | ORAL_TABLET | Freq: Three times a day (TID) | ORAL | 0 refills | Status: DC | PRN
Start: 2024-01-28 — End: 2024-06-23

## 2024-01-28 NOTE — ED Notes (Signed)
Pt ambulated to bedside restroom w steady gate and returned to bed without incident. NAD, CB within reach. No needs further expressed. Husband is at bedside.

## 2024-01-28 NOTE — ED Provider Notes (Signed)
[facility] Provider Note    Event Date/Time   First MD Initiated Contact with Patient 01/28/24 [phone]     (approximate)  History   Chief Complaint: Hematemesis  HPI  Rhonda Davies is a 33 y.o. female with a past history of generalized anxiety, hypertension, [redacted] weeks pregnant who presents to the emergency department for nausea and vomiting.  According to the patient her children all have the "stomach bug."  Patient states starting Friday night/Saturday morning she began with nausea vomiting which has continued until this morning.  Patient states initially it was only vomit however last night she developed some blood streaks in her vomit.  She called the on-call OB nurse who recommended to come to the emergency department for evaluation.  Patient states mild diarrhea.  Physical Exam   Triage Vital Signs: ED Triage Vitals [01/27/24 2327]  Encounter Vitals Group     BP 133/84     Systolic BP Percentile      Diastolic BP Percentile      Pulse Rate (!) 120     Resp 16     Temp 98 F (36.7 C)     Temp Source Oral     SpO2 98 %     Weight 239 lb (108.4 kg)     Height 5\' 3"  (1.6 m)     Head Circumference      Peak Flow      Pain Score 0     Pain Loc      Pain Education      Exclude from Growth Chart     Most recent vital signs: Vitals:   01/28/24 0905 01/28/24 1019  BP: 128/87   Pulse: (!) 104   Resp: 20   Temp:  98.4 F (36.9 C)  SpO2: 100%     General: Awake, no distress.  CV:  Good peripheral perfusion.  Regular rate and rhythm  Resp:  Normal effort.  Equal breath sounds bilaterally.  Abd:  No distention.  Soft, states mild abdominal cramping largely nontender.  Denies any vaginal bleeding or fluid leakage.  ED Results / Procedures / Treatments   MEDICATIONS ORDERED IN ED: Medications  sodium chloride 0.9 % bolus 1,000 mL (1,000 mLs Intravenous New Bag/Given 01/28/24 0913)  ondansetron (ZOFRAN) injection 4 mg (4 mg Intravenous Given  01/28/24 0914)     IMPRESSION / MDM / ASSESSMENT AND PLAN / ED COURSE  I reviewed the triage vital signs and the nursing notes.  Patient's presentation is most consistent with acute presentation with potential threat to life or bodily function.  Patient presents to the emergency department for nausea vomiting diarrhea now with some blood within the vomitus.  Patient's description of vomiting that started off initially now with blood streaks sounds very consistent with Mallory-Weiss syndrome.  Has not had any vomiting in the emergency department.  We will treat with IV Zofran and fluids.  Patient's lab work shows a reassuring CBC, reassuring chemistry a reassuring urinalysis besides mild amount of ketones consistent with dehydration.  Normal LFTs and lipase.  Negative respiratory panel.  Highly suspect gastroenteritis likely norovirus given the recent outbreak.  As long as the patient is feeling better after IV hydration and nausea medication anticipate discharge home with continued nausea medication.  Patient states she has Zofran to take at home.  Patient is feeling much better.  Will prescribe Zofran which the patient has taken during this pregnancy already.  Discussed my typical hygiene and quarantine  discussion given likelihood of norovirus.  Patient agreeable to plan will orally hydrate at home.  FINAL CLINICAL IMPRESSION(S) / ED DIAGNOSES   Nausea vomiting diarrhea Gastroenteritis    Note:  This document was prepared using Dragon voice recognition software and may include unintentional dictation errors.   Minna Antis, MD 01/28/24 1130

## 2024-02-26 ENCOUNTER — Other Ambulatory Visit: Payer: Self-pay | Admitting: Obstetrics

## 2024-02-26 ENCOUNTER — Other Ambulatory Visit: Payer: Self-pay

## 2024-02-26 ENCOUNTER — Ambulatory Visit: Admitting: Obstetrics

## 2024-02-26 ENCOUNTER — Ambulatory Visit: Payer: Managed Care, Other (non HMO) | Attending: Obstetrics

## 2024-02-26 DIAGNOSIS — E669 Obesity, unspecified: Secondary | ICD-10-CM | POA: Diagnosis not present

## 2024-02-26 DIAGNOSIS — Z8759 Personal history of other complications of pregnancy, childbirth and the puerperium: Secondary | ICD-10-CM | POA: Diagnosis not present

## 2024-02-26 DIAGNOSIS — O0992 Supervision of high risk pregnancy, unspecified, second trimester: Secondary | ICD-10-CM

## 2024-02-26 DIAGNOSIS — O09292 Supervision of pregnancy with other poor reproductive or obstetric history, second trimester: Secondary | ICD-10-CM | POA: Insufficient documentation

## 2024-02-26 DIAGNOSIS — O162 Unspecified maternal hypertension, second trimester: Secondary | ICD-10-CM | POA: Diagnosis not present

## 2024-02-26 DIAGNOSIS — Z3A20 20 weeks gestation of pregnancy: Secondary | ICD-10-CM | POA: Insufficient documentation

## 2024-02-26 DIAGNOSIS — O10012 Pre-existing essential hypertension complicating pregnancy, second trimester: Secondary | ICD-10-CM

## 2024-02-26 DIAGNOSIS — O99210 Obesity complicating pregnancy, unspecified trimester: Secondary | ICD-10-CM

## 2024-02-26 DIAGNOSIS — O99212 Obesity complicating pregnancy, second trimester: Secondary | ICD-10-CM | POA: Diagnosis not present

## 2024-02-26 DIAGNOSIS — Z363 Encounter for antenatal screening for malformations: Secondary | ICD-10-CM | POA: Insufficient documentation

## 2024-02-26 DIAGNOSIS — O10912 Unspecified pre-existing hypertension complicating pregnancy, second trimester: Secondary | ICD-10-CM

## 2024-02-26 NOTE — Progress Notes (Signed)
 MFM Consult Note  Rhonda Davies is currently at 20 weeks and 3 days.  She was seen due to maternal obesity with a BMI of 42 and history of chronic hypertension that is not treated with any medications.  Her first pregnancy was complicated by an IUGR baby who weighed only 4 pounds 15 ounces at 39+ weeks.  She denies any problems in her current pregnancy.    She had a cell free DNA test earlier in her pregnancy which indicated a low risk for trisomy 6, 40, and 13. A female fetus is predicted.   She was informed that the fetal growth and amniotic fluid level were appropriate for her gestational age.   There were no obvious fetal anomalies noted on today's ultrasound exam. However, today's exam was limited due to the fetal position.  The patient was informed that anomalies may be missed due to technical limitations. If the fetus is in a suboptimal position or maternal habitus is increased, visualization of the fetus in the maternal uterus may be impaired.  The following were discussed during today's consultation:  Chronic hypertension in pregnancy with history of preeclampsia and IUGR  Her blood pressures should be monitored throughout her pregnancy to determine if any antihypertensive medications are necessary.    Labetalol or nifedipine should be started later in her pregnancy should her blood pressures persistently be higher than 140 over 90s range.    She should start taking a daily baby aspirin (81 mg daily) for preeclampsia prophylaxis.    Due to her history of IUGR and preeclampsia, we will continue to follow her with growth ultrasounds throughout her pregnancy.    As her BMI is greater than 40, weekly fetal testing should be started at around 34 weeks.    However, weekly fetal testing should be started at 32 weeks should she require an antihypertensive medication for management of her blood pressures.  A follow-up exam was scheduled in our office in 4 weeks to assess the fetal  growth and to complete the views of the fetal anatomy.    The patient stated that all of her questions were answered today.  A total of 45 minutes was spent counseling and coordinating the care for this patient.  Greater than 50% of the time was spent in direct face-to-face contact.

## 2024-04-01 ENCOUNTER — Other Ambulatory Visit: Payer: Self-pay | Admitting: Obstetrics

## 2024-04-01 ENCOUNTER — Other Ambulatory Visit: Payer: Self-pay

## 2024-04-01 ENCOUNTER — Ambulatory Visit: Attending: Obstetrics | Admitting: Obstetrics

## 2024-04-01 ENCOUNTER — Ambulatory Visit: Attending: Obstetrics

## 2024-04-01 DIAGNOSIS — E669 Obesity, unspecified: Secondary | ICD-10-CM

## 2024-04-01 DIAGNOSIS — Z3A25 25 weeks gestation of pregnancy: Secondary | ICD-10-CM | POA: Diagnosis not present

## 2024-04-01 DIAGNOSIS — O10012 Pre-existing essential hypertension complicating pregnancy, second trimester: Secondary | ICD-10-CM | POA: Insufficient documentation

## 2024-04-01 DIAGNOSIS — R030 Elevated blood-pressure reading, without diagnosis of hypertension: Secondary | ICD-10-CM

## 2024-04-01 DIAGNOSIS — O10919 Unspecified pre-existing hypertension complicating pregnancy, unspecified trimester: Secondary | ICD-10-CM

## 2024-04-01 DIAGNOSIS — O355XX0 Maternal care for (suspected) damage to fetus by drugs, not applicable or unspecified: Secondary | ICD-10-CM | POA: Diagnosis not present

## 2024-04-01 DIAGNOSIS — O09292 Supervision of pregnancy with other poor reproductive or obstetric history, second trimester: Secondary | ICD-10-CM

## 2024-04-01 DIAGNOSIS — O10912 Unspecified pre-existing hypertension complicating pregnancy, second trimester: Secondary | ICD-10-CM

## 2024-04-01 DIAGNOSIS — O99212 Obesity complicating pregnancy, second trimester: Secondary | ICD-10-CM | POA: Insufficient documentation

## 2024-04-02 NOTE — Progress Notes (Signed)
 MFM Consult Note  Rhonda Davies is currently at 25 weeks and 3 days.  She has been followed due to maternal obesity and chronic hypertension.  The patient reports that she is currently treated with labetalol 200 mg twice a day.  However, she was taking losartan up until 21 weeks of her current pregnancy (as she did not know that this was a medication that she should not have been taking during pregnancy).    She denies any problems since her last exam.  On today's exam, the overall EFW of 1 pound 14 ounces measures at the 56 percentile for her gestational age.  There was normal amniotic fluid noted.   The views of the fetal anatomy were visualized today.  There were no obvious anomalies noted.    The limitations of ultrasound in the detection of all anomalies was discussed.    The patient was advised that losartan use during pregnancy has been associated with fetal renal failure, oligohydramnios, and IUGR.    She was reassured that as the fetal growth is within normal limits and normal amniotic fluid was noted today, her baby has most likely not been affected.  Due to chronic hypertension, we will continue to follow her with growth ultrasounds throughout her pregnancy.    Weekly fetal testing should be started at around 32 weeks.    She will return in 5 weeks for another growth scan.  The patient stated that all of her questions were answered today.  A total of 20 minutes was spent counseling and coordinating the care for this patient.  Greater than 50% of the time was spent in direct face-to-face contact.

## 2024-04-17 LAB — OB RESULTS CONSOLE RPR: RPR: NONREACTIVE

## 2024-04-22 LAB — OB RESULTS CONSOLE HIV ANTIBODY (ROUTINE TESTING): HIV: NONREACTIVE

## 2024-04-22 LAB — OB RESULTS CONSOLE RPR: RPR: NONREACTIVE

## 2024-04-25 DIAGNOSIS — O24414 Gestational diabetes mellitus in pregnancy, insulin controlled: Secondary | ICD-10-CM

## 2024-04-25 HISTORY — DX: Gestational diabetes mellitus in pregnancy, insulin controlled: O24.414

## 2024-05-06 ENCOUNTER — Ambulatory Visit

## 2024-05-08 ENCOUNTER — Ambulatory Visit

## 2024-05-13 ENCOUNTER — Other Ambulatory Visit

## 2024-05-27 ENCOUNTER — Other Ambulatory Visit

## 2024-06-03 ENCOUNTER — Other Ambulatory Visit

## 2024-06-03 ENCOUNTER — Other Ambulatory Visit: Payer: Self-pay | Admitting: Obstetrics and Gynecology

## 2024-06-03 DIAGNOSIS — O10919 Unspecified pre-existing hypertension complicating pregnancy, unspecified trimester: Secondary | ICD-10-CM

## 2024-06-03 NOTE — Progress Notes (Signed)
induction

## 2024-06-06 ENCOUNTER — Inpatient Hospital Stay (HOSPITAL_COMMUNITY): Admit: 2024-06-06 | Admitting: Family Medicine

## 2024-06-10 LAB — OB RESULTS CONSOLE GC/CHLAMYDIA
Chlamydia: NEGATIVE
Neisseria Gonorrhea: NEGATIVE

## 2024-06-10 LAB — OB RESULTS CONSOLE GBS: GBS: POSITIVE

## 2024-06-11 ENCOUNTER — Other Ambulatory Visit: Payer: Self-pay

## 2024-06-11 DIAGNOSIS — O99013 Anemia complicating pregnancy, third trimester: Secondary | ICD-10-CM

## 2024-06-11 NOTE — Progress Notes (Signed)
 Labs: 06/10/2024: Hgb 9.9  Assessment: 1. Anemia affecting pregnancy in third trimester   2. Maternal iron deficiency anemia affecting pregnancy in third trimester, antepartum     Plan: Iron transfusion with Venofer 500 mg x 1 dose   Rhonda Davies, CNM Certified Nurse Midwife [place]  [facility]

## 2024-06-13 ENCOUNTER — Ambulatory Visit
Admission: RE | Admit: 2024-06-13 | Discharge: 2024-06-13 | Disposition: A | Discharge status: Home / Self Care | Source: Ambulatory Visit

## 2024-06-13 DIAGNOSIS — O99013 Anemia complicating pregnancy, third trimester: Secondary | ICD-10-CM | POA: Diagnosis present

## 2024-06-13 DIAGNOSIS — D509 Iron deficiency anemia, unspecified: Secondary | ICD-10-CM | POA: Insufficient documentation

## 2024-06-13 DIAGNOSIS — Z3A35 35 weeks gestation of pregnancy: Secondary | ICD-10-CM | POA: Insufficient documentation

## 2024-06-13 MED ORDER — SODIUM CHLORIDE 0.9 % IV BOLUS: 500.0000 mL | Freq: Once | INTRAVENOUS | Status: DC | PRN

## 2024-06-13 MED ORDER — DIPHENHYDRAMINE HCL 50 MG/ML IJ SOLN: 25.0000 mg | Freq: Once | INTRAMUSCULAR | Status: DC | PRN

## 2024-06-13 MED ORDER — ALBUTEROL SULFATE (2.5 MG/3ML) 0.083% IN NEBU: 2.5000 mg | INHALATION_SOLUTION | Freq: Once | RESPIRATORY_TRACT | Status: DC | PRN

## 2024-06-13 MED ORDER — IRON SUCROSE 500 MG IVPB - SIMPLE MED
500.0000 mg | Freq: Once | INTRAVENOUS | Status: AC
  Administered 2024-06-13: 500 mg via INTRAVENOUS
  Filled 2024-06-13: qty 500

## 2024-06-13 MED ORDER — EPINEPHRINE 0.3 MG/0.3ML IJ SOAJ: 0.3000 mg | Freq: Once | INTRAMUSCULAR | Status: DC | PRN

## 2024-06-13 MED ORDER — SODIUM CHLORIDE 0.9 % IV SOLN: INTRAVENOUS | Status: DC | PRN

## 2024-06-13 MED ORDER — METHYLPREDNISOLONE SODIUM SUCC 125 MG IJ SOLR: 125.0000 mg | Freq: Once | INTRAMUSCULAR | Status: DC | PRN

## 2024-06-21 ENCOUNTER — Encounter: Payer: Self-pay | Admitting: Obstetrics and Gynecology

## 2024-06-21 ENCOUNTER — Inpatient Hospital Stay: Admitting: Anesthesiology

## 2024-06-21 ENCOUNTER — Other Ambulatory Visit: Payer: Self-pay

## 2024-06-21 ENCOUNTER — Inpatient Hospital Stay
Admission: EM | Admit: 2024-06-21 | Discharge: 2024-06-23 | DRG: 806 | Disposition: A | Discharge status: Home / Self Care | Attending: Obstetrics | Admitting: Obstetrics

## 2024-06-21 DIAGNOSIS — Z8616 Personal history of COVID-19: Secondary | ICD-10-CM

## 2024-06-21 DIAGNOSIS — Z8249 Family history of ischemic heart disease and other diseases of the circulatory system: Secondary | ICD-10-CM | POA: Diagnosis not present

## 2024-06-21 DIAGNOSIS — K219 Gastro-esophageal reflux disease without esophagitis: Secondary | ICD-10-CM | POA: Diagnosis present

## 2024-06-21 DIAGNOSIS — O99214 Obesity complicating childbirth: Secondary | ICD-10-CM | POA: Diagnosis present

## 2024-06-21 DIAGNOSIS — Z6791 Unspecified blood type, Rh negative: Secondary | ICD-10-CM

## 2024-06-21 DIAGNOSIS — Z833 Family history of diabetes mellitus: Secondary | ICD-10-CM

## 2024-06-21 DIAGNOSIS — O9081 Anemia of the puerperium: Secondary | ICD-10-CM | POA: Diagnosis not present

## 2024-06-21 DIAGNOSIS — O99824 Streptococcus B carrier state complicating childbirth: Secondary | ICD-10-CM | POA: Diagnosis present

## 2024-06-21 DIAGNOSIS — O9962 Diseases of the digestive system complicating childbirth: Secondary | ICD-10-CM | POA: Diagnosis present

## 2024-06-21 DIAGNOSIS — O26893 Other specified pregnancy related conditions, third trimester: Secondary | ICD-10-CM | POA: Diagnosis present

## 2024-06-21 DIAGNOSIS — O99344 Other mental disorders complicating childbirth: Secondary | ICD-10-CM | POA: Diagnosis present

## 2024-06-21 DIAGNOSIS — O10919 Unspecified pre-existing hypertension complicating pregnancy, unspecified trimester: Principal | ICD-10-CM | POA: Diagnosis present

## 2024-06-21 DIAGNOSIS — O1092 Unspecified pre-existing hypertension complicating childbirth: Secondary | ICD-10-CM | POA: Diagnosis present

## 2024-06-21 DIAGNOSIS — D62 Acute posthemorrhagic anemia: Secondary | ICD-10-CM | POA: Diagnosis not present

## 2024-06-21 DIAGNOSIS — F419 Anxiety disorder, unspecified: Secondary | ICD-10-CM | POA: Diagnosis present

## 2024-06-21 DIAGNOSIS — F32A Depression, unspecified: Secondary | ICD-10-CM | POA: Diagnosis present

## 2024-06-21 DIAGNOSIS — Z87410 Personal history of cervical dysplasia: Secondary | ICD-10-CM | POA: Diagnosis not present

## 2024-06-21 DIAGNOSIS — O24424 Gestational diabetes mellitus in childbirth, insulin controlled: Principal | ICD-10-CM | POA: Diagnosis present

## 2024-06-21 DIAGNOSIS — Z3A37 37 weeks gestation of pregnancy: Secondary | ICD-10-CM

## 2024-06-21 LAB — CBC
HCT: 28.9 % — ABNORMAL LOW (ref 36.0–46.0)
Hemoglobin: 9.3 g/dL — ABNORMAL LOW (ref 12.0–15.0)
MCH: 26.5 pg (ref 26.0–34.0)
MCHC: 32.2 g/dL (ref 30.0–36.0)
MCV: 82.3 fL (ref 80.0–100.0)
Platelets: 244 10*3/uL (ref 150–400)
RBC: 3.51 MIL/uL — ABNORMAL LOW (ref 3.87–5.11)
RDW: 15.1 % (ref 11.5–15.5)
WBC: 9.7 10*3/uL (ref 4.0–10.5)
nRBC: 0 % (ref 0.0–0.2)

## 2024-06-21 LAB — GLUCOSE, CAPILLARY
Glucose-Capillary: 94 mg/dL (ref 70–99)
Glucose-Capillary: 105 mg/dL — ABNORMAL HIGH (ref 70–99)
Glucose-Capillary: 106 mg/dL — ABNORMAL HIGH (ref 70–99)
Glucose-Capillary: 101 mg/dL — ABNORMAL HIGH (ref 70–99)

## 2024-06-21 LAB — TYPE AND SCREEN
ABO/RH(D): A NEG
Antibody Screen: NEGATIVE

## 2024-06-21 LAB — RPR: RPR Ser Ql: NONREACTIVE

## 2024-06-21 MED ORDER — TRANEXAMIC ACID-NACL 1000-0.7 MG/100ML-% IV SOLN
INTRAVENOUS | Status: AC
  Filled 2024-06-21: qty 100

## 2024-06-21 MED ORDER — MAGNESIUM HYDROXIDE 400 MG/5ML PO SUSP: 30.0000 mL | ORAL | Status: DC | PRN

## 2024-06-21 MED ORDER — FENTANYL-BUPIVACAINE-NACL 0.5-0.125-0.9 MG/250ML-% EP SOLN
EPIDURAL | Status: DC | PRN
  Administered 2024-06-21: 12 mL/h via EPIDURAL

## 2024-06-21 MED ORDER — ONDANSETRON HCL 4 MG PO TABS: 4.0000 mg | ORAL_TABLET | ORAL | Status: DC | PRN

## 2024-06-21 MED ORDER — ZOLPIDEM TARTRATE 5 MG PO TABS: 5.0000 mg | ORAL_TABLET | Freq: Every evening | ORAL | Status: DC | PRN

## 2024-06-21 MED ORDER — OXYCODONE-ACETAMINOPHEN 5-325 MG PO TABS: 2.0000 | ORAL_TABLET | ORAL | Status: DC | PRN

## 2024-06-21 MED ORDER — LACTATED RINGERS IV SOLN: 500.0000 mL | INTRAVENOUS | Status: DC | PRN

## 2024-06-21 MED ORDER — IBUPROFEN 600 MG PO TABS
600.0000 mg | ORAL_TABLET | Freq: Four times a day (QID) | ORAL | Status: DC
  Administered 2024-06-21 – 2024-06-23 (×6): 600 mg via ORAL
  Filled 2024-06-21 (×7): qty 1

## 2024-06-21 MED ORDER — SODIUM CHLORIDE 0.9 % IV SOLN
5.0000 10*6.[IU] | Freq: Once | INTRAVENOUS | Status: AC
  Administered 2024-06-21: 5 10*6.[IU] via INTRAVENOUS
  Filled 2024-06-21: qty 5

## 2024-06-21 MED ORDER — SIMETHICONE 80 MG PO CHEW: 80.0000 mg | CHEWABLE_TABLET | ORAL | Status: DC | PRN

## 2024-06-21 MED ORDER — MISOPROSTOL 50MCG HALF TABLET
50.0000 ug | ORAL_TABLET | Freq: Once | ORAL | Status: AC
Start: 2024-06-21 — End: 2024-06-21
  Administered 2024-06-21: 50 ug via VAGINAL
  Filled 2024-06-21: qty 1

## 2024-06-21 MED ORDER — LIDOCAINE HCL (PF) 1 % IJ SOLN
INTRAMUSCULAR | Status: AC
  Filled 2024-06-21: qty 30

## 2024-06-21 MED ORDER — BUPIVACAINE HCL (PF) 0.25 % IJ SOLN
INTRAMUSCULAR | Status: DC | PRN
  Administered 2024-06-21 (×2): 4 mL via EPIDURAL

## 2024-06-21 MED ORDER — ACETAMINOPHEN 325 MG PO TABS
650.0000 mg | ORAL_TABLET | ORAL | Status: DC | PRN
  Filled 2024-06-21 (×4): qty 2

## 2024-06-21 MED ORDER — OXYTOCIN-SODIUM CHLORIDE 30-0.9 UT/500ML-% IV SOLN
2.5000 [IU]/h | INTRAVENOUS | Status: DC
  Filled 2024-06-21: qty 500

## 2024-06-21 MED ORDER — MISOPROSTOL 50MCG HALF TABLET
50.0000 ug | ORAL_TABLET | Freq: Once | ORAL | Status: AC
Start: 2024-06-21 — End: 2024-06-21
  Administered 2024-06-21: 50 ug via ORAL
  Filled 2024-06-21: qty 1

## 2024-06-21 MED ORDER — ONDANSETRON HCL 4 MG/2ML IJ SOLN: 4.0000 mg | Freq: Four times a day (QID) | INTRAMUSCULAR | Status: DC | PRN

## 2024-06-21 MED ORDER — FENTANYL-BUPIVACAINE-NACL 0.5-0.125-0.9 MG/250ML-% EP SOLN: 12.0000 mL/h | EPIDURAL | Status: DC | PRN

## 2024-06-21 MED ORDER — LACTATED RINGERS IV SOLN: INTRAVENOUS | Status: DC

## 2024-06-21 MED ORDER — LACTATED RINGERS IV SOLN
500.0000 mL | Freq: Once | INTRAVENOUS | Status: AC
  Administered 2024-06-21: 500 mL via INTRAVENOUS

## 2024-06-21 MED ORDER — VARICELLA VIRUS VACCINE LIVE 1350 PFU/0.5ML IJ SUSR
0.5000 mL | Freq: Once | INTRAMUSCULAR | Status: DC
  Filled 2024-06-21: qty 0.5

## 2024-06-21 MED ORDER — LIDOCAINE HCL (PF) 1 % IJ SOLN
INTRAMUSCULAR | Status: DC | PRN
Start: 2024-06-21 — End: 2024-06-21
  Administered 2024-06-21: 3 mL via SUBCUTANEOUS

## 2024-06-21 MED ORDER — MISOPROSTOL 200 MCG PO TABS
ORAL_TABLET | ORAL | Status: AC
  Filled 2024-06-21: qty 4

## 2024-06-21 MED ORDER — COCONUT OIL OIL
1.0000 | TOPICAL_OIL | Status: DC | PRN
  Filled 2024-06-21: qty 7.5

## 2024-06-21 MED ORDER — FERROUS SULFATE 325 (65 FE) MG PO TABS
325.0000 mg | ORAL_TABLET | Freq: Two times a day (BID) | ORAL | Status: DC
  Administered 2024-06-22 – 2024-06-23 (×3): 325 mg via ORAL
  Filled 2024-06-21 (×3): qty 1

## 2024-06-21 MED ORDER — PHENYLEPHRINE 80 MCG/ML (10ML) SYRINGE FOR IV PUSH (FOR BLOOD PRESSURE SUPPORT): 80.0000 ug | PREFILLED_SYRINGE | INTRAVENOUS | Status: DC | PRN

## 2024-06-21 MED ORDER — CARBOPROST TROMETHAMINE 250 MCG/ML IM SOLN
INTRAMUSCULAR | Status: DC
Start: 2024-06-21 — End: 2024-06-21
  Filled 2024-06-21: qty 1

## 2024-06-21 MED ORDER — TERBUTALINE SULFATE 1 MG/ML IJ SOLN: 0.2500 mg | Freq: Once | INTRAMUSCULAR | Status: DC | PRN

## 2024-06-21 MED ORDER — PENICILLIN G POT IN DEXTROSE 60000 UNIT/ML IV SOLN: 3.0000 10*6.[IU] | INTRAVENOUS | Status: DC

## 2024-06-21 MED ORDER — AMMONIA AROMATIC IN INHA
RESPIRATORY_TRACT | Status: DC
Start: 2024-06-21 — End: 2024-06-21
  Filled 2024-06-21: qty 10

## 2024-06-21 MED ORDER — MISOPROSTOL 25 MCG QUARTER TABLET
25.0000 ug | ORAL_TABLET | ORAL | Status: DC | PRN
Start: 2024-06-21 — End: 2024-06-21
  Administered 2024-06-21: 50 ug via VAGINAL

## 2024-06-21 MED ORDER — OXYCODONE HCL 5 MG PO TABS: 5.0000 mg | ORAL_TABLET | ORAL | Status: DC | PRN

## 2024-06-21 MED ORDER — TERBUTALINE SULFATE 1 MG/ML IJ SOLN
0.2500 mg | Freq: Once | INTRAMUSCULAR | Status: DC | PRN
Start: 2024-06-21 — End: 2024-06-21

## 2024-06-21 MED ORDER — MEASLES, MUMPS & RUBELLA VAC IJ SOLR
0.5000 mL | Freq: Once | INTRAMUSCULAR | Status: DC
  Filled 2024-06-21: qty 0.5

## 2024-06-21 MED ORDER — DIPHENHYDRAMINE HCL 25 MG PO CAPS: 25.0000 mg | ORAL_CAPSULE | Freq: Four times a day (QID) | ORAL | Status: DC | PRN

## 2024-06-21 MED ORDER — DIPHENHYDRAMINE HCL 50 MG/ML IJ SOLN: 12.5000 mg | INTRAMUSCULAR | Status: DC | PRN

## 2024-06-21 MED ORDER — WITCH HAZEL-GLYCERIN EX PADS
1.0000 | MEDICATED_PAD | CUTANEOUS | Status: DC | PRN
  Administered 2024-06-21: 1 via TOPICAL
  Filled 2024-06-21 (×2): qty 100

## 2024-06-21 MED ORDER — ONDANSETRON HCL 4 MG/2ML IJ SOLN: 4.0000 mg | INTRAMUSCULAR | Status: DC | PRN

## 2024-06-21 MED ORDER — ACETAMINOPHEN 325 MG PO TABS
650.0000 mg | ORAL_TABLET | ORAL | Status: DC | PRN
  Administered 2024-06-21 – 2024-06-22 (×3): 650 mg via ORAL

## 2024-06-21 MED ORDER — OXYCODONE HCL 5 MG PO TABS: 10.0000 mg | ORAL_TABLET | ORAL | Status: DC | PRN

## 2024-06-21 MED ORDER — OXYTOCIN BOLUS FROM INFUSION
333.0000 mL | Freq: Once | INTRAVENOUS | Status: AC
  Administered 2024-06-21: 333 mL via INTRAVENOUS

## 2024-06-21 MED ORDER — OXYTOCIN 10 UNIT/ML IJ SOLN
INTRAMUSCULAR | Status: AC
  Filled 2024-06-21: qty 2

## 2024-06-21 MED ORDER — DIBUCAINE (PERIANAL) 1 % EX OINT
1.0000 | TOPICAL_OINTMENT | CUTANEOUS | Status: DC | PRN
Start: 2024-06-21 — End: 2024-06-23

## 2024-06-21 MED ORDER — NIFEDIPINE ER OSMOTIC RELEASE 30 MG PO TB24
30.0000 mg | ORAL_TABLET | Freq: Every day | ORAL | Status: DC
  Administered 2024-06-21 – 2024-06-22 (×2): 30 mg via ORAL
  Filled 2024-06-21 (×2): qty 1

## 2024-06-21 MED ORDER — EPHEDRINE 5 MG/ML INJ: 10.0000 mg | INTRAVENOUS | Status: DC | PRN

## 2024-06-21 MED ORDER — LIDOCAINE-EPINEPHRINE (PF) 1.5 %-1:200000 IJ SOLN
INTRAMUSCULAR | Status: DC | PRN
  Administered 2024-06-21: 3 mL via EPIDURAL

## 2024-06-21 MED ORDER — OXYCODONE-ACETAMINOPHEN 5-325 MG PO TABS: 1.0000 | ORAL_TABLET | ORAL | Status: DC | PRN

## 2024-06-21 MED ORDER — OXYTOCIN-SODIUM CHLORIDE 30-0.9 UT/500ML-% IV SOLN
1.0000 m[IU]/min | INTRAVENOUS | Status: DC
  Administered 2024-06-21: 4 m[IU]/min via INTRAVENOUS

## 2024-06-21 MED ORDER — FENTANYL-BUPIVACAINE-NACL 0.5-0.125-0.9 MG/250ML-% EP SOLN
EPIDURAL | Status: AC
  Filled 2024-06-21: qty 250

## 2024-06-21 MED ORDER — METHYLERGONOVINE MALEATE 0.2 MG/ML IJ SOLN
INTRAMUSCULAR | Status: AC
  Filled 2024-06-21: qty 1

## 2024-06-21 MED ORDER — PENICILLIN G POT IN DEXTROSE 60000 UNIT/ML IV SOLN
3.0000 10*6.[IU] | INTRAVENOUS | Status: DC
  Administered 2024-06-21 (×3): 3 10*6.[IU] via INTRAVENOUS
  Filled 2024-06-21 (×3): qty 50

## 2024-06-21 MED ORDER — LIDOCAINE HCL (PF) 1 % IJ SOLN: 30.0000 mL | INTRAMUSCULAR | Status: DC | PRN

## 2024-06-21 NOTE — H&P (Signed)
 Rhonda Davies is a 33 y.o. female presenting for IOL for A2GDM and Chtn on meds  37+0 EGA today . AGA on last fetal growth 05/29/24. OB History     Gravida  3   Para  2   Term  2   Preterm  0   AB  0   Living  2      SAB  0   IAB  0   Ectopic  0   Multiple  0   Live Births  2        Obstetric Comments  1. Growth restriction, PIH in pregnancy with severe-pre-eclampsia developing in labor.        Past Medical History:  Diagnosis Date   GAD (generalized anxiety disorder)    H/O chlamydia infection 01/27/2016   Chlamydia infection 12/2015, treated.     H/O trichomoniasis 12/14/2016   History of cervical dysplasia 03/2021   CIN 1   History of COVID-19 12/2021   History of pregnancy induced hypertension    Hypertension    Left ureteral calculus    MDD (major depressive disorder)    Migraines    Wears glasses    Past Surgical History:  Procedure Laterality Date   CYST EXCISION  01/17/2012   @[facility] ;   anterior neck  (benign epidermal)   CYSTOSCOPY/URETEROSCOPY/HOLMIUM LASER/STENT PLACEMENT Left 06/24/2022   Procedure: CYSTOSCOPY/LEFT URETEROSCOPY/HOLMIUM LASER/ LEFT RETROGRADE PYELOGRAM/STONE EXTRACTION/  LEFT STENT PLACEMENT;  Surgeon: Watt Rush, MD;  Location: [facility];  Service: Urology;  Laterality: Left;   EXTRACORPOREAL SHOCK WAVE LITHOTRIPSY Left 05/16/2022   Procedure: EXTRACORPOREAL SHOCK WAVE LITHOTRIPSY (ESWL);  Surgeon: Watt Rush, MD;  [facility];  Service: Urology;  Laterality: Left;   TONSILLECTOMY AND ADENOIDECTOMY  2007   Family History: family history includes Aneurysm in her paternal uncle; Anxiety disorder in her brother, father, and paternal aunt; Bipolar disorder in her paternal aunt; Diabetes in her paternal aunt and paternal grandmother; Heart disease in her father, maternal grandfather, and paternal grandfather. Social History:  reports that she has never smoked. She has never used smokeless  tobacco. She reports that she does not currently use alcohol. She reports that she does not use drugs.     Maternal Diabetes: Yes:  Diabetes Type:  Insulin /Medication controlled Genetic Screening: Normal Maternal Ultrasounds/Referrals: Normal Fetal Ultrasounds or other Referrals:  None Maternal Substance Abuse:  No Significant Maternal Medications:  Meds include: Other: insulin  20 units lantus bid  Labetalol  300 mg tid  Significant Maternal Lab Results:  Group B Strep positive and Rh negative Number of Prenatal Visits:greater than 3 verified prenatal visits Maternal Vaccinations:TDap Other Comments:  None  Review of Systems History  Cx by TJS  Tft / 25% / -3  VTX  Cat 1 fetal monitoring  Irregular ctx   PE : lungs:cta  Cv RRR  Abd : gravid  Blood pressure 131/81, pulse 83, temperature 98.2 F (36.8 C), temperature source Oral, resp. rate 18, height 5' 3 (1.6 m), weight 114.3 kg, last menstrual period 10/06/2023. Prenatal labs: ABO, Rh: --/--/A NEG (06/27 0533) Antibody: NEG (06/27 0533) Rubella: Immune (01/10 0000) VZ - non immune - RPR: Nonreactive (04/28 0000)  HBsAg: Negative (01/10 0000)  HIV: Non-reactive (04/28 0000)  GBS: Positive/-- (06/16 0000)   Assessment/Plan: IOL for CHTN at 37weeks , on medication  Replaced second 50 mcg dose of cytotec     Cherise Fedder J Alva Broxson 06/21/2024, 10:13 AM

## 2024-06-21 NOTE — Anesthesia Preprocedure Evaluation (Signed)
 Anesthesia Evaluation  Patient identified by MRN, date of birth, ID band Patient awake    Reviewed: Allergy & Precautions, H&P , NPO status , Patient's Chart, lab work & pertinent test results  Airway Mallampati: II  TM Distance: >3 FB     Dental no notable dental hx.    Pulmonary    Pulmonary exam normal        Cardiovascular hypertension, Normal cardiovascular exam     Neuro/Psych  Headaches PSYCHIATRIC DISORDERS Anxiety Depression       GI/Hepatic ,GERD  Medicated and Controlled,,  Endo/Other  diabetes, Gestational    Renal/GU      Musculoskeletal   Abdominal   Peds  Hematology   Anesthesia Other Findings   Reproductive/Obstetrics (+) Pregnancy                             Anesthesia Physical Anesthesia Plan  ASA: 2  Anesthesia Plan: Epidural   Post-op Pain Management:    Induction:   PONV Risk Score and Plan:   Airway Management Planned:   Additional Equipment:   Intra-op Plan:   Post-operative Plan:   Informed Consent: I have reviewed the patients History and Physical, chart, labs and discussed the procedure including the risks, benefits and alternatives for the proposed anesthesia with the patient or authorized representative who has indicated his/her understanding and acceptance.     Dental Advisory Given  Plan Discussed with: Anesthesiologist and CRNA  Anesthesia Plan Comments:        Anesthesia Quick Evaluation

## 2024-06-21 NOTE — Discharge Summary (Signed)
 Obstetrical Discharge Summary  Patient Name: Rhonda Davies DOB: 21-Aug-1991 MRN: [id number]  Date of Admission: 06/21/2024 Date of Delivery: 06/21/24 Delivered by: ONEIDA Dinsmore MD/D. Tanda, CNM Date of Discharge: 06/23/2024  Primary OB: [facility]  OFE:Ejupzwu'd last menstrual period was 10/06/2023. EDC Estimated Date of Delivery: 07/12/24 Gestational Age at Delivery: [redacted]w[redacted]d   Antepartum complications:  - cHTN - GDM A2 - obesity - Rh negative - anemia - anxiety and depression - GBS+ - varicella non-immune - migraines  Admitting Diagnosis:  IOL for GDM A2 and cHTN Secondary Diagnosis: NSVD Patient Active Problem List   Diagnosis Date Noted   Chronic hypertension affecting pregnancy 06/21/2024   Situational stress 05/27/2020   Blood pressure elevated without history of HTN 05/27/2020   Post-dates pregnancy 02/18/2019   Current mild episode of major depressive disorder without prior episode (HCC) 06/12/2018   Anxiety disorder 04/03/2018   Stress headaches 04/03/2018   Family history of diabetes mellitus 03/20/2018   H/O trichomoniasis 12/14/2016   ASCUS with positive high risk HPV 05/29/2016   Morbid obesity with BMI of 40.0-44.9, adult (HCC) 04/26/2016   H/O chlamydia infection 01/27/2016    Augmentation: AROM and Pitocin  Complications: None Intrapartum complications/course:  Date of Delivery: 06/21/24 Delivered By:  EDDY Tanda / T. Schermerhorn MD Delivery Type: spontaneous vaginal delivery Anesthesia: epidural Placenta: spontaneous Laceration: none  Episiotomy: none Newborn Data: Live born female [place] Birth Weight:  3110 g APGAR: , 9/10  Newborn Delivery   Birth date/time: 06/21/2024 20:26:00 Delivery type: Vaginal, Spontaneous     Postpartum Procedures: none  Edinburgh:     06/22/2024    2:00 AM 02/19/2019   12:00 PM 02/19/2019    8:30 AM  Edinburgh Postnatal Depression Scale Screening Tool  I have been able to laugh and see the  funny side of things. 0 0 --  I have looked forward with enjoyment to things. 0 0   I have blamed myself unnecessarily when things went wrong. 0 2   I have been anxious or worried for no good reason. 1 2   I have felt scared or panicky for no good reason. 0 1   Things have been getting on top of me. 0 1   I have been so unhappy that I have had difficulty sleeping. 0 0   I have felt sad or miserable. 0 1   I have been so unhappy that I have been crying. 0 1   The thought of harming myself has occurred to me. 0 0   Edinburgh Postnatal Depression Scale Total 1 8       Data saved with a previous flowsheet row definition    Post partum course:  Patient had an uncomplicated postpartum course.  By time of discharge on PPD#2, her pain was controlled on oral pain medications; she had appropriate lochia and was ambulating, voiding without difficulty and tolerating regular diet.  She was deemed stable for discharge to home.    Discharge Physical Exam:  BP 118/64 (BP Location: Right Arm)   Pulse 91   Temp 99 F (37.2 C) (Oral)   Resp 16   Ht 5' 3 (1.6 m)   Wt 114.3 kg   LMP 10/06/2023   SpO2 99%   Breastfeeding Unknown   BMI 44.64 kg/m   General: NAD CV: RRR Pulm: CTABL, nl effort ABD: s/nd/nt, fundus firm and below the umbilicus Lochia: moderate Incision: c/d/i DVT Evaluation: LE non-ttp, no evidence of DVT on exam.  Hemoglobin  Date Value Ref Range Status  06/22/2024 10.1 (L) 12.0 - 15.0 g/dL Final  92/90/7978 87.7 11.1 - 15.9 g/dL Final   HCT  Date Value Ref Range Status  06/22/2024 31.3 (L) 36.0 - 46.0 % Final   Hematocrit  Date Value Ref Range Status  07/03/2020 37.7 34.0 - 46.6 % Final     Disposition: stable, discharge to home. Baby Feeding: breastmilk Baby Disposition: home with mom  Rh Immune globulin  given:  Rubella vaccine given:  Tdap vaccine given in AP or PP setting:  Flu vaccine given in AP or PP setting:   Contraception: elective interval L/S BTL   Make appt 4 weeks PP with TJS   Risk assessment for postpartum VTE and prophylactic treatment: Very high risk factors: None High risk factors: BMI 40-50 kg/m2 Moderate risk factors: None  Postpartum VTE prophylaxis with LMWH not indicated  Prenatal Labs:  ABO, Rh: --/--/A NEG (06/27 0533) Antibody: NEG (06/27 0533) Rubella: Immune (01/10 0000) VZ - non immune - RPR: Nonreactive (04/28 0000)  HBsAg: Negative (01/10 0000)  HIV: Non-reactive (04/28 0000)  GBS: Positive/-- (06/16 0000) , adequate TX    Plan:  Follow-up appointment with delivering provider in 6 weeks.  Discharge Medications: Allergies as of 06/23/2024       Reactions   Benzonatate Swelling   Covid-19 Mrna Vaccine (pfizer) [covid-19 Mrna Vacc (moderna)]    SWELLING LEFT ARM        Medication List     STOP taking these medications    busPIRone  5 MG tablet Commonly known as: BUSPAR    fluticasone 50 MCG/ACT nasal spray Commonly known as: FLONASE   labetalol  300 MG tablet Commonly known as: NORMODYNE    losartan 50 MG tablet Commonly known as: COZAAR   ondansetron  4 MG disintegrating tablet Commonly known as: ZOFRAN -ODT   ondansetron  4 MG tablet Commonly known as: ZOFRAN    pantoprazole  20 MG tablet Commonly known as: Protonix    promethazine  25 MG tablet Commonly known as: PHENERGAN    propranolol 20 MG tablet Commonly known as: INDERAL   topiramate 100 MG tablet Commonly known as: TOPAMAX   Ubrelvy 100 MG Tabs Generic drug: Ubrogepant   Wegovy  1 MG/0.5ML Soaj Generic drug: Semaglutide -Weight Management   Wegovy  1.7 MG/0.75ML Soaj Generic drug: Semaglutide -Weight Management   Wegovy  2.4 MG/0.75ML Soaj Generic drug: Semaglutide -Weight Management       TAKE these medications    acetaminophen  325 MG tablet Commonly known as: Tylenol  Take 2 tablets (650 mg total) by mouth every 4 (four) hours as needed (for pain scale < 4).   albuterol  108 (90 Base) MCG/ACT inhaler Commonly  known as: VENTOLIN  HFA SMARTSIG:2 inhalation Via Inhaler Every 6 Hours PRN   buPROPion 300 MG 24 hr tablet Commonly known as: WELLBUTRIN XL Take 300 mg by mouth daily.   coconut oil Oil Apply 1 Application topically as needed.   dibucaine 1 % Oint Commonly known as: NUPERCAINAL Place 1 Application rectally as needed for hemorrhoids.   docusate sodium  100 MG capsule Commonly known as: COLACE Take 1 capsule (100 mg total) by mouth 2 (two) times daily.   ferrous sulfate 325 (65 FE) MG tablet Take 1 tablet (325 mg total) by mouth 2 (two) times daily with a meal.   ibuprofen  600 MG tablet Commonly known as: ADVIL  Take 1 tablet (600 mg total) by mouth every 6 (six) hours.   magnesium  hydroxide 400 MG/5ML suspension Commonly known as: MILK OF MAGNESIA Take 30 mLs by mouth every three (3)  days as needed for mild constipation (if no BM).   NIFEdipine  30 MG 24 hr tablet Commonly known as: ADALAT  CC Take 1 tablet (30 mg total) by mouth daily. Start taking on: June 24, 2024   simethicone  80 MG chewable tablet Commonly known as: MYLICON Chew 1 tablet (80 mg total) by mouth as needed for flatulence.   venlafaxine 37.5 MG tablet Commonly known as: EFFEXOR Take 37.5 mg by mouth 2 (two) times daily.   VITAMIN B12 PO Take 1 tablet by mouth daily.   VITAMIN D PO Take 5,000 Int'l Units/day by mouth daily. D2   witch hazel-glycerin  pad Commonly known as: TUCKS Apply 1 Application topically as needed for hemorrhoids.         Follow-up Information     Tanda Edsel Fuller, CNM Follow up in 2 day(s).   Specialty: Certified Nurse Midwife Why: bp check Contact information: [address] [place] [address] [phone]         Tanda Edsel Fuller, CNM Follow up in 2 week(s).   Specialty: Certified Nurse Midwife Why: mood check Contact information: [address] [place] [address] [phone]         Schermerhorn, Debby PARAS, MD Follow up  in 4 week(s).   Specialty: Obstetrics and Gynecology Why: preop BTL Contact information: [address] [place] [address] [phone]         Tanda Edsel Fuller, CNM Follow up in 6 week(s).   Specialty: Certified Nurse Midwife Why: pp visit/ 2 hr gtt Contact information: [address] [place] [address] [phone]                 Signed: Bobbette Brunswick CNM

## 2024-06-21 NOTE — Progress Notes (Signed)
 Rhonda Davies is a 32 y.o. G3P2002 at [redacted]w[redacted]d by  admitted for IOL chtn on meds  Subjective: Comfortable with CLE   Objective: BP (!) 144/68 (BP Location: Left Arm)   Pulse 91   Temp 98.2 F (36.8 C) (Oral)   Resp 16   Ht 5' 3 (1.6 m)   Wt 114.3 kg   LMP 10/06/2023   BMI 44.64 kg/m  I/O last 3 completed shifts: In: 520 [P.O.:520] Out: -  No intake/output data recorded.  FHT:  FHR: 140 bpm, variability: moderate,  accelerations:  Present,  decelerations:  Absent UC:   regular, every 2 minutes SVE:   Dilation: 7 Effacement (%): 80 Station: 0 Exam by:: T Rumaysa Sabatino, MD  Labs: Lab Results  Component Value Date   WBC 9.7 06/21/2024   HGB 9.3 (L) 06/21/2024   HCT 28.9 (L) 06/21/2024   MCV 82.3 06/21/2024   PLT 244 06/21/2024    Assessment / Plan: Adequate progression anticipate SVD    Rhonda JINNY Dinsmore, MD 06/21/2024, 7:43 PM

## 2024-06-21 NOTE — Progress Notes (Signed)
 Called by RN into room for imminent delivery with Dr. ONEIDA. Schermerhorn en route. Patient crowning. Delivery of a viable female infant 06/21/2024 at 2026 by Edsel Blush, CNM. Delivery of fetal head in OA position with restitution to LOA. Loose nuchal cord, delivered through;  Anterior then posterior shoulders delivered easily with gentle downward traction. Baby placed on mom's chest, and attended to by peds. Spontaneous cry/breathing.   Dr. IVAR Schermerhorn arrived to finish the delivery shortly after delivery of the infant. See his delivery note for more details.  Edsel Charlies Blush, CNM 06/21/2024 8:54 PM

## 2024-06-21 NOTE — Progress Notes (Signed)
 Rhonda Davies is a 33 y.o. G3P2002 at [redacted]w[redacted]d by  admitted for  IOL Subjective: Some cramping   Objective: BP (!) 106/43 (BP Location: Right Arm)   Pulse (!) 58   Temp 98 F (36.7 C) (Oral)   Resp 16   Ht 5' 3 (1.6 m)   Wt 114.3 kg   LMP 10/06/2023   BMI 44.64 kg/m  No intake/output data recorded. No intake/output data recorded.  FHT:  FHR: 130 bpm, variability: moderate,  accelerations:  Present,  decelerations:  Absent UC:   irregular, every 3-5 minutes SVE:    AROm clear : cx 1cm / 40%/ -2 vtx IUPC palced   Labs: Lab Results  Component Value Date   WBC 9.7 06/21/2024   HGB 9.3 (L) 06/21/2024   HCT 28.9 (L) 06/21/2024   MCV 82.3 06/21/2024   PLT 244 06/21/2024    Assessment / Plan: Early IOL  Start high dose pitocin     Debby JINNY Dinsmore, MD 06/21/2024, 1:49 PM

## 2024-06-21 NOTE — Progress Notes (Signed)
 Patient ID: Rhonda Davies, female   DOB: 1991/03/20, 33 y.o.   MRN: [id number] Ctx more painful  Cx 4 / 80 / 0   Adequate CTX pattern Cat 1 fetal monitoring  CLE placed . Cont Pitocin 

## 2024-06-21 NOTE — Anesthesia Procedure Notes (Signed)
 Epidural Patient location during procedure: OB Start time: 06/21/2024 5:25 PM End time: 06/21/2024 5:30 PM  Staffing Anesthesiologist: Chesley Lendia CROME, MD Resident/CRNA: Erie Jyl MATSU, CRNA Performed: resident/CRNA   Preanesthetic Checklist Completed: patient identified, IV checked, site marked, risks and benefits discussed, surgical consent, monitors and equipment checked, pre-op evaluation and timeout performed  Epidural Patient position: sitting Prep: ChloraPrep Patient monitoring: heart rate, continuous pulse ox and blood pressure Approach: midline Location: L3-L4 Injection technique: LOR air  Needle:  Needle type: Tuohy  Needle gauge: 17 G Needle length: 9 cm and 9 Needle insertion depth: 7 cm Catheter type: closed end flexible Catheter size: 19 Gauge Catheter at skin depth: 13 cm Test dose: negative and 1.5% lidocaine  with Epi 1:200 K  Assessment Events: blood not aspirated, no cerebrospinal fluid, injection not painful, no injection resistance, no paresthesia and negative IV test  Additional Notes 1 attempt Pt. Evaluated and documentation done after procedure finished. Patient identified. Risks/Benefits/Options discussed with patient including but not limited to bleeding, infection, nerve damage, paralysis, failed block, incomplete pain control, headache, blood pressure changes, nausea, vomiting, reactions to medication both or allergic, itching and postpartum back pain. Confirmed with bedside nurse the patient's most recent platelet count. Confirmed with patient that they are not currently taking any anticoagulation, have any bleeding history or any family history of bleeding disorders. Patient expressed understanding and wished to proceed. All questions were answered. Sterile technique was used throughout the entire procedure. Please see nursing notes for vital signs. Test dose was given through epidural catheter and negative prior to continuing to dose epidural or start  infusion. Warning signs of high block given to the patient including shortness of breath, tingling/numbness in hands, complete motor block, or any concerning symptoms with instructions to call for help. Patient was given instructions on fall risk and not to get out of bed. All questions and concerns addressed with instructions to call with any issues or inadequate analgesia.    Patient tolerated the insertion well without immediate complications.Reason for block:procedure for pain

## 2024-06-22 LAB — CBC
HCT: 31.3 % — ABNORMAL LOW (ref 36.0–46.0)
Hemoglobin: 10.1 g/dL — ABNORMAL LOW (ref 12.0–15.0)
MCH: 26.4 pg (ref 26.0–34.0)
MCHC: 32.3 g/dL (ref 30.0–36.0)
MCV: 81.9 fL (ref 80.0–100.0)
Platelets: 245 10*3/uL (ref 150–400)
RBC: 3.82 MIL/uL — ABNORMAL LOW (ref 3.87–5.11)
RDW: 15.8 % — ABNORMAL HIGH (ref 11.5–15.5)
WBC: 13.4 10*3/uL — ABNORMAL HIGH (ref 4.0–10.5)
nRBC: 0 % (ref 0.0–0.2)

## 2024-06-22 LAB — FETAL SCREEN: Fetal Screen: NEGATIVE

## 2024-06-22 LAB — GLUCOSE, CAPILLARY: Glucose-Capillary: 82 mg/dL (ref 70–99)

## 2024-06-22 MED ORDER — DOCUSATE SODIUM 100 MG PO CAPS
100.0000 mg | ORAL_CAPSULE | Freq: Two times a day (BID) | ORAL | Status: DC
Start: 2024-06-22 — End: 2024-06-23
  Administered 2024-06-22 – 2024-06-23 (×2): 100 mg via ORAL
  Filled 2024-06-22 (×2): qty 1

## 2024-06-22 MED ORDER — RHO D IMMUNE GLOBULIN 1500 UNIT/2ML IJ SOSY
300.0000 ug | PREFILLED_SYRINGE | Freq: Once | INTRAMUSCULAR | Status: AC
  Administered 2024-06-22: 300 ug via INTRAVENOUS
  Filled 2024-06-22: qty 2

## 2024-06-22 MED ORDER — NIFEDIPINE ER OSMOTIC RELEASE 30 MG PO TB24
30.0000 mg | ORAL_TABLET | Freq: Every day | ORAL | Status: DC
  Administered 2024-06-23: 30 mg via ORAL
  Filled 2024-06-22: qty 1

## 2024-06-22 NOTE — Anesthesia Postprocedure Evaluation (Signed)
 Anesthesia Post Note  Patient: Rhonda Davies  Procedure(s) Performed: AN AD HOC LABOR EPIDURAL  Patient location during evaluation: Mother Baby Anesthesia Type: Epidural Level of consciousness: awake and alert Pain management: pain level controlled Vital Signs Assessment: post-procedure vital signs reviewed and stable Respiratory status: spontaneous breathing, nonlabored ventilation and respiratory function stable Cardiovascular status: stable Postop Assessment: no headache, epidural receding and able to ambulate (pt has headache that feels like her typical migraine) Anesthetic complications: no Comments: Discussed headache; no concern for spinal puncture at this time. Told patient to notify OB if headache changes or worsens; she agrees.   No notable events documented.   Last Vitals:  Vitals:   06/22/24 0833 06/22/24 1150  BP: (!) 140/83 118/70  Pulse: 78 89  Resp: 18 18  Temp: 36.6 C 36.8 C  SpO2: 99% 98%    Last Pain:  Vitals:   06/22/24 1150  TempSrc: Oral  PainSc:                  [name]

## 2024-06-22 NOTE — Progress Notes (Signed)
 Postpartum Day  1  Subjective: 33 y.o. H6E6996 postpartum day #1 status post normal spontaneous vaginal delivery. She is ambulating, is tolerating po, is voiding spontaneously.  Her pain is well controlled on PO pain medications. Her lochia is less than menses.  Objective: BP 118/70 (BP Location: Right Arm)   Pulse 89   Temp 98.3 F (36.8 C) (Oral)   Resp 18   Ht 5' 3 (1.6 m)   Wt 114.3 kg   LMP 10/06/2023   SpO2 98%   Breastfeeding Unknown   BMI 44.64 kg/m    Physical Exam:  General: alert, cooperative, and appears stated age Breasts: soft/nontender Pulm: nl effort Abdomen: soft, non-tender, active bowel sounds Uterine Fundus: firm Perineum: minimal edema, intact Lochia: appropriate DVT Evaluation: No evidence of DVT seen on physical exam. Negative Homan's sign. No cords or calf tenderness. No significant calf/ankle edema.  Recent Labs    06/21/24 0533 06/22/24 0528  HGB 9.3* 10.1*  HCT 28.9* 31.3*  WBC 9.7 13.4*  PLT 244 245    Assessment/Plan: 32 y.o. H6E6996 Postpartum Day  1  1. Continue routine postpartum care  2. Infant feeding status: breast feeding --Lactation consult PRN for breastfeeding   3. Contraception plan: bilateral tubal ligation  4. Acute blood loss anemia - clinically significant.  --Hemodynamically stable and asymptomatic --Intervention: continue on oral supplementation with ferrous sulfate 325  5. Immunization status:   all immunizations up to date  6. Continue monitoring BP, had some mild range BP, may increase Procardia  x1 to 60 mg with continued elevation  Disposition: continue inpatient postpartum care    LOS: 1 day   Levoy Geisen, CNM 06/22/2024, 2:22 PM   ----- Bobbette Brunswick Certified Nurse Midwife [facility]

## 2024-06-22 NOTE — Lactation Note (Signed)
 This note was copied from a baby's chart. Lactation Consultation Note  Patient Name: Rhonda Davies Date: 06/22/2024 Age:31 hours Reason for consult: Initial assessment;Early term 37-38.6wks   Maternal Data Has patient been taught Hand Expression?: Yes Does the patient have breastfeeding experience prior to this delivery?: Yes How long did the patient breastfeed?: 6 mths  Feeding Mother's Current Feeding Choice: Breast Milk Baby sucking on pacifier and rooting, encouraged mom to offer breast to observe latch and feeding, baby became gaggy and spit small amt clear mucous and then yellow fluid, mom states baby had been latching and nursing well, audible swallows per mom   LATCH Score Latch: Too sleepy or reluctant, no latch achieved, no sucking elicited. (became gaggy and spit)                  Lactation Tools Discussed/Used    Interventions  LC name and no written on white board  Discharge Pump: Personal WIC Program: No  Consult Status Consult Status: PRN    [name] 06/22/2024, 4:49 PM

## 2024-06-23 LAB — RHOGAM INJECTION: Unit division: 0

## 2024-06-23 MED ORDER — SIMETHICONE 80 MG PO CHEW: 80.0000 mg | CHEWABLE_TABLET | ORAL | Status: DC | PRN

## 2024-06-23 MED ORDER — DIBUCAINE (PERIANAL) 1 % EX OINT: 1.0000 | TOPICAL_OINTMENT | CUTANEOUS | Status: DC | PRN

## 2024-06-23 MED ORDER — MAGNESIUM HYDROXIDE 400 MG/5ML PO SUSP
30.0000 mL | ORAL | Status: DC | PRN
Start: 2024-06-23 — End: 2024-08-15

## 2024-06-23 MED ORDER — FERROUS SULFATE 325 (65 FE) MG PO TABS: 325.0000 mg | ORAL_TABLET | Freq: Two times a day (BID) | ORAL | Status: DC

## 2024-06-23 MED ORDER — COCONUT OIL OIL
1.0000 | TOPICAL_OIL | Status: DC | PRN
Start: 2024-06-23 — End: 2024-08-15

## 2024-06-23 MED ORDER — ACETAMINOPHEN 325 MG PO TABS: 650.0000 mg | ORAL_TABLET | ORAL | Status: AC | PRN

## 2024-06-23 MED ORDER — IBUPROFEN 600 MG PO TABS
600.0000 mg | ORAL_TABLET | Freq: Four times a day (QID) | ORAL | 0 refills | Status: DC
Start: 2024-06-23 — End: 2024-08-15

## 2024-06-23 MED ORDER — NIFEDIPINE ER 30 MG PO TB24: 30.0000 mg | ORAL_TABLET | Freq: Every day | ORAL | 0 refills | Status: DC

## 2024-06-23 MED ORDER — DOCUSATE SODIUM 100 MG PO CAPS: 100.0000 mg | ORAL_CAPSULE | Freq: Two times a day (BID) | ORAL | Status: DC

## 2024-06-23 MED ORDER — WITCH HAZEL-GLYCERIN EX PADS
1.0000 | MEDICATED_PAD | CUTANEOUS | Status: DC | PRN
Start: 2024-06-23 — End: 2024-08-15

## 2024-06-23 NOTE — Lactation Note (Signed)
 This note was copied from a baby's chart. Lactation Consultation Note  Patient Name: Girl Analys Ryden Unijb'd Date: 06/23/2024 Age:31 hours Reason for consult: Early term 37-38.6wks;Maternal discharge   Maternal Data  MOB and FOB preparing for discharge, with MOB holding swaddled and sleeping infant upon LC room entry. MOB states she has previous breastfeeding experience with other two children, and declines any current questions or concerns regarding infant feeding at this time.    Feeding Mother's Current Feeding Choice: Breast Milk No feeding observed, LC reviewed feeding plan, hunger cues, feeding every 3 hours, and follow-up care with outpatient clinic should concerns arise.    Lactation Tools Discussed/Used  LC provided education regarding using ice and NSAIDS to relieve any pain associated with engorgement following the removal of milk every 3 hours via feeding of infant at breast, hand expression, or utilization of DEBP. Patient expressed understanding and will contact clinic with updates PRN.   Discharge Discharge Education: Engorgement and breast care;Warning signs for feeding baby;Outpatient recommendation  Consult Status Consult Status: Complete    Donald JONETTA Minerva 06/23/2024, 12:07 PM

## 2024-08-07 NOTE — H&P (Signed)
 Rhonda Davies is a 33 y.o. female here for L/S sterilization  Pt here to discuss contraception . She is interested in permanent sterilization . She has tried  ocps , mirena  , nexplanon . No pelvic infections . No abd surgeries      Past Medical History:  has a past medical history of Anxiety, Depression, Hypertension, Insulin  controlled gestational diabetes mellitus (GDM) during pregnancy, antepartum (HHS-HCC) (05/10/2024), Migraines, and Nephrolithiasis.  Past Surgical History:  has a past surgical history that includes THROAT SURGERY; Tonsillectomy; and cystoscopy w/insertion/exchange ureteral stent (Left, 06/24/2022). Family History: family history includes Allergic rhinitis in her brother, father, and mother; Asthma in her brother, father, and mother; Coronary Artery Disease (Blocked arteries around heart) in her father; Diabetes type II in her maternal grandfather, paternal grandfather, and paternal grandmother; Gastric bypass surgery in her mother; High blood pressure (Hypertension) in her father, maternal grandfather, paternal grandfather, and paternal grandmother; Hyperlipidemia (Elevated cholesterol) in her father, maternal grandfather, paternal grandfather, and paternal grandmother; Myocardial Infarction (Heart attack) in her maternal grandfather and paternal grandfather; Myocardial Infarction (Heart attack) (age of onset: 100) in her father; Sudden cardiac death in her paternal grandfather. Social History:  reports that she has never smoked. She has never been exposed to tobacco smoke. She has never used smokeless tobacco. She reports that she does not currently use alcohol. She reports that she does not use drugs. OB/GYN History:  OB History       Gravida  3   Para  3   Term  3   Preterm      AB      Living  3        SAB      IAB      Ectopic      Molar      Multiple      Live Births  3             Allergies: is allergic to benzonatate. Medications:  Current  Medications    Current Outpatient Medications:    albuterol  MDI, PROVENTIL , VENTOLIN , PROAIR , HFA 90 mcg/actuation inhaler, INHALE 2 INHALATIONS INTO THE LUNGS EVERY 6 HOURS AS NEEDED FOR WHEEZING, Disp: 8.5 g, Rfl: 2   buPROPion (WELLBUTRIN XL) 300 MG XL tablet, Take 1 tablet (300 mg total) by mouth once daily, Disp: 90 tablet, Rfl: 3   escitalopram  oxalate (LEXAPRO ) 5 MG tablet, Take 2 tablets (10 mg total) by mouth once daily, Disp: 60 tablet, Rfl: 11   NIFEdipine  (ADALAT  CC) 30 MG ER tablet, Take 1 tablet (30 mg total) by mouth once daily for 30 days, Disp: 30 tablet, Rfl: 1   prenatal vitamin with iron -folic acid (PRENATAL TABLETS) tablet, Take 1 tablet by mouth once daily, Disp: , Rfl:    aspirin  81 MG EC tablet, Take 81 mg by mouth once daily (Patient not taking: Reported on 07/23/2024), Disp: , Rfl:    blood glucose diagnostic test strip, 1 each (1 strip total) 3 (three) times daily for 90 days Use as instructed. (Patient not taking: Reported on 07/23/2024), Disp: 100 each, Rfl: 2   blood glucose diagnostic test strip, 1 each (1 strip total) 4 (four) times daily Use as instructed. (Patient not taking: Reported on 07/23/2024), Disp: 100 each, Rfl: 6   blood glucose meter kit, as directed (Patient not taking: Reported on 07/23/2024), Disp: 1 each, Rfl: 0   blood-glucose sensor (DEXCOM G7 SENSOR) Devi, Use 1 each every 10 (ten) days for 90 days (Patient  not taking: Reported on 07/23/2024), Disp: 3 each, Rfl: 3   insulin  glargine (BASAGLAR TEMPO PEN,U-100,INSLN) 100 unit/mL (3 mL) inps, Inject 18 Units subcutaneously 2 (two) times daily 11 units in the AM and 11 units at bedtime (Patient not taking: Reported on 07/23/2024), Disp: 15 mL, Rfl: 3   labetaloL  (TRANDATE ) 100 MG tablet, Take 1 tablet (100 mg total) by mouth 3 (three) times a day for 14 days (Patient not taking: Reported on 06/26/2024), Disp: 42 tablet, Rfl: 0   labetaloL  (TRANDATE ) 200 MG tablet, Take 1 tablet (200 mg total) by mouth 3 (three)  times daily for 14 days (Patient not taking: Reported on 06/26/2024), Disp: 42 tablet, Rfl: 0   lancets, Use 1 each 4 (four) times daily for 7 days Use as instructed. (Patient not taking: Reported on 06/03/2024), Disp: 28 each, Rfl: 0   lancets, Use 1 each 4 (four) times daily Use as instructed. (Patient not taking: Reported on 07/23/2024), Disp: 100 each, Rfl: 6   nystatin -triamcinolone ointment, Apply small amount topically to nipples 4x/day for 10 days. Wipe off prior to latching infant. (Patient not taking: Reported on 07/23/2024), Disp: 30 g, Rfl: 2   pen needle, diabetic 31 gauge x 5/16 needle, Use as directed For 90 days (Patient not taking: Reported on 07/23/2024), Disp: 100 each, Rfl: 4   safety needles 25 gauge x 5/8 Ndle, Use to inject insulin  (Patient not taking: Reported on 07/23/2024), Disp: 100 each, Rfl: 11     Review of Systems: General:                      No fatigue or weight loss Eyes:                           No vision changes Ears:                            No hearing difficulty Respiratory:                No cough or shortness of breath Pulmonary:                  No asthma or shortness of breath Cardiovascular:           No chest pain, palpitations, dyspnea on exertion Gastrointestinal:          No abdominal bloating, chronic diarrhea, constipations, masses, pain or hematochezia Genitourinary:             No hematuria, dysuria, abnormal vaginal discharge, pelvic pain, Menometrorrhagia Lymphatic:                   No swollen lymph nodes Musculoskeletal:No muscle weakness Neurologic:                  No extremity weakness, syncope, seizure disorder Psychiatric:                  No history of depression, delusions or suicidal/homicidal ideation      Exam:       Vitals:    08/09/24  0957  BP: 119/62  Pulse: 86      Body mass index is 43.05 kg/m.   WDWN white/  female in NAD   Lungs: CTA  CV : RRR without murmur     Neck:  no thyromegaly Abdomen: soft , no  mass, normal active bowel  sounds,  non-tender, no rebound tenderness  Pelvic   : v/v nl cx : no CMT   Uterus NSSC  Adnexa: no mass Pelvic exam done Chaperone present   Impression:    The primary encounter diagnosis was Sterilization consult. A diagnosis of History of gestational diabetes mellitus (GDM) was also pertinent to this visit.       Plan:    After full discussion she will be schedule for L/S sterilization  Risks discussed see [facility] notes        Rhonda CLARYCE DINSMORE, MD

## 2024-08-15 ENCOUNTER — Other Ambulatory Visit: Payer: Self-pay

## 2024-08-15 ENCOUNTER — Encounter
Admission: RE | Admit: 2024-08-15 | Discharge: 2024-08-15 | Disposition: A | Discharge status: Home / Self Care | Source: Ambulatory Visit | Attending: Obstetrics and Gynecology | Admitting: Obstetrics and Gynecology

## 2024-08-15 VITALS — Ht 63.0 in | Wt 245.0 lb

## 2024-08-15 DIAGNOSIS — Z01812 Encounter for preprocedural laboratory examination: Secondary | ICD-10-CM

## 2024-08-15 HISTORY — DX: Gestational diabetes mellitus in pregnancy, unspecified control: O24.419

## 2024-08-15 HISTORY — DX: Personal history of urinary calculi: Z87.442

## 2024-08-15 HISTORY — DX: Vitamin D deficiency, unspecified: E55.9

## 2024-08-15 NOTE — Patient Instructions (Addendum)
 Your procedure is scheduled on: Friday, August 29 Report to the Registration Desk on the 1st floor of the [facility] To find out your arrival time, please call [phone] between 1PM - 3PM on: Thursday, August 28 If your arrival time is 6:00 am, do not arrive before that time as the [facility] entrance doors do not open until 6:00 am.  REMEMBER: Instructions that are not followed completely may result in serious medical risk, up to and including death; or upon the discretion of your surgeon and anesthesiologist your surgery may need to be rescheduled.  Do not eat food after midnight the night before surgery.  No gum chewing or hard candies.  You may however, drink CLEAR liquids up to 2 hours before you are scheduled to arrive for your surgery. Do not drink anything within 2 hours of your scheduled arrival time.  Clear liquids include: - water  - apple juice without pulp - gatorade (not RED colors) - black coffee or tea (Do NOT add milk or creamers to the coffee or tea) Do NOT drink anything that is not on this list.  In addition, your doctor has ordered for you to drink the provided:  Ensure Pre-Surgery Clear Carbohydrate Drink  Drinking this carbohydrate drink up to two hours before surgery helps to reduce insulin  resistance and improve patient outcomes. Please complete drinking 2 hours before scheduled arrival time.  One week prior to surgery: starting August 22 Stop Anti-inflammatories (NSAIDS) such as Advil , Aleve, Ibuprofen , Motrin , Naproxen, Naprosyn and Aspirin  based products such as Excedrin, Goody's Powder, BC Powder. Stop ANY OVER THE COUNTER supplements until after surgery.  You may however, continue to take Tylenol  if needed for pain up until the day of surgery.  Continue taking all of your other prescription medications up until the day of surgery.  ON THE DAY OF SURGERY ONLY TAKE THESE MEDICATIONS WITH SIPS OF WATER:  buPROPion (WELLBUTRIN XL)  busPIRone   (BUSPAR )  escitalopram  (LEXAPRO )   No Alcohol for 24 hours before or after surgery.  No Smoking including e-cigarettes for 24 hours before surgery.  No chewable tobacco products for at least 6 hours before surgery.  No nicotine  patches on the day of surgery.  Do not use any recreational drugs for at least a week (preferably 2 weeks) before your surgery.  Please be advised that the combination of cocaine and anesthesia may have negative outcomes, up to and including death. If you test positive for cocaine, your surgery will be cancelled.  On the morning of surgery brush your teeth with toothpaste and water, you may rinse your mouth with mouthwash if you wish. Do not swallow any toothpaste or mouthwash.  Use CHG wipes as directed on instruction sheet.  Do not wear jewelry, make-up, hairpins, clips or nail polish.  For welded (permanent) jewelry: bracelets, anklets, waist bands, etc.  Please have this removed prior to surgery.  If it is not removed, there is a chance that hospital personnel will need to cut it off on the day of surgery.  Do not wear lotions, powders, or perfumes.   Do not shave body hair from the neck down 48 hours before surgery.  Contact lenses, hearing aids and dentures may not be worn into surgery.  Do not bring valuables to the hospital. [facility] is not responsible for any missing/lost belongings or valuables.   Notify your doctor if there is any change in your medical condition (cold, fever, infection).  Wear comfortable clothing (specific to your  surgery type) to the hospital.  After surgery, you can help prevent lung complications by doing breathing exercises.  Take deep breaths and cough every 1-2 hours. Your doctor may order a device called an Incentive Spirometer to help you take deep breaths. When coughing or sneezing, hold a pillow firmly against your incision with both hands. This is called "splinting." Doing this helps protect your incision. It  also decreases belly discomfort.  If you are being discharged the day of surgery, you will not be allowed to drive home. You will need a responsible individual to drive you home and stay with you for 24 hours after surgery.   If you are taking public transportation, you will need to have a responsible individual with you.  Please call the Pre-admissions Testing Dept. at [phone] if you have any questions about these instructions.  Surgery Visitation Policy:  Patients having surgery or a procedure may have two visitors.  Children under the age of 35 must have an adult with them who is not the patient.   Merchandiser, retail to address health-related social needs:  [email or web address]    Preparing the Skin Before Surgery     To help prevent the risk of infection at your surgical site, we are now providing you with rinse-free Sage 2% Chlorhexidine Gluconate (CHG) disposable wipes.  Chlorhexidine Gluconate (CHG) Soap  o An antiseptic cleaner that kills germs and bonds with the skin to continue killing germs even after washing  o Used for showering the night before surgery and morning of surgery  The night before surgery: Shower or bathe with warm water. Do not apply perfume, lotions, powders. Wait one hour after shower. Skin should be dry and cool. Open Sage wipe package - use 6 disposable cloths. Wipe body using one cloth for the right arm, one cloth for the left arm, one cloth for the right leg, one cloth for the left leg, one cloth for the chest/abdomen area, and one cloth for the back. Do not use on open wounds or sores. Do not use on face or genitals (private parts). If you are breast feeding, do not use on breasts. 5. Do not rinse, allow to dry. 6. Skin may feel tacky for several minutes. 7. Dress in clean clothes. 8. Place clean sheets on your bed and do not sleep with pets.  REPEAT ABOVE ON THE MORNING OF SURGERY BEFORE ARRIVING TO THE  HOSPITAL.

## 2024-08-21 ENCOUNTER — Encounter
Admission: RE | Admit: 2024-08-21 | Discharge: 2024-08-21 | Disposition: A | Discharge status: Home / Self Care | Source: Ambulatory Visit | Attending: Obstetrics and Gynecology | Admitting: Obstetrics and Gynecology

## 2024-08-21 DIAGNOSIS — Z01812 Encounter for preprocedural laboratory examination: Secondary | ICD-10-CM | POA: Insufficient documentation

## 2024-08-21 DIAGNOSIS — Z6841 Body Mass Index (BMI) 40.0 and over, adult: Secondary | ICD-10-CM | POA: Insufficient documentation

## 2024-08-21 DIAGNOSIS — Z01818 Encounter for other preprocedural examination: Secondary | ICD-10-CM

## 2024-08-21 LAB — BASIC METABOLIC PANEL WITH GFR
Anion gap: 12 (ref 5–15)
BUN: 15 mg/dL (ref 6–20)
CO2: 26 mmol/L (ref 22–32)
Calcium: 9.9 mg/dL (ref 8.9–10.3)
Chloride: 103 mmol/L (ref 98–111)
Creatinine, Ser: 0.77 mg/dL (ref 0.44–1.00)
GFR, Estimated: >60 mL/min (ref >60)
Glucose, Bld: 124 mg/dL — ABNORMAL HIGH (ref 70–99)
Potassium: 3.9 mmol/L (ref 3.5–5.1)
Sodium: 141 mmol/L (ref 135–145)

## 2024-08-21 LAB — CBC
HCT: 37.6 % (ref 36.0–46.0)
Hemoglobin: 12.2 g/dL (ref 12.0–15.0)
MCH: 25.7 pg — ABNORMAL LOW (ref 26.0–34.0)
MCHC: 32.4 g/dL (ref 30.0–36.0)
MCV: 79.3 fL — ABNORMAL LOW (ref 80.0–100.0)
Platelets: 271 K/uL (ref 150–400)
RBC: 4.74 MIL/uL (ref 3.87–5.11)
RDW: 14.6 % (ref 11.5–15.5)
WBC: 6 K/uL (ref 4.0–10.5)
nRBC: 0 % (ref 0.0–0.2)

## 2024-08-22 LAB — TYPE AND SCREEN
ABO/RH(D): A NEG
Antibody Screen: POSITIVE
Extend sample reason: UNDETERMINED

## 2024-08-23 ENCOUNTER — Ambulatory Visit: Payer: Self-pay | Admitting: Urgent Care

## 2024-08-23 ENCOUNTER — Ambulatory Visit
Admission: RE | Admit: 2024-08-23 | Discharge: 2024-08-23 | Disposition: A | Discharge status: Home / Self Care | Attending: Obstetrics and Gynecology | Admitting: Obstetrics and Gynecology

## 2024-08-23 ENCOUNTER — Encounter: Payer: Self-pay | Admitting: Obstetrics and Gynecology

## 2024-08-23 ENCOUNTER — Other Ambulatory Visit: Payer: Self-pay

## 2024-08-23 ENCOUNTER — Encounter
Admission: RE | Disposition: A | Discharge status: Home / Self Care | Payer: Self-pay | Source: Home / Self Care | Attending: Obstetrics and Gynecology

## 2024-08-23 ENCOUNTER — Ambulatory Visit: Admitting: Certified Registered"

## 2024-08-23 DIAGNOSIS — E669 Obesity, unspecified: Secondary | ICD-10-CM | POA: Diagnosis not present

## 2024-08-23 DIAGNOSIS — Z8632 Personal history of gestational diabetes: Secondary | ICD-10-CM | POA: Diagnosis not present

## 2024-08-23 DIAGNOSIS — Z01818 Encounter for other preprocedural examination: Secondary | ICD-10-CM

## 2024-08-23 DIAGNOSIS — Z6841 Body Mass Index (BMI) 40.0 and over, adult: Secondary | ICD-10-CM | POA: Insufficient documentation

## 2024-08-23 DIAGNOSIS — F32A Depression, unspecified: Secondary | ICD-10-CM | POA: Insufficient documentation

## 2024-08-23 DIAGNOSIS — I10 Essential (primary) hypertension: Secondary | ICD-10-CM | POA: Insufficient documentation

## 2024-08-23 DIAGNOSIS — Z7982 Long term (current) use of aspirin: Secondary | ICD-10-CM | POA: Insufficient documentation

## 2024-08-23 DIAGNOSIS — Z302 Encounter for sterilization: Secondary | ICD-10-CM | POA: Diagnosis present

## 2024-08-23 DIAGNOSIS — Z79899 Other long term (current) drug therapy: Secondary | ICD-10-CM | POA: Diagnosis not present

## 2024-08-23 DIAGNOSIS — F419 Anxiety disorder, unspecified: Secondary | ICD-10-CM | POA: Diagnosis not present

## 2024-08-23 DIAGNOSIS — Z833 Family history of diabetes mellitus: Secondary | ICD-10-CM | POA: Diagnosis not present

## 2024-08-23 HISTORY — PX: LAPAROSCOPIC BILATERAL SALPINGECTOMY: SHX5889

## 2024-08-23 LAB — POCT PREGNANCY, URINE: Preg Test, Ur: NEGATIVE

## 2024-08-23 SURGERY — SALPINGECTOMY, BILATERAL, LAPAROSCOPIC
Anesthesia: General | Site: Pelvis | Laterality: Bilateral

## 2024-08-23 MED ORDER — DEXAMETHASONE SODIUM PHOSPHATE 10 MG/ML IJ SOLN
INTRAMUSCULAR | Status: DC | PRN
  Administered 2024-08-23: 10 mg via INTRAVENOUS

## 2024-08-23 MED ORDER — ONDANSETRON HCL 4 MG/2ML IJ SOLN
INTRAMUSCULAR | Status: DC | PRN
  Administered 2024-08-23: 4 mg via INTRAVENOUS

## 2024-08-23 MED ORDER — ORAL CARE MOUTH RINSE: 15.0000 mL | Freq: Once | OROMUCOSAL | Status: AC

## 2024-08-23 MED ORDER — MIDAZOLAM HCL 2 MG/2ML IJ SOLN
INTRAMUSCULAR | Status: DC | PRN
  Administered 2024-08-23: 2 mg via INTRAVENOUS

## 2024-08-23 MED ORDER — LIDOCAINE HCL (CARDIAC) PF 100 MG/5ML IV SOSY
PREFILLED_SYRINGE | INTRAVENOUS | Status: DC | PRN
  Administered 2024-08-23: 80 mg via INTRAVENOUS

## 2024-08-23 MED ORDER — ONDANSETRON HCL 4 MG/2ML IJ SOLN
INTRAMUSCULAR | Status: AC
  Filled 2024-08-23: qty 2

## 2024-08-23 MED ORDER — ACETAMINOPHEN 500 MG PO TABS
1000.0000 mg | ORAL_TABLET | ORAL | Status: AC
  Administered 2024-08-23: 1000 mg via ORAL

## 2024-08-23 MED ORDER — SILVER NITRATE-POT NITRATE 75-25 % EX MISC
CUTANEOUS | Status: AC
  Filled 2024-08-23: qty 10

## 2024-08-23 MED ORDER — PHENYLEPHRINE 80 MCG/ML (10ML) SYRINGE FOR IV PUSH (FOR BLOOD PRESSURE SUPPORT)
PREFILLED_SYRINGE | INTRAVENOUS | Status: AC
  Filled 2024-08-23: qty 10

## 2024-08-23 MED ORDER — OXYCODONE HCL 5 MG PO TABS
5.0000 mg | ORAL_TABLET | Freq: Once | ORAL | Status: AC | PRN
  Administered 2024-08-23: 5 mg via ORAL

## 2024-08-23 MED ORDER — FENTANYL CITRATE (PF) 100 MCG/2ML IJ SOLN
INTRAMUSCULAR | Status: AC
  Filled 2024-08-23: qty 2

## 2024-08-23 MED ORDER — OXYCODONE HCL 5 MG/5ML PO SOLN: 5.0000 mg | Freq: Once | ORAL | Status: AC | PRN

## 2024-08-23 MED ORDER — OXYCODONE HCL 5 MG PO TABS
ORAL_TABLET | ORAL | Status: AC
  Filled 2024-08-23: qty 1

## 2024-08-23 MED ORDER — HYDROMORPHONE HCL 1 MG/ML IJ SOLN
0.2500 mg | INTRAMUSCULAR | Status: DC | PRN
  Administered 2024-08-23 (×2): 0.25 mg via INTRAVENOUS

## 2024-08-23 MED ORDER — PROPOFOL 500 MG/50ML IV EMUL
INTRAVENOUS | Status: DC | PRN
  Administered 2024-08-23: 150 ug/kg/min via INTRAVENOUS

## 2024-08-23 MED ORDER — ROCURONIUM BROMIDE 10 MG/ML (PF) SYRINGE
PREFILLED_SYRINGE | INTRAVENOUS | Status: AC
  Filled 2024-08-23: qty 10

## 2024-08-23 MED ORDER — SUGAMMADEX SODIUM 200 MG/2ML IV SOLN
INTRAVENOUS | Status: DC | PRN
  Administered 2024-08-23: 300 mg via INTRAVENOUS

## 2024-08-23 MED ORDER — DEXMEDETOMIDINE HCL IN NACL 80 MCG/20ML IV SOLN
INTRAVENOUS | Status: AC
  Filled 2024-08-23: qty 20

## 2024-08-23 MED ORDER — CHLORHEXIDINE GLUCONATE 0.12 % MT SOLN
OROMUCOSAL | Status: AC
  Filled 2024-08-23: qty 15

## 2024-08-23 MED ORDER — BUPIVACAINE HCL (PF) 0.5 % IJ SOLN
INTRAMUSCULAR | Status: AC
  Filled 2024-08-23: qty 30

## 2024-08-23 MED ORDER — BUPIVACAINE HCL 0.5 % IJ SOLN
INTRAMUSCULAR | Status: DC | PRN
  Administered 2024-08-23: 10 mL

## 2024-08-23 MED ORDER — FENTANYL CITRATE (PF) 100 MCG/2ML IJ SOLN
INTRAMUSCULAR | Status: DC | PRN
  Administered 2024-08-23: 50 ug via INTRAVENOUS
  Administered 2024-08-23: 25 ug via INTRAVENOUS
  Administered 2024-08-23: 50 ug via INTRAVENOUS

## 2024-08-23 MED ORDER — DEXAMETHASONE SODIUM PHOSPHATE 10 MG/ML IJ SOLN
INTRAMUSCULAR | Status: AC
Start: 2024-08-23 — End: 2024-08-23
  Filled 2024-08-23: qty 1

## 2024-08-23 MED ORDER — POVIDONE-IODINE 10 % EX SWAB
2.0000 | Freq: Once | CUTANEOUS | Status: AC
  Administered 2024-08-23: 2 via TOPICAL

## 2024-08-23 MED ORDER — LACTATED RINGERS IV SOLN: INTRAVENOUS | Status: DC

## 2024-08-23 MED ORDER — MIDAZOLAM HCL 2 MG/2ML IJ SOLN
INTRAMUSCULAR | Status: AC
  Filled 2024-08-23: qty 2

## 2024-08-23 MED ORDER — HYDROMORPHONE HCL 1 MG/ML IJ SOLN
INTRAMUSCULAR | Status: AC
  Filled 2024-08-23: qty 1

## 2024-08-23 MED ORDER — PROPOFOL 10 MG/ML IV BOLUS
INTRAVENOUS | Status: AC
  Filled 2024-08-23: qty 20

## 2024-08-23 MED ORDER — PROPOFOL 10 MG/ML IV BOLUS
INTRAVENOUS | Status: DC | PRN
  Administered 2024-08-23: 200 mg via INTRAVENOUS

## 2024-08-23 MED ORDER — ACETAMINOPHEN 500 MG PO TABS
ORAL_TABLET | ORAL | Status: AC
  Filled 2024-08-23: qty 2

## 2024-08-23 MED ORDER — KETOROLAC TROMETHAMINE 30 MG/ML IJ SOLN
INTRAMUSCULAR | Status: DC | PRN
  Administered 2024-08-23: 30 mg via INTRAVENOUS

## 2024-08-23 MED ORDER — 0.9 % SODIUM CHLORIDE (POUR BTL) OPTIME
TOPICAL | Status: DC | PRN
  Administered 2024-08-23: 500 mL

## 2024-08-23 MED ORDER — LIDOCAINE HCL (PF) 2 % IJ SOLN
INTRAMUSCULAR | Status: AC
  Filled 2024-08-23: qty 5

## 2024-08-23 MED ORDER — PROPOFOL 1000 MG/100ML IV EMUL
INTRAVENOUS | Status: AC
  Filled 2024-08-23: qty 100

## 2024-08-23 MED ORDER — ROCURONIUM BROMIDE 100 MG/10ML IV SOLN
INTRAVENOUS | Status: DC | PRN
  Administered 2024-08-23: 60 mg via INTRAVENOUS

## 2024-08-23 MED ORDER — CHLORHEXIDINE GLUCONATE 0.12 % MT SOLN
15.0000 mL | Freq: Once | OROMUCOSAL | Status: AC
  Administered 2024-08-23: 15 mL via OROMUCOSAL

## 2024-08-23 MED ORDER — DEXMEDETOMIDINE HCL IN NACL 80 MCG/20ML IV SOLN
INTRAVENOUS | Status: DC | PRN
  Administered 2024-08-23: 4 ug via INTRAVENOUS
  Administered 2024-08-23 (×2): 8 ug via INTRAVENOUS

## 2024-08-23 MED ORDER — PHENYLEPHRINE 80 MCG/ML (10ML) SYRINGE FOR IV PUSH (FOR BLOOD PRESSURE SUPPORT)
PREFILLED_SYRINGE | INTRAVENOUS | Status: DC | PRN
  Administered 2024-08-23: 80 ug via INTRAVENOUS

## 2024-08-23 MED ORDER — KETOROLAC TROMETHAMINE 30 MG/ML IJ SOLN
INTRAMUSCULAR | Status: AC
  Filled 2024-08-23: qty 1

## 2024-08-23 SURGICAL SUPPLY — 41 items
BAG URINE DRAIN 2000ML AR STRL (UROLOGICAL SUPPLIES) IMPLANT
BENZOIN TINCTURE PRP APPL 2/3 (GAUZE/BANDAGES/DRESSINGS) ×1 IMPLANT
BLADE SURG SZ11 CARB STEEL (BLADE) ×1 IMPLANT
CATH ROBINSON RED A/P 16FR (CATHETERS) ×1 IMPLANT
CATH URTH 16FR FL 2W BLN LF (CATHETERS) ×1 IMPLANT
CHLORAPREP W/TINT 26 (MISCELLANEOUS) ×1 IMPLANT
DRSG TEGADERM 2-3/8X2-3/4 SM (GAUZE/BANDAGES/DRESSINGS) ×1 IMPLANT
GAUZE 4X4 16PLY ~~LOC~~+RFID DBL (SPONGE) ×1 IMPLANT
GAUZE SPONGE 2X2 STRL 8-PLY (GAUZE/BANDAGES/DRESSINGS) ×1 IMPLANT
GLOVE SURG SYN 8.0 PF PI (GLOVE) ×2 IMPLANT
GOWN STRL REUS W/ TWL LRG LVL3 (GOWN DISPOSABLE) ×1 IMPLANT
GOWN STRL REUS W/ TWL XL LVL3 (GOWN DISPOSABLE) ×2 IMPLANT
GRASPER SUT TROCAR 14GX15 (MISCELLANEOUS) ×1 IMPLANT
IRRIGATION STRYKERFLOW (MISCELLANEOUS) ×1 IMPLANT
IV NS 1000ML BAXH (IV SOLUTION) ×1 IMPLANT
KIT PINK PAD W/HEAD ARM REST (MISCELLANEOUS) ×1 IMPLANT
KIT TURNOVER CYSTO (KITS) ×1 IMPLANT
KITTNER LAPARASCOPIC 5X40 (MISCELLANEOUS) IMPLANT
LABEL OR SOLS (LABEL) ×1 IMPLANT
MANIFOLD NEPTUNE II (INSTRUMENTS) ×1 IMPLANT
NS IRRIG 500ML POUR BTL (IV SOLUTION) ×1 IMPLANT
PACK GYN LAPAROSCOPIC (MISCELLANEOUS) ×1 IMPLANT
PAD OB MATERNITY 11 LF (PERSONAL CARE ITEMS) ×1 IMPLANT
PAD PREP OB/GYN DISP 24X41 (PERSONAL CARE ITEMS) ×1 IMPLANT
POWDER SURGICEL 3.0 GRAM (HEMOSTASIS) IMPLANT
SCISSORS LAP 5X35 DISP (ENDOMECHANICALS) IMPLANT
SET TUBE SMOKE EVAC HIGH FLOW (TUBING) ×1 IMPLANT
SHEARS HARMONIC 36 ACE (MISCELLANEOUS) ×1 IMPLANT
SLEEVE Z-THREAD 5X100MM (TROCAR) ×1 IMPLANT
SOLUTION PREP PVP 2OZ (MISCELLANEOUS) ×1 IMPLANT
STRIP CLOSURE SKIN 1/4X4 (GAUZE/BANDAGES/DRESSINGS) ×1 IMPLANT
SUT VIC AB 0 CT1 36 (SUTURE) ×1 IMPLANT
SUT VIC AB 2-0 UR6 27 (SUTURE) IMPLANT
SUT VIC AB 4-0 SH 27XANBCTRL (SUTURE) ×1 IMPLANT
SYR 50ML LL SCALE MARK (SYRINGE) IMPLANT
SYSTEM BAG RETRIEVAL 10MM (BASKET) ×1 IMPLANT
TIP ENDOSCOPIC SURGICEL (TIP) IMPLANT
TRAP FLUID SMOKE EVACUATOR (MISCELLANEOUS) ×1 IMPLANT
TROCAR Z-THRD FIOS HNDL 11X100 (TROCAR) ×1 IMPLANT
TROCAR Z-THREAD FIOS 5X100MM (TROCAR) ×1 IMPLANT
WATER STERILE IRR 500ML POUR (IV SOLUTION) ×1 IMPLANT

## 2024-08-23 NOTE — Anesthesia Preprocedure Evaluation (Signed)
 Anesthesia Evaluation  Patient identified by MRN, date of birth, ID band Patient awake    Reviewed: Allergy & Precautions, NPO status , Patient's Chart, lab work & pertinent test results  History of Anesthesia Complications Negative for: history of anesthetic complications  Airway Mallampati: III  TM Distance: >3 FB Neck ROM: full    Dental no notable dental hx.    Pulmonary neg pulmonary ROS   Pulmonary exam normal        Cardiovascular negative cardio ROS Normal cardiovascular exam     Neuro/Psych  Headaches PSYCHIATRIC DISORDERS Anxiety Depression       GI/Hepatic negative GI ROS, Neg liver ROS,,,  Endo/Other    Class 3 obesity  Renal/GU      Musculoskeletal   Abdominal   Peds  Hematology negative hematology ROS (+)   Anesthesia Other Findings Past Medical History: No date: GAD (generalized anxiety disorder) No date: Gestational diabetes mellitus 01/27/2016: H/O chlamydia infection     Comment:  Chlamydia infection 12/2015, treated.   12/14/2016: H/O trichomoniasis 03/2021: History of cervical dysplasia     Comment:  CIN 1 12/2021: History of COVID-19 No date: History of kidney stones No date: History of pregnancy induced hypertension 04/2024: Insulin  controlled gestational diabetes mellitus (GDM)  during pregnancy, antepartum No date: Left ureteral calculus No date: MDD (major depressive disorder) No date: Migraines No date: Vitamin D deficiency No date: Wears glasses  Past Surgical History: 01/17/2012: CYST EXCISION     Comment:  @[facility] ;   anterior neck  (benign epidermal) 06/24/2022: CYSTOSCOPY/URETEROSCOPY/HOLMIUM LASER/STENT PLACEMENT; Left     Comment:  Procedure: CYSTOSCOPY/LEFT URETEROSCOPY/HOLMIUM LASER/               LEFT RETROGRADE PYELOGRAM/STONE EXTRACTION/  LEFT STENT               PLACEMENT;  Surgeon: Watt Rush, MD;  Location: [facility];  Service:  Urology;  Laterality:               Left; 05/16/2022: EXTRACORPOREAL SHOCK WAVE LITHOTRIPSY; Left     Comment:  Procedure: EXTRACORPOREAL SHOCK WAVE LITHOTRIPSY (ESWL);              Surgeon: Watt Rush, MD;  Location: [facility];  Service: Urology;  Laterality: Left; 2007: TONSILLECTOMY AND ADENOIDECTOMY  BMI    Body Mass Index: 43.93 kg/m      Reproductive/Obstetrics negative OB ROS                              Anesthesia Physical Anesthesia Plan  ASA: 3  Anesthesia Plan: General ETT   Post-op Pain Management: Toradol  IV (intra-op)*, Tylenol  PO (pre-op)* and Dilaudid  IV   Induction: Intravenous  PONV Risk Score and Plan: 3 and Ondansetron , Dexamethasone , Midazolam  and Treatment may vary due to age or medical condition  Airway Management Planned: Oral ETT  Additional Equipment:   Intra-op Plan:   Post-operative Plan: Extubation in OR  Informed Consent: I have reviewed the patients History and Physical, chart, labs and discussed the procedure including the risks, benefits and alternatives for the proposed anesthesia with the patient or authorized representative who has indicated his/her understanding and acceptance.     Dental Advisory Given  Plan Discussed with: Anesthesiologist, CRNA and Surgeon  Anesthesia Plan Comments: (Patient consented for risks of anesthesia including but not limited to:  - adverse reactions to medications - damage to eyes, teeth, lips or other oral mucosa - nerve damage due to positioning  - sore throat or hoarseness - Damage to heart, brain, nerves, lungs, other parts of body or loss of life  Patient voiced understanding and assent.)        Anesthesia Quick Evaluation

## 2024-08-23 NOTE — Anesthesia Procedure Notes (Signed)
 Procedure Name: Intubation Date/Time: 08/23/2024 7:43 AM  Performed by: Lennie Lamarr HERO, CRNAPre-anesthesia Checklist: Patient identified, Emergency Drugs available, Suction available and Patient being monitored Patient Re-evaluated:Patient Re-evaluated prior to induction Oxygen Delivery Method: Circle System Utilized Preoxygenation: Pre-oxygenation with 100% oxygen Induction Type: IV induction Ventilation: Mask ventilation without difficulty Laryngoscope Size: McGrath and 4 Grade View: Grade I Tube type: Oral Tube size: 7.0 mm Number of attempts: 1 Airway Equipment and Method: Stylet and Oral airway Placement Confirmation: ETT inserted through vocal cords under direct vision, positive ETCO2 and breath sounds checked- equal and bilateral Secured at: 21 cm Tube secured with: Tape Dental Injury: Teeth and Oropharynx as per pre-operative assessment

## 2024-08-23 NOTE — Anesthesia Postprocedure Evaluation (Signed)
 Anesthesia Post Note  Patient: Rhonda Davies  Procedure(s) Performed: SALPINGECTOMY, BILATERAL, LAPAROSCOPIC (Bilateral: Pelvis)  Patient location during evaluation: PACU Anesthesia Type: General Level of consciousness: awake and alert Pain management: pain level controlled Vital Signs Assessment: post-procedure vital signs reviewed and stable Respiratory status: spontaneous breathing, nonlabored ventilation, respiratory function stable and patient connected to nasal cannula oxygen Cardiovascular status: blood pressure returned to baseline and stable Postop Assessment: no apparent nausea or vomiting Anesthetic complications: no   No notable events documented.   Last Vitals:  Vitals:   08/23/24 1005 08/23/24 1016  BP: 95/60 100/64  Pulse: 65   Resp: 18 18  Temp: (!) 36.1 C   SpO2: 98% 98%    Last Pain:  Vitals:   08/23/24 1005  TempSrc: Oral  PainSc: 4                  Lendia LITTIE Mae

## 2024-08-23 NOTE — Progress Notes (Signed)
 Pt here for L/S sterilization . Neg HCG . Labs reviewed . All questions answered  proceed

## 2024-08-23 NOTE — Transfer of Care (Signed)
 Immediate Anesthesia Transfer of Care Note  Patient: Rhonda Davies  Procedure(s) Performed: SALPINGECTOMY, BILATERAL, LAPAROSCOPIC (Bilateral: Pelvis)  Patient Location: PACU  Anesthesia Type:General  Level of Consciousness: drowsy and patient cooperative  Airway & Oxygen Therapy: Patient Spontanous Breathing and Patient connected to face mask oxygen  Post-op Assessment: Report given to RN, Post -op Vital signs reviewed and stable, and Patient moving all extremities X 4  Post vital signs: Reviewed and stable  Last Vitals:  Vitals Value Taken Time  BP 103/57 08/23/24 08:42  Temp 36.4 C 08/23/24 08:42  Pulse 87 08/23/24 08:45  Resp 18 08/23/24 08:45  SpO2 100 % 08/23/24 08:45  Vitals shown include unfiled device data.  Last Pain:  Vitals:   08/23/24 0842  TempSrc:   PainSc: Asleep         Complications: No notable events documented.

## 2024-08-23 NOTE — Brief Op Note (Signed)
 08/23/2024  8:33 AM  PATIENT:  Rhonda Davies  33 y.o. female  PRE-OPERATIVE DIAGNOSIS:  elective sterilization  POST-OPERATIVE DIAGNOSIS:  elective sterilization  PROCEDURE:  Procedure(s) with comments: SALPINGECTOMY, BILATERAL, LAPAROSCOPIC (Bilateral) - Laparscopic Sterilization  SURGEON:  Surgeons and Role:    * Gareld Obrecht, Debby PARAS, MD - Primary  PHYSICIAN ASSISTANT:   ASSISTANTS: cst   ANESTHESIA:   general  EBL:  minimal  IOF 800 cc uo 75cc  BLOOD ADMINISTERED:none  DRAINS: none   LOCAL MEDICATIONS USED:  MARCAINE      SPECIMEN:  Source of Specimen:  bilateral fallopian tubes  DISPOSITION OF SPECIMEN:  PATHOLOGY  COUNTS:  YES  TOURNIQUET:  * No tourniquets in log *  DICTATION: .Other Dictation: Dictation Number verbal  PLAN OF CARE: Discharge to home after PACU  PATIENT DISPOSITION:  PACU - hemodynamically stable.   Delay start of Pharmacological VTE agent (>24hrs) due to surgical blood loss or risk of bleeding: not applicable

## 2024-08-23 NOTE — Op Note (Signed)
 Rhonda Davies, Rhonda Davies MEDICAL RECORD NO: [id number] ACCOUNT NO: [id number] DATE OF BIRTH: January 07, 1991 FACILITY: [facility] LOCATION: [facility] PHYSICIAN: Debby DOROTHA Dinsmore, MD  Operative Report   DATE OF PROCEDURE: 08/23/2024   PREOPERATIVE DIAGNOSIS: Elective permanent sterilization.  POSTOPERATIVE DIAGNOSIS: Elective permanent sterilization.  PROCEDURE: Laparoscopic bilateral salpingectomy.  SURGEON: Debby DOROTHA Dinsmore, MD  ANESTHESIA: General endotracheal anesthesia.  INDICATIONS: 33 year old gravida 3, para 3. The patient has elected for permanent sterilization.  DESCRIPTION OF PROCEDURE: After adequate general endotracheal anesthesia, the patient was placed in the dorsal supine position with the legs in the [place] stirrups. Timeout was performed. A Foley catheter was placed into the bladder for the duration of  the procedure and a sponge stick was placed in the vagina to be used for uterine manipulation during the procedure. Gloves and gown were changed. Attention was directed to the patient's abdomen where a 5 mm infraumbilical incision was made after  injecting with Marcaine . A 5 mm laparoscope was advanced into the abdominal cavity under direct visualization with the Optiview cannula. The patient's abdomen was insufflated. Second port placement left lower quadrant 3 cm medial to the left anterior  iliac spine. A 5-mm trocar was advanced under direct visualization. A third port was placed in the right lower quadrant 3 cm medial to the right anterior iliac spine. The patient was placed in Trendelenburg. The left fallopian tube was grasped with the  contralateral grasper and a harmonic scalpel was used to dissect the fallopian tube from the mesosalpinx to the level of the cornua. The fallopian tube was removed through the left lower port site. A similar procedure was repeated on the right fallopian  tube again grasping the distal portion of the fallopian tube. The fallopian tube  was dissected free from the mesosalpinx with the Harmonic scalpel. The fallopian tube was removed through the right port site. Intraabdominal pressure was lowered to 7 mmHg.  Good hemostasis was noted. The patient's abdomen was deflated. All instruments were removed and all incisions were closed with a single 4-0 Vicryl suture and sterile dressing applied. The Foley catheter was removed and sponges were removed. There were  no complications.  ESTIMATED BLOOD LOSS: Minimal.  INTRAOPERATIVE FLUIDS: 800 mL.  URINE OUTPUT: 75 mL.  The patient did receive 30 mg of intravenous Toradol  at the end of the procedure.  DISPOSITION: The patient was taken to the recovery room in good condition.    [email or web address] D: 08/23/2024 8:42:32 am T: 08/23/2024 11:45:00 pm  JOB: [id number]/ [id number]

## 2024-08-24 ENCOUNTER — Encounter: Payer: Self-pay | Admitting: Obstetrics and Gynecology

## 2024-08-27 LAB — SURGICAL PATHOLOGY
